# Patient Record
Sex: Female | Born: 1985 | Race: Black or African American | Hispanic: No | Marital: Single | Smoking: Never smoker
Health system: Southern US, Community
[De-identification: ages and names within clinical notes are randomized; demographics above are authoritative.]

## PROBLEM LIST (undated history)

## (undated) ENCOUNTER — Inpatient Hospital Stay (HOSPITAL_COMMUNITY): Payer: Self-pay

## (undated) DIAGNOSIS — O139 Gestational [pregnancy-induced] hypertension without significant proteinuria, unspecified trimester: Secondary | ICD-10-CM

## (undated) DIAGNOSIS — R002 Palpitations: Secondary | ICD-10-CM

## (undated) DIAGNOSIS — D649 Anemia, unspecified: Secondary | ICD-10-CM

## (undated) DIAGNOSIS — G35D Multiple sclerosis, unspecified: Secondary | ICD-10-CM

## (undated) DIAGNOSIS — I10 Essential (primary) hypertension: Secondary | ICD-10-CM

## (undated) DIAGNOSIS — G35 Multiple sclerosis: Secondary | ICD-10-CM

## (undated) HISTORY — PX: NO PAST SURGERIES: SHX2092

---
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
## 2011-08-21 ENCOUNTER — Encounter (HOSPITAL_COMMUNITY): Payer: Self-pay | Admitting: *Deleted

## 2011-08-21 ENCOUNTER — Emergency Department (HOSPITAL_COMMUNITY)
Admission: EM | Admit: 2011-08-21 | Discharge: 2011-08-21 | Disposition: A | Discharge status: Home / Self Care | Payer: Self-pay | Attending: Emergency Medicine | Admitting: Emergency Medicine

## 2011-08-21 ENCOUNTER — Emergency Department (HOSPITAL_COMMUNITY): Payer: Self-pay

## 2011-08-21 DIAGNOSIS — R0602 Shortness of breath: Secondary | ICD-10-CM | POA: Insufficient documentation

## 2011-08-21 DIAGNOSIS — R079 Chest pain, unspecified: Secondary | ICD-10-CM | POA: Insufficient documentation

## 2011-08-21 DIAGNOSIS — K219 Gastro-esophageal reflux disease without esophagitis: Secondary | ICD-10-CM | POA: Insufficient documentation

## 2011-08-21 DIAGNOSIS — R1013 Epigastric pain: Secondary | ICD-10-CM | POA: Insufficient documentation

## 2011-08-21 HISTORY — DX: Essential (primary) hypertension: I10

## 2011-08-21 LAB — CBC WITH DIFFERENTIAL/PLATELET
Eosinophils Relative: 1 % (ref 0–5)
Hemoglobin: 11 g/dL — ABNORMAL LOW (ref 12.0–15.0)
Lymphocytes Relative: 23 % (ref 12–46)
Lymphs Abs: 1.5 10*3/uL (ref 0.7–4.0)
MCV: 84.3 fL (ref 78.0–100.0)
Monocytes Relative: 13 % — ABNORMAL HIGH (ref 3–12)
Neutrophils Relative %: 63 % (ref 43–77)
Platelets: 309 10*3/uL (ref 150–400)
RBC: 3.88 MIL/uL (ref 3.87–5.11)
WBC: 6.3 10*3/uL (ref 4.0–10.5)

## 2011-08-21 LAB — COMPREHENSIVE METABOLIC PANEL
ALT: 10 U/L (ref 0–35)
Alkaline Phosphatase: 59 U/L (ref 39–117)
CO2: 27 mEq/L (ref 19–32)
GFR calc Af Amer: >90 mL/min (ref >90)
GFR calc non Af Amer: >90 mL/min (ref >90)
Glucose, Bld: 91 mg/dL (ref 70–99)
Potassium: 3.6 mEq/L (ref 3.5–5.1)
Sodium: 138 mEq/L (ref 135–145)

## 2011-08-21 MED ORDER — RANITIDINE HCL 150 MG PO TABS: 150.0000 mg | ORAL_TABLET | Freq: Two times a day (BID) | ORAL | Status: DC

## 2011-08-21 MED ORDER — GI COCKTAIL ~~LOC~~
30.0000 mL | Freq: Once | ORAL | Status: AC
  Administered 2011-08-21: 30 mL via ORAL
  Filled 2011-08-21: qty 30

## 2011-08-21 NOTE — ED Notes (Addendum)
Pt reports shortness of breath, sore throat x couple of days. Worse with exertion. Denies cough. Pt speaking in complete sentences, no distress noted. No hx of asthma.

## 2011-08-21 NOTE — ED Provider Notes (Addendum)
History     CSN: [id number]  Arrival date & time 08/21/11  1520   First MD Initiated Contact with Patient 08/21/11 1620      Chief Complaint  Patient presents with  . Shortness of Breath    (Consider location/radiation/quality/duration/timing/severity/associated sxs/prior treatment) Patient is a 26 y.o. female presenting with shortness of breath. The history is provided by the patient.  Shortness of Breath  The current episode started 2 days ago. The onset was gradual. The problem occurs occasionally. The problem has been unchanged. The problem is moderate. Nothing relieves the symptoms. The symptoms are aggravated by a supine position (worse in the morning when she gets up in the morning). Associated symptoms include shortness of breath. Pertinent negatives include no cough and no wheezing. Associated symptoms comments: midsternal burning. Her past medical history does not include asthma or past wheezing. Urine output has been normal. There were no sick contacts. She has received no recent medical care.    History reviewed. No pertinent past medical history.  History reviewed. No pertinent past surgical history.  No family history on file.  History  Substance Use Topics  . Smoking status: Never Smoker   . Smokeless tobacco: Not on file  . Alcohol Use: Yes    OB History    Grav Para Term Preterm Abortions TAB SAB Ect Mult Living                  Review of Systems  Respiratory: Positive for shortness of breath. Negative for cough and wheezing.   Genitourinary: Positive for vaginal bleeding.       Started menses today.  States she has very heavy bleeding.  All other systems reviewed and are negative.    Allergies  Review of patient's allergies indicates no known allergies.  Home Medications   Current Outpatient Rx  Name Route Sig Dispense Refill  . ASPIRIN-ACETAMINOPHEN-CAFFEINE 250-250-65 MG PO TABS Oral Take 1 tablet by mouth every 6 (six) hours as needed. For  pain      BP 141/90  Pulse 92  Temp 98.5 F (36.9 C) (Oral)  Resp 16  SpO2 98%  Physical Exam  Nursing note and vitals reviewed. Constitutional: She is oriented to person, place, and time. She appears well-developed and well-nourished. No distress.  HENT:  Head: Normocephalic and atraumatic.  Mouth/Throat: No oropharyngeal exudate, posterior oropharyngeal edema or posterior oropharyngeal erythema.  Eyes: EOM are normal. Pupils are equal, round, and reactive to light.  Cardiovascular: Normal rate, regular rhythm, normal heart sounds and intact distal pulses.  Exam reveals no friction rub.   No murmur heard. Pulmonary/Chest: Effort normal and breath sounds normal. She has no wheezes. She has no rales.  Abdominal: Soft. Bowel sounds are normal. She exhibits no distension. There is tenderness in the epigastric area. There is no rebound and no guarding.       Mild epigastric tenderness  Musculoskeletal: Normal range of motion. She exhibits no tenderness.       No edema  Neurological: She is alert and oriented to person, place, and time. No cranial nerve deficit.  Skin: Skin is warm and dry. No rash noted.  Psychiatric: She has a normal mood and affect. Her behavior is normal.    ED Course  Procedures (including critical care time)  Labs Reviewed  CBC WITH DIFFERENTIAL - Abnormal; Notable for the following:    Hemoglobin 11.0 (*)     HCT 32.7 (*)     Monocytes Relative 13 (*)  All other components within normal limits  COMPREHENSIVE METABOLIC PANEL - Abnormal; Notable for the following:    Total Bilirubin 0.2 (*)     All other components within normal limits   Dg Chest 2 View  08/21/2011   *RADIOLOGY REPORT*  Clinical Data: Chest pain  CHEST - 2 VIEW  Comparison:  None.  Findings:  The heart size and mediastinal contours are within normal limits.  Both lungs are clear.  The visualized skeletal structures are unremarkable.  IMPRESSION: No active cardiopulmonary disease.   Original Report Authenticated By: Judie Petit. Ruel Favors, M.D.     Date: 08/21/2011  Rate: 73  Rhythm: normal sinus rhythm  QRS Axis: normal  Intervals: normal  ST/T Wave abnormalities: normal  Conduction Disutrbances: none  Narrative Interpretation: unremarkable      1. GERD (gastroesophageal reflux disease)       MDM   Pt with sx most consistent with acid reflux.  He started 2 days ago and it is worse in the morning she has and a bad taste in her mouth when she wakes up. The pain will intermittently, shortness of breath and she feels that there's a lump in her throat. She does complain of some mild congestion but no other infectious symptoms. No true sore throat and no signs of pharyngitis on exam. She is in no distress and has normal vital signs. Mild epigastric tenderness but no right upper quadrant or left upper quadrant tenderness.  Patient also states that she is very heavy menses which may also be the cause of her shortness of breath. CBC, CMP, EKG, chest x-ray pending. Patient given a GI cocktail.  6:14 PM Labs wnl.  Pt feeling much better.  Pt d/ced home with zantac and GERD percautions.      Gwyneth Sprout, MD 08/21/11 1610  Gwyneth Sprout, MD 08/21/11 [id number]

## 2011-12-26 ENCOUNTER — Encounter (HOSPITAL_COMMUNITY): Payer: Self-pay | Admitting: Emergency Medicine

## 2011-12-26 ENCOUNTER — Emergency Department (HOSPITAL_COMMUNITY): Payer: Self-pay

## 2011-12-26 ENCOUNTER — Emergency Department (HOSPITAL_COMMUNITY)
Admission: EM | Admit: 2011-12-26 | Discharge: 2011-12-26 | Disposition: A | Discharge status: Home / Self Care | Payer: Self-pay | Attending: Emergency Medicine | Admitting: Emergency Medicine

## 2011-12-26 DIAGNOSIS — Z79899 Other long term (current) drug therapy: Secondary | ICD-10-CM | POA: Insufficient documentation

## 2011-12-26 DIAGNOSIS — S62629A Displaced fracture of medial phalanx of unspecified finger, initial encounter for closed fracture: Secondary | ICD-10-CM

## 2011-12-26 DIAGNOSIS — IMO0002 Reserved for concepts with insufficient information to code with codable children: Secondary | ICD-10-CM | POA: Insufficient documentation

## 2011-12-26 DIAGNOSIS — W2209XA Striking against other stationary object, initial encounter: Secondary | ICD-10-CM | POA: Insufficient documentation

## 2011-12-26 DIAGNOSIS — I10 Essential (primary) hypertension: Secondary | ICD-10-CM | POA: Insufficient documentation

## 2011-12-26 DIAGNOSIS — Y939 Activity, unspecified: Secondary | ICD-10-CM | POA: Insufficient documentation

## 2011-12-26 DIAGNOSIS — Y929 Unspecified place or not applicable: Secondary | ICD-10-CM | POA: Insufficient documentation

## 2011-12-26 DIAGNOSIS — Z7982 Long term (current) use of aspirin: Secondary | ICD-10-CM | POA: Insufficient documentation

## 2011-12-26 NOTE — ED Notes (Signed)
Ortho called 

## 2011-12-26 NOTE — ED Notes (Signed)
Pt states 2 days ago she struck her left hand on the edge of the table and it has progressively gotten worse over the past 2 days  Pt is unable to move her middle finger on the left hand

## 2011-12-26 NOTE — ED Provider Notes (Signed)
History     CSN: [id number]  Arrival date & time 12/26/11  8657   First MD Initiated Contact with Patient 12/26/11 [phone]      Chief Complaint  Patient presents with  . Hand Injury    (Consider location/radiation/quality/duration/timing/severity/associated sxs/prior treatment) HPI Comments: Amanda Wise presents for evaluation of pain and swelling of Amanda Wise left middle finger.  She states she was swinging Amanda Wise arm and inadvertently struck the side of the finger against a hard table edge 2 days ago.  She initially thought it was just a superficial bruise however the pain and swelling have persisted.  She denies any other injuries and states the pain is limited to the finger and specifically to the area around the middle joint of the finger.  Patient is a 26 y.o. female presenting with hand injury. The history is provided by the patient. No language interpreter was used.  Hand Injury  The incident occurred 2 days ago. The incident occurred at home. The injury mechanism was a direct blow. The pain is present in the left fingers (middle finger). The quality of the pain is described as throbbing and sharp. The pain is at a severity of 10/10. The pain is severe (localized). Pertinent negatives include no fever and no malaise/fatigue. She reports no foreign bodies present. The symptoms are aggravated by movement, use and palpation. She has tried nothing for the symptoms.    Past Medical History  Diagnosis Date  . Hypertension     History reviewed. No pertinent past surgical history.  Family History  Problem Relation Age of Onset  . Hypertension Other     History  Substance Use Topics  . Smoking status: Never Smoker   . Smokeless tobacco: Not on file  . Alcohol Use: Yes    OB History    Grav Para Term Preterm Abortions TAB SAB Ect Mult Living                  Review of Systems  Constitutional: Negative for fever and malaise/fatigue.  All other systems reviewed and are  negative.    Allergies  Review of patient's allergies indicates no known allergies.  Home Medications   Current Outpatient Rx  Name  Route  Sig  Dispense  Refill  . ACETAMINOPHEN 500 MG PO TABS   Oral   Take 1,000 mg by mouth every 6 (six) hours as needed. Pain         . ASPIRIN-ACETAMINOPHEN-CAFFEINE 250-250-65 MG PO TABS   Oral   Take 1 tablet by mouth every 6 (six) hours as needed. For pain         . RANITIDINE HCL 150 MG PO TABS   Oral   Take 150 mg by mouth 2 (two) times daily.           BP 138/82  Pulse 69  Temp 98.1 F (36.7 C) (Oral)  Resp 16  SpO2 99%  LMP 12/02/2011  Physical Exam  Nursing note and vitals reviewed. Constitutional: She appears well-developed and well-nourished. No distress.  HENT:  Head: Normocephalic and atraumatic.  Right Ear: External ear normal.  Left Ear: External ear normal.  Mouth/Throat: Oropharynx is clear and moist.  Eyes: Conjunctivae normal are normal. Pupils are equal, round, and reactive to light. Right eye exhibits no discharge. Left eye exhibits no discharge. No scleral icterus.  Neck: Neck supple. No JVD present. No tracheal deviation present.  Cardiovascular: Normal rate, regular rhythm, normal heart sounds and intact distal pulses.  Exam reveals no gallop and no friction rub.   No murmur heard. Pulmonary/Chest: Breath sounds normal. No respiratory distress. She has no wheezes. She has no rales.  Abdominal: Soft. Bowel sounds are normal. There is no tenderness.  Musculoskeletal: She exhibits tenderness.       Left hand: She exhibits decreased range of motion, tenderness, bony tenderness and swelling. She exhibits normal capillary refill and no laceration. Decreased sensation is not present in the ulnar distribution, is not present in the medial redistribution and is not present in the radial distribution.       Hands:      Tenderness and swelling at the left proximal interphalangeal joint.  No obvious deformity.   Diminished ROM observed secondary to pain and swelling.  No tenderness appreciated of the distal or proximal finger.  Neurological: She is alert.  Skin: Skin is warm and dry. No rash noted. She is not diaphoretic. No erythema. No pallor.  Psychiatric: She has a normal mood and affect. Amanda Wise behavior is normal.    ED Course  Procedures (including critical care time)  Labs Reviewed - No data to display No results found.   No diagnosis found.    MDM  Pt presents with pain and swelling of Amanda Wise left middle finger after striking it on the edge of a table.  She has the most swelling and pain at the proximal interphalangeal joint.  Will obtain x-rays of the finger to assess for fracture or dislocation.  There are no skin breaks, fluctuant areas, sausage-like digital swelling, pain with palpation over the extensor or flexor compartments of the forearm, or cellulitis-like skin changes.  0930.  Pt stable, NAD.  Note small avulsion fracture of the medial base of the 3rd middle phalanx.  This corresponds to the tenderness encountered on exam.  Will have a splint placed, Plan outpt follow-up with ortho/hand specialist.     Tobin Chad, MD 12/26/11 [phone]

## 2012-02-28 ENCOUNTER — Encounter (INDEPENDENT_AMBULATORY_CARE_PROVIDER_SITE_OTHER): Payer: BC Managed Care – PPO | Admitting: Ophthalmology

## 2012-02-28 DIAGNOSIS — S0510XA Contusion of eyeball and orbital tissues, unspecified eye, initial encounter: Secondary | ICD-10-CM

## 2012-02-28 DIAGNOSIS — H43819 Vitreous degeneration, unspecified eye: Secondary | ICD-10-CM

## 2012-02-28 DIAGNOSIS — H31009 Unspecified chorioretinal scars, unspecified eye: Secondary | ICD-10-CM

## 2012-06-09 ENCOUNTER — Emergency Department (HOSPITAL_COMMUNITY)
Admission: EM | Admit: 2012-06-09 | Discharge: 2012-06-09 | Disposition: A | Discharge status: Home / Self Care | Payer: BC Managed Care – PPO | Attending: Emergency Medicine | Admitting: Emergency Medicine

## 2012-06-09 ENCOUNTER — Encounter (HOSPITAL_COMMUNITY): Payer: Self-pay | Admitting: Emergency Medicine

## 2012-06-09 DIAGNOSIS — R002 Palpitations: Secondary | ICD-10-CM | POA: Insufficient documentation

## 2012-06-09 DIAGNOSIS — I10 Essential (primary) hypertension: Secondary | ICD-10-CM | POA: Insufficient documentation

## 2012-06-09 DIAGNOSIS — Z79899 Other long term (current) drug therapy: Secondary | ICD-10-CM | POA: Insufficient documentation

## 2012-06-09 LAB — POCT I-STAT, CHEM 8
Calcium, Ion: 1.14 mmol/L (ref 1.12–1.23)
HCT: 34 % — ABNORMAL LOW (ref 36.0–46.0)
Hemoglobin: 11.6 g/dL — ABNORMAL LOW (ref 12.0–15.0)
TCO2: 26 mmol/L (ref 0–100)

## 2012-06-09 LAB — POCT I-STAT TROPONIN I: Troponin i, poc: 0 ng/mL (ref 0.00–0.08)

## 2012-06-09 NOTE — ED Provider Notes (Signed)
Medical screening examination/treatment/procedure(s) were performed by non-physician practitioner and as supervising physician I was immediately available for consultation/collaboration.  Doug Sou, MD 06/09/12 [phone]

## 2012-06-09 NOTE — ED Notes (Signed)
Pt c/o chest and shoulder pain that started two day ago. Pt now sts pain is radiating down left arm with palpations.

## 2012-06-09 NOTE — ED Provider Notes (Signed)
History     CSN: [id number]  Arrival date & time 06/09/12  0422   First MD Initiated Contact with Patient 06/09/12 0440      Chief Complaint  Patient presents with  . Chest Pain    (Consider location/radiation/quality/duration/timing/severity/associated sxs/prior treatment) HPI Comments: Amanda Wise is a 27 y.o. obese female with a history of hypertension presents emergency department complaining of palpitations.  Onset of symptoms began today and lasted approximately 5 minutes.  Patient has had a total of 3 or 4 episodes.  She is not currently having any palpitations.  She denies any syncope, lightheadedness, dizziness, chest pain, shortness of breath, leg swelling.  In addition patient reports that she's had a nonproductive cough times one week.  Brother has similar symptoms.  She denies fevers, night sweats, chills.  Lastly patient reports intermittent left arm pain that is described as left shoulder muscular soreness.  Patient denies any trauma, numbness, tingling or weakness of extremity.  She reports that she thinks she slept wrong a while back and it has bothered her since.   Past Medical History  Diagnosis Date  . Hypertension     History reviewed. No pertinent past surgical history.  Family History  Problem Relation Age of Onset  . Hypertension Other     History  Substance Use Topics  . Smoking status: Never Smoker   . Smokeless tobacco: Not on file  . Alcohol Use: No    OB History   Grav Para Term Preterm Abortions TAB SAB Ect Mult Living                  Review of Systems  All other systems reviewed and are negative.    Allergies  Review of patient's allergies indicates no known allergies.  Home Medications   Current Outpatient Rx  Name  Route  Sig  Dispense  Refill  . acetaminophen (TYLENOL) 500 MG tablet   Oral   Take 1,000 mg by mouth every 6 (six) hours as needed. Pain         . aspirin-acetaminophen-caffeine (EXCEDRIN MIGRAINE) 250-250-65  MG per tablet   Oral   Take 1 tablet by mouth every 6 (six) hours as needed. For pain         . ranitidine (ZANTAC) 150 MG tablet   Oral   Take 150 mg by mouth 2 (two) times daily.           BP 143/83  Pulse 80  Temp(Src) 98.6 F (37 C) (Oral)  Resp 18  Ht 5\' 6"  (1.676 m)  Wt 300 lb (136.079 kg)  BMI 48.44 kg/m2  SpO2 97%  LMP 05/19/2012  Physical Exam  Nursing note and vitals reviewed. Constitutional: She appears well-developed and well-nourished. No distress.  HENT:  Head: Normocephalic and atraumatic.  Eyes: Conjunctivae and EOM are normal. Pupils are equal, round, and reactive to light.  Neck: Normal range of motion. Neck supple. Normal carotid pulses and no JVD present. Carotid bruit is not present. No rigidity. Normal range of motion present.  Cardiovascular: Normal rate, regular rhythm, S1 normal, S2 normal, normal heart sounds, intact distal pulses and normal pulses.  Exam reveals no gallop and no friction rub.   No murmur heard. No pitting edema bilaterally, RRR, no aberrant sounds on auscultations, distal pulses intact, no carotid bruit or JVD.   Pulmonary/Chest: Effort normal and breath sounds normal. No accessory muscle usage or stridor. No respiratory distress. She exhibits no tenderness and no bony tenderness.  Abdominal: Bowel sounds are normal.  Morbidly obese. Soft non tender. Non pulsatile aorta.   Skin: Skin is warm, dry and intact. No rash noted. She is not diaphoretic. No cyanosis. Nails show no clubbing.    ED Course  Procedures (including critical care time)  Labs Reviewed  POCT I-STAT, CHEM 8 - Abnormal; Notable for the following:    Hemoglobin 11.6 (*)    HCT 34.0 (*)    All other components within normal limits  POCT I-STAT TROPONIN I   No results found.  Date: 06/09/2012  Rate: 55  Rhythm: normal sinus rhythm  QRS Axis: normal  Intervals: normal  ST/T Wave abnormalities: normal  Conduction Disutrbances: none  Narrative  Interpretation:   Old EKG Reviewed: No old  No diagnosis found.    MDM  Palpitations 27 year old female presents emergency room complaining of intermittent palpitations.  EKG reviewed with no ST elevation, troponin and chem 8 within normal limits.  Chest pain free throughout the emergency department visit.  Patient denies risk factors including early family cardiac, smoking, and cocaine use. Low concern for ACS etiology.  Patient advised to followup with primary care Dr. for today's presentation.  Discuss possible Holter monitor to catch a regular rhythm his symptoms persist.  Strict return precautions discussed.  Patient verbalizes understanding and agrees with plan.       Jaci Carrel, [place] 06/09/12 [phone]

## 2014-02-13 ENCOUNTER — Emergency Department (HOSPITAL_COMMUNITY)
Admission: EM | Admit: 2014-02-13 | Discharge: 2014-02-13 | Disposition: A | Discharge status: Home / Self Care | Payer: Self-pay | Attending: Emergency Medicine | Admitting: Emergency Medicine

## 2014-02-13 ENCOUNTER — Emergency Department (HOSPITAL_COMMUNITY): Payer: Self-pay

## 2014-02-13 ENCOUNTER — Encounter (HOSPITAL_COMMUNITY): Payer: Self-pay | Admitting: Emergency Medicine

## 2014-02-13 DIAGNOSIS — Z3202 Encounter for pregnancy test, result negative: Secondary | ICD-10-CM | POA: Insufficient documentation

## 2014-02-13 DIAGNOSIS — Z79899 Other long term (current) drug therapy: Secondary | ICD-10-CM | POA: Insufficient documentation

## 2014-02-13 DIAGNOSIS — I10 Essential (primary) hypertension: Secondary | ICD-10-CM | POA: Insufficient documentation

## 2014-02-13 DIAGNOSIS — R0602 Shortness of breath: Secondary | ICD-10-CM | POA: Insufficient documentation

## 2014-02-13 DIAGNOSIS — R200 Anesthesia of skin: Secondary | ICD-10-CM | POA: Insufficient documentation

## 2014-02-13 LAB — CBC WITH DIFFERENTIAL/PLATELET
Basophils Absolute: 0 K/uL (ref 0.0–0.1)
Basophils Relative: 1 % (ref 0–1)
Eosinophils Absolute: 0.2 K/uL (ref 0.0–0.7)
Eosinophils Relative: 2 % (ref 0–5)
HCT: 33.7 % — ABNORMAL LOW (ref 36.0–46.0)
Hemoglobin: 10.9 g/dL — ABNORMAL LOW (ref 12.0–15.0)
Lymphocytes Relative: 31 % (ref 12–46)
Lymphs Abs: 2.1 K/uL (ref 0.7–4.0)
MCH: 27.7 pg (ref 26.0–34.0)
MCHC: 32.3 g/dL (ref 30.0–36.0)
MCV: 85.8 fL (ref 78.0–100.0)
Monocytes Absolute: 0.6 K/uL (ref 0.1–1.0)
Monocytes Relative: 9 % (ref 3–12)
Neutro Abs: 3.9 K/uL (ref 1.7–7.7)
Neutrophils Relative %: 57 % (ref 43–77)
Platelets: 292 K/uL (ref 150–400)
RBC: 3.93 MIL/uL (ref 3.87–5.11)
RDW: 14.5 % (ref 11.5–15.5)
WBC: 6.8 K/uL (ref 4.0–10.5)

## 2014-02-13 LAB — URINALYSIS, ROUTINE W REFLEX MICROSCOPIC
Bilirubin Urine: NEGATIVE
Glucose, UA: NEGATIVE mg/dL
Ketones, ur: NEGATIVE mg/dL
LEUKOCYTES UA: NEGATIVE
Nitrite: NEGATIVE
PROTEIN: NEGATIVE mg/dL
SPECIFIC GRAVITY, URINE: 1.034 — AB (ref 1.005–1.030)
UROBILINOGEN UA: 1 mg/dL (ref 0.0–1.0)
pH: 6 (ref 5.0–8.0)

## 2014-02-13 LAB — COMPREHENSIVE METABOLIC PANEL WITH GFR
ALT: 20 U/L (ref 0–35)
AST: 25 U/L (ref 0–37)
Albumin: 4 g/dL (ref 3.5–5.2)
Alkaline Phosphatase: 62 U/L (ref 39–117)
Anion gap: 6 (ref 5–15)
BUN: 13 mg/dL (ref 6–23)
CO2: 24 mmol/L (ref 19–32)
Calcium: 8.9 mg/dL (ref 8.4–10.5)
Chloride: 109 meq/L (ref 96–112)
Creatinine, Ser: 0.86 mg/dL (ref 0.50–1.10)
GFR calc Af Amer: >90 mL/min
GFR calc non Af Amer: 90 mL/min — ABNORMAL LOW
Glucose, Bld: 89 mg/dL (ref 70–99)
Potassium: 4.2 mmol/L (ref 3.5–5.1)
Sodium: 139 mmol/L (ref 135–145)
Total Bilirubin: 0.4 mg/dL (ref 0.3–1.2)
Total Protein: 8.1 g/dL (ref 6.0–8.3)

## 2014-02-13 LAB — URINE MICROSCOPIC-ADD ON

## 2014-02-13 LAB — TSH: TSH: 0.562 u[IU]/mL (ref 0.350–4.500)

## 2014-02-13 LAB — PREGNANCY, URINE: Preg Test, Ur: NEGATIVE

## 2014-02-13 NOTE — ED Provider Notes (Signed)
CSN: [id number]     Arrival date & time 02/13/14  0347 History   First MD Initiated Contact with Patient 02/13/14 0355     Chief Complaint  Patient presents with  . Shortness of Breath  . Numbness    both legs    The history is provided by the patient.   patient presents complaining of shortness of breath and intermittent numbness of her bilateral lower extremities.  She denies swelling of her lower extremities.  She reports that the shortness of breath is intermittent as well.  She denies orthopnea.  No current dyspnea.  Reports no dyspnea on exertion.  Reports her main reason for coming in was when she was at work this evening she felt tingling and numbness in both of her legs and this concerned her.  She does not have a primary care physician.  She denies fevers or chills.  No productive cough.  No history DVT or pulmonary embolism.  Symptoms are mild and intermittent.  Currently she is asymptomatic.  She feels stronger legs and denies weakness.  Past Medical History  Diagnosis Date  . Hypertension    History reviewed. No pertinent past surgical history. Family History  Problem Relation Age of Onset  . Hypertension Other    History  Substance Use Topics  . Smoking status: Never Smoker   . Smokeless tobacco: Not on file  . Alcohol Use: No   OB History    No data available     Review of Systems  All other systems reviewed and are negative.     Allergies  Review of patient's allergies indicates no known allergies.  Home Medications   Prior to Admission medications   Medication Sig Start Date End Date Taking? Authorizing Provider  acetaminophen (TYLENOL) 500 MG tablet Take 1,000 mg by mouth every 6 (six) hours as needed. Pain   Yes Historical Provider, MD  Multiple Vitamin (MULTIVITAMIN WITH MINERALS) TABS tablet Take 1 tablet by mouth daily.   Yes Historical Provider, MD  ferrous sulfate 325 (65 FE) MG tablet Take 325 mg by mouth daily with breakfast.    Historical  Provider, MD   BP 145/91 mmHg  Pulse 80  Temp(Src) 98.3 F (36.8 C) (Oral)  Resp 13  Ht  (1.702 m)  Wt 315 lb (142.883 kg)  BMI 49.32 kg/m2  SpO2 100%  LMP 01/30/2014 Physical Exam  Constitutional: She is oriented to person, place, and time. She appears well-developed and well-nourished. No distress.  HENT:  Head: Normocephalic and atraumatic.  Eyes: EOM are normal.  Neck: Normal range of motion.  Cardiovascular: Normal rate, regular rhythm and normal heart sounds.   Pulmonary/Chest: Effort normal and breath sounds normal.  Abdominal: Soft. She exhibits no distension. There is no tenderness.  Musculoskeletal: Normal range of motion. She exhibits no edema or tenderness.  Normal PT and DP pulses bilaterally.  Full range of motion bilateral ankles knees and hips.  Neurological: She is alert and oriented to person, place, and time.  Skin: Skin is warm and dry.  Psychiatric: She has a normal mood and affect. Judgment normal.  Nursing note and vitals reviewed.   ED Course  Procedures (including critical care time) Labs Review Labs Reviewed  CBC WITH DIFFERENTIAL - Abnormal; Notable for the following:    Hemoglobin 10.9 (*)    HCT 33.7 (*)    All other components within normal limits  COMPREHENSIVE METABOLIC PANEL - Abnormal; Notable for the following:    GFR calc  non Af Amer 90 (*)    All other components within normal limits  URINALYSIS, ROUTINE W REFLEX MICROSCOPIC - Abnormal; Notable for the following:    APPearance CLOUDY (*)    Specific Gravity, Urine 1.034 (*)    Hgb urine dipstick MODERATE (*)    All other components within normal limits  URINE MICROSCOPIC-ADD ON - Abnormal; Notable for the following:    Squamous Epithelial / LPF MANY (*)    Bacteria, UA MANY (*)    All other components within normal limits  PREGNANCY, URINE  TSH    Imaging Review Dg Chest 2 View  02/13/2014   CLINICAL DATA:  Shortness of breath and numbness to both lower leg starting at  0145 hr this morning. Numbness and tingling in both legs. Shortness of breath for 2 weeks. Nonsmoker.  EXAM: CHEST  2 VIEW  COMPARISON:  08/21/2011  FINDINGS: Shallow inspiration with segmental elevation of the left hemidiaphragm. Normal heart size and pulmonary vascularity. No focal airspace disease or consolidation in the lungs. No blunting of costophrenic angles. No pneumothorax. Mediastinal contours appear intact. No change since prior study.  IMPRESSION: No active cardiopulmonary disease.   Electronically Signed   By: Burman Nieves M.D.   On: 02/13/2014 06:53  I personally reviewed the imaging tests through PACS system I reviewed available ER/hospitalization records through the EMR    EKG Interpretation   Date/Time:  Saturday February 13 2014 04:20:58 EST Ventricular Rate:  87 PR Interval:  169 QRS Duration: 91 QT Interval:  349 QTC Calculation: 420 R Axis:   37 Text Interpretation:  Sinus rhythm Borderline T abnormalities, inferior  leads No significant change was found Confirmed by Wm Fruchter  MD, Aylan Bayona  ([id number]) on 02/13/2014 6:58:36 AM      MDM   Final diagnoses:  SOB (shortness of breath)    Overall the patient is well-appearing.  Patient is PERC negative.  Normal strength in her bilateral lower extremities.  Recommended to follow-up with the primary care physician.  Resources given.    Lyanne Co, MD 02/14/14 [facility]ers

## 2014-02-13 NOTE — Discharge Instructions (Signed)

## 2014-02-13 NOTE — ED Notes (Signed)
Patient transported to X-ray 

## 2014-02-13 NOTE — ED Notes (Signed)
Pt. Came in to ED POV with complaint of SOB and numbness of bil. Lower legs which started at 0145 this morning while talking her break from work. Pt. Stated that she was walking in their break room and felt tingling and numbness on both legs, denied pain. Denies N/V.

## 2014-08-26 LAB — OB RESULTS CONSOLE GC/CHLAMYDIA
CHLAMYDIA, DNA PROBE: NEGATIVE
GC PROBE AMP, GENITAL: NEGATIVE

## 2014-08-27 LAB — OB RESULTS CONSOLE RPR: RPR: NONREACTIVE

## 2014-08-27 LAB — OB RESULTS CONSOLE HIV ANTIBODY (ROUTINE TESTING): HIV: NONREACTIVE

## 2014-08-27 LAB — OB RESULTS CONSOLE RUBELLA ANTIBODY, IGM: RUBELLA: IMMUNE

## 2014-08-27 LAB — OB RESULTS CONSOLE HEPATITIS B SURFACE ANTIGEN: HEP B S AG: NEGATIVE

## 2014-11-30 ENCOUNTER — Other Ambulatory Visit (HOSPITAL_COMMUNITY): Payer: Self-pay | Admitting: Obstetrics and Gynecology

## 2014-11-30 DIAGNOSIS — IMO0002 Reserved for concepts with insufficient information to code with codable children: Secondary | ICD-10-CM

## 2014-11-30 DIAGNOSIS — Z0489 Encounter for examination and observation for other specified reasons: Secondary | ICD-10-CM

## 2014-12-07 ENCOUNTER — Ambulatory Visit (HOSPITAL_COMMUNITY)
Admission: RE | Admit: 2014-12-07 | Discharge: 2014-12-07 | Disposition: A | Discharge status: Home / Self Care | Payer: Medicaid Other | Source: Ambulatory Visit | Attending: Obstetrics and Gynecology | Admitting: Obstetrics and Gynecology

## 2014-12-07 DIAGNOSIS — Z0489 Encounter for examination and observation for other specified reasons: Secondary | ICD-10-CM

## 2014-12-07 DIAGNOSIS — IMO0002 Reserved for concepts with insufficient information to code with codable children: Secondary | ICD-10-CM

## 2014-12-07 DIAGNOSIS — Z3A23 23 weeks gestation of pregnancy: Secondary | ICD-10-CM | POA: Diagnosis not present

## 2014-12-07 DIAGNOSIS — Z36 Encounter for antenatal screening of mother: Secondary | ICD-10-CM | POA: Insufficient documentation

## 2014-12-07 DIAGNOSIS — O99210 Obesity complicating pregnancy, unspecified trimester: Secondary | ICD-10-CM | POA: Insufficient documentation

## 2015-01-10 ENCOUNTER — Inpatient Hospital Stay (HOSPITAL_COMMUNITY)
Admission: AD | Admit: 2015-01-10 | Discharge: 2015-01-10 | Disposition: A | Discharge status: Home / Self Care | Payer: Medicaid Other | Source: Ambulatory Visit | Attending: Obstetrics and Gynecology | Admitting: Obstetrics and Gynecology

## 2015-01-10 DIAGNOSIS — O368130 Decreased fetal movements, third trimester, not applicable or unspecified: Secondary | ICD-10-CM | POA: Insufficient documentation

## 2015-01-10 DIAGNOSIS — Z3A29 29 weeks gestation of pregnancy: Secondary | ICD-10-CM | POA: Insufficient documentation

## 2015-01-10 NOTE — Discharge Instructions (Signed)

## 2015-01-10 NOTE — MAU Provider Note (Signed)
History    Amanda Wise is a 28y.o. G1P0 at 29.5wks who presents, unannounced, for no fetal movement.  Patient states she last detected movement around 1900.  Patient reports eating pasta, fruit juice, and zebra cakes with no movement.  Patient further reports resting on left side and drinking ice water with no results.  Patient states she reported to MAU at this time.  Patient denies LOF, VB, and ctx.    There are no active problems to display for this patient.   No chief complaint on file.  HPI  OB History    No data available      Past Medical History  Diagnosis Date  . Hypertension     No past surgical history on file.  Family History  Problem Relation Age of Onset  . Hypertension Other     Social History  Substance Use Topics  . Smoking status: Never Smoker   . Smokeless tobacco: Not on file  . Alcohol Use: No    Allergies: No Known Allergies  Prescriptions prior to admission  Medication Sig Dispense Refill Last Dose  . acetaminophen (TYLENOL) 500 MG tablet Take 1,000 mg by mouth every 6 (six) hours as needed. Pain   Past Month at Unknown time  . ferrous sulfate 325 (65 FE) MG tablet Take 325 mg by mouth daily with breakfast.   unknown at unknown  . Multiple Vitamin (MULTIVITAMIN WITH MINERALS) TABS tablet Take 1 tablet by mouth daily.   02/12/2014 at Unknown time    ROS  See HPI Above Physical Exam   Blood pressure 134/72, pulse 103, temperature 97.9 F (36.6 C), temperature source Oral, resp. rate 16, height 5\' 7"  (1.702 m), weight 151.048 kg (333 lb).  No results found for this or any previous visit (from the past 24 hour(s)).  Physical Exam  Vitals reviewed. Constitutional: She is oriented to person, place, and time. She appears well-developed and well-nourished.  Obese   HENT:  Head: Normocephalic and atraumatic.  Eyes: EOM are normal. Pupils are equal, round, and reactive to light.  Neck: Normal range of motion.  Cardiovascular: Normal rate.    Respiratory: Effort normal.  GI: Soft.  Musculoskeletal: Normal range of motion.  Neurological: She is alert and oriented to person, place, and time.  Skin: Skin is warm and dry.     FHR:145 bpm, Mod Var, -Decels, -Accels UC: Ocassional  ED Course  Assessment: IUP at 29.5wks Cat I FT Decreased FM  Plan: -Reassuring FHT -Difficult to trace to due maternal habitus -Patient informed that steps prior to presenting was appropriate -Keep appt as scheduled: 01/11/2015 -Encouraged to call if any questions or concerns arise prior to next scheduled office visit.  -Discharged to home in stable condition  Cherre Robins CNM, MSN 01/10/2015 1:58 AM

## 2015-01-10 NOTE — MAU Note (Signed)
Pt ate dinner at 1800, had a stressful day at work. Did not feel any fetal movement since that time.  Denies any vag bleeding or discharge.

## 2015-02-06 NOTE — L&D Delivery Note (Addendum)
Delivery Note At 2:47 PM a viable female, "Aviana", was delivered via Vaginal, Spontaneous Delivery (Presentation: Right Occiput Anterior).  APGAR: 9, 9; weight 7 lb 4.2 oz (3294 g).   Placenta status: Intact, Spontaneous.  Cord: 3 vessels with the following complications: None.  Cord pH: NA  Anesthesia: Epidural--placed just prior to delivery, with minimal benefit. Episiotomy: None Lacerations: Right periurethral and 2nd degree perineal Suture Repair: 3.0 vicryl Est. Blood Loss (mL): 150  Mom to postpartum.  Baby to Couplet care / Skin to Skin. Will continue SCDs until ambulating freely.  Nigel Bridgeman 03/06/2015, 4:39 PM

## 2015-02-22 LAB — OB RESULTS CONSOLE GBS: GBS: POSITIVE

## 2015-03-03 ENCOUNTER — Inpatient Hospital Stay (HOSPITAL_COMMUNITY)
Admission: AD | Admit: 2015-03-03 | Discharge: 2015-03-08 | DRG: 775 | Disposition: A | Discharge status: Home / Self Care | Payer: Medicaid Other | Source: Intra-hospital | Attending: Obstetrics & Gynecology | Admitting: Obstetrics & Gynecology

## 2015-03-03 ENCOUNTER — Encounter (HOSPITAL_COMMUNITY): Payer: Self-pay | Admitting: *Deleted

## 2015-03-03 ENCOUNTER — Inpatient Hospital Stay (HOSPITAL_COMMUNITY): Payer: Medicaid Other

## 2015-03-03 DIAGNOSIS — O99214 Obesity complicating childbirth: Secondary | ICD-10-CM | POA: Diagnosis present

## 2015-03-03 DIAGNOSIS — Z3A37 37 weeks gestation of pregnancy: Secondary | ICD-10-CM

## 2015-03-03 DIAGNOSIS — O9A213 Injury, poisoning and certain other consequences of external causes complicating pregnancy, third trimester: Secondary | ICD-10-CM

## 2015-03-03 DIAGNOSIS — B951 Streptococcus, group B, as the cause of diseases classified elsewhere: Secondary | ICD-10-CM | POA: Diagnosis present

## 2015-03-03 DIAGNOSIS — O9902 Anemia complicating childbirth: Secondary | ICD-10-CM | POA: Diagnosis present

## 2015-03-03 DIAGNOSIS — Z6841 Body Mass Index (BMI) 40.0 and over, adult: Secondary | ICD-10-CM

## 2015-03-03 DIAGNOSIS — O289 Unspecified abnormal findings on antenatal screening of mother: Principal | ICD-10-CM | POA: Diagnosis present

## 2015-03-03 DIAGNOSIS — O368390 Maternal care for abnormalities of the fetal heart rate or rhythm, unspecified trimester, not applicable or unspecified: Secondary | ICD-10-CM | POA: Diagnosis present

## 2015-03-03 DIAGNOSIS — O288 Other abnormal findings on antenatal screening of mother: Secondary | ICD-10-CM

## 2015-03-03 DIAGNOSIS — R102 Pelvic and perineal pain: Secondary | ICD-10-CM | POA: Diagnosis present

## 2015-03-03 DIAGNOSIS — Z9181 History of falling: Secondary | ICD-10-CM

## 2015-03-03 DIAGNOSIS — O99213 Obesity complicating pregnancy, third trimester: Secondary | ICD-10-CM

## 2015-03-03 DIAGNOSIS — R600 Localized edema: Secondary | ICD-10-CM | POA: Diagnosis present

## 2015-03-03 DIAGNOSIS — O99824 Streptococcus B carrier state complicating childbirth: Secondary | ICD-10-CM | POA: Diagnosis present

## 2015-03-03 DIAGNOSIS — Z8249 Family history of ischemic heart disease and other diseases of the circulatory system: Secondary | ICD-10-CM

## 2015-03-03 DIAGNOSIS — T1490XA Injury, unspecified, initial encounter: Secondary | ICD-10-CM

## 2015-03-03 HISTORY — DX: Anemia, unspecified: D64.9

## 2015-03-03 LAB — TYPE AND SCREEN
ABO/RH(D): A POS
Antibody Screen: NEGATIVE

## 2015-03-03 LAB — CBC
HCT: 33.7 % — ABNORMAL LOW (ref 36.0–46.0)
HEMOGLOBIN: 11.5 g/dL — AB (ref 12.0–15.0)
MCH: 30.3 pg (ref 26.0–34.0)
MCHC: 34.1 g/dL (ref 30.0–36.0)
MCV: 88.7 fL (ref 78.0–100.0)
Platelets: 249 10*3/uL (ref 150–400)
RBC: 3.8 MIL/uL — ABNORMAL LOW (ref 3.87–5.11)
RDW: 14.8 % (ref 11.5–15.5)
WBC: 11.1 10*3/uL — ABNORMAL HIGH (ref 4.0–10.5)

## 2015-03-03 LAB — URINALYSIS, ROUTINE W REFLEX MICROSCOPIC
BILIRUBIN URINE: NEGATIVE
GLUCOSE, UA: NEGATIVE mg/dL
Hgb urine dipstick: NEGATIVE
KETONES UR: 15 mg/dL — AB
NITRITE: NEGATIVE
PROTEIN: 30 mg/dL — AB
Specific Gravity, Urine: >1.03 — ABNORMAL HIGH (ref 1.005–1.030)
pH: 5.5 (ref 5.0–8.0)

## 2015-03-03 LAB — OB RESULTS CONSOLE ABO/RH: RH TYPE: POSITIVE

## 2015-03-03 LAB — URINE MICROSCOPIC-ADD ON

## 2015-03-03 MED ORDER — ACETAMINOPHEN 325 MG PO TABS: 650.0000 mg | ORAL_TABLET | ORAL | Status: DC | PRN

## 2015-03-03 MED ORDER — PRENATAL MULTIVITAMIN CH
1.0000 | ORAL_TABLET | Freq: Every day | ORAL | Status: DC
  Administered 2015-03-04: 1 via ORAL
  Filled 2015-03-03 (×3): qty 1

## 2015-03-03 MED ORDER — ZOLPIDEM TARTRATE 5 MG PO TABS
5.0000 mg | ORAL_TABLET | Freq: Every evening | ORAL | Status: DC | PRN
  Administered 2015-03-04 – 2015-03-05 (×2): 5 mg via ORAL
  Filled 2015-03-03 (×2): qty 1

## 2015-03-03 MED ORDER — LACTATED RINGERS IV SOLN
INTRAVENOUS | Status: DC
  Administered 2015-03-03: 125 mL/h via INTRAVENOUS
  Administered 2015-03-04 (×3): via INTRAVENOUS

## 2015-03-03 MED ORDER — CALCIUM CARBONATE ANTACID 500 MG PO CHEW
2.0000 | CHEWABLE_TABLET | ORAL | Status: DC | PRN
  Filled 2015-03-03: qty 2

## 2015-03-03 MED ORDER — DOCUSATE SODIUM 100 MG PO CAPS
100.0000 mg | ORAL_CAPSULE | Freq: Every day | ORAL | Status: DC
  Administered 2015-03-04: 100 mg via ORAL
  Filled 2015-03-03 (×4): qty 1

## 2015-03-03 NOTE — H&P (Signed)
Amanda Wise is a 30 y.o. female, G1P0 at 37.1 weeks, presenting for MAU evaluation s/p fall.  Upon assessment patient with NR NST and sent for BPP which returned 6/8 (off for fetal breathing).  After consult with Dr. Alvino Chapel, it was determined that patient would be admitted for prolonged observation and repeat BPP in am.   Patient Active Problem List   Diagnosis Date Noted  . Status post fall 03/03/2015  . Abnormal antenatal test 03/03/2015    History of present pregnancy: Patient entered care at 14.6 weeks.   EDC of 03/23/2015 was established by Definite LMP of 06/16/2014.   Anatomy scan:  20 weeks, with normal findings.   Additional [place] evaluations:   Ultrasound: Single gestation, normal fluid, cervix measures 4.85 cm, growth equals 18th percentile, not all anatomy seen.  23.6wks: U/s today: AA, spine and cord insertion not well seen.  29.6wks: u/s today for s>d EFW 47 % AFI=19.2  31.6wks: BPP 8/8, nl fluid, vtx. 32.6wks: BPP8/8 BABY IS BREECH 33.6wks: BPP 8/8. [place]: SIUP, VERTEX, NORMAL FLUID, CX 3.13 CM, GROWTH 28%. 34.6wks: BPP 8/8, nl fluid, vtx.  35.6wks: BPP 8/8 WITH NORMAL FLUID 36.6wks: Growth U/S reviewed: EFW 2778g (45%), good interval growth, BPP 8.8, AFI 16.11cm Significant prenatal events: 1st Trimester: No concerns 2nd Trimester:No Concerns 3rd Trimester:   Patient c/o pelvic pain, vaginal pressure and pain Last evaluation:  03/01/2015 in office by L. Montez Morita, FNP  FHR 143, BP 110/80, Wt: 349lbs  OB History    Gravida Para Term Preterm AB TAB SAB Ectopic Multiple Living   1              Past Medical History  Diagnosis Date  . Hypertension   . Anemia    Past Surgical History  Procedure Laterality Date  . No past surgeries     Family History: family history includes Hypertension in her maternal grandmother, other, and paternal grandmother. Social History:  reports that she has never smoked. She has never used smokeless tobacco. She reports that she does not drink  alcohol or use illicit drugs.   Prenatal Transfer Tool  Maternal Diabetes: No Genetic Screening: Declined Maternal Ultrasounds/Referrals: Normal Fetal Ultrasounds or other Referrals:  None Maternal Substance Abuse:  No Significant Maternal Medications:  None Significant Maternal Lab Results: Lab values include: Group B Strep positive   ROS:  +FM, -Ctx, -LoF, -Vb  No Known Allergies     Blood pressure 113/80, pulse 72, temperature 98 F (36.7 C), temperature source Oral, resp. rate 18, height 5\' 7"  (1.702 m), weight 157.852 kg (348 lb).  Physical Exam  Vitals reviewed. Constitutional: She is oriented to person, place, and time.  Morbidly Obese  HENT:  Head: Normocephalic and atraumatic.  Eyes: Pupils are equal, round, and reactive to light.  Neck: Normal range of motion.  Cardiovascular: Normal rate.   Respiratory: Effort normal and breath sounds normal.  GI: Soft. Bowel sounds are normal.  Musculoskeletal: Normal range of motion.  Neurological: She is alert and oriented to person, place, and time.  Skin: Skin is warm and dry.    Leopolds: EFW: UTD d/t patient habitus Presentation: Vertex  FHR: 135 bpm, Mod Var, -Decels, -Accels UCs:  None graphed  Prenatal labs: ABO, Rh:  A Positive Antibody: NEG (01/26 2157) Rubella:  Immune RPR:   NR HBsAg:   Negative HIV:   NR GBS:   Positive  Sickle cell/Hgb electrophoresis:  Normal Pap:  Normal 09/2014 GC:  Negative Chlamydia:  Negative Other:  None    Assessment IUP at 37.1wks Cat I FT NR NST BPP 6/8 S/P Fall  Plan: Admit to Antepartum for observation per consult with Dr. Katharine Look Routine Antepartum Orders per [facility] Guideline Continuous monitoring Plan repeat BPP in am-scheduled for 0900   Joellyn Quails, MSN 03/03/2015, 9:33 PM

## 2015-03-03 NOTE — MAU Note (Signed)
Pt slipped on ICe at [employer] ) Stated her legs split and she fell a little bit to her left side. Denies hitting abd. C/o pelvic pain now and some cramping. Denies vaginal bleeding or leaking and reports good fetal movement.

## 2015-03-03 NOTE — MAU Provider Note (Signed)
History    Amanda Wise is a 29y.o. G1P0 at 37.1wks who presents, unannounced, for fall.  Patient states she fell at work around 1500 and hit her left side, but not her abdomen.  Patient states she "fell into the splits."  Patient reports intermittent pelvic pain, but denies LOF, VB, and contractions.  Patient endorses fetal movement.  Patient states that she has not eaten since 1100 and has not been drinking fluid throughout the day.  Patient denies GI concerns, urinary issues, and other pain.    There are no active problems to display for this patient.   Chief Complaint  Patient presents with  . Fall   HPI  OB History    Gravida Para Term Preterm AB TAB SAB Ectopic Multiple Living   1               Past Medical History  Diagnosis Date  . Hypertension   . Anemia     Past Surgical History  Procedure Laterality Date  . No past surgeries      Family History  Problem Relation Age of Onset  . Hypertension Other   . Hypertension Maternal Grandmother   . Hypertension Paternal Grandmother     Social History  Substance Use Topics  . Smoking status: Never Smoker   . Smokeless tobacco: Never Used  . Alcohol Use: No    Allergies: No Known Allergies  Prescriptions prior to admission  Medication Sig Dispense Refill Last Dose  . ferrous sulfate 325 (65 FE) MG tablet Take 325 mg by mouth daily with breakfast.   03/02/2015 at Unknown time  . Prenatal Vit-Fe Fumarate-FA (PRENATAL MULTIVITAMIN) TABS tablet Take 1 tablet by mouth daily at 12 noon.   03/03/2015 at Unknown time    ROS  See HPI Above Physical Exam   Blood pressure 113/80, pulse 72, temperature 98 F (36.7 C), temperature source Oral, resp. rate 18, height  (1.702 m), weight 157.852 kg (348 lb).  Results for orders placed or performed during the hospital encounter of 03/03/15 (from the past 24 hour(s))  CBC     Status: Abnormal   Collection Time: 03/03/15  7:11 PM  Result Value Ref Range   WBC 11.1 (H) 4.0  - 10.5 K/uL   RBC 3.80 (L) 3.87 - 5.11 MIL/uL   Hemoglobin 11.5 (L) 12.0 - 15.0 g/dL   HCT 91.4 (L) 78.2 - 95.6 %   MCV 88.7 78.0 - 100.0 fL   MCH 30.3 26.0 - 34.0 pg   MCHC 34.1 30.0 - 36.0 g/dL   RDW 21.3 08.6 - 57.8 %   Platelets 249 150 - 400 K/uL    Physical Exam  Vitals reviewed. Constitutional: She is oriented to person, place, and time. No distress.  Morbidly Obese  HENT:  Head: Normocephalic and atraumatic.  Eyes: EOM are normal.  Neck: Normal range of motion.  Cardiovascular: Normal rate, regular rhythm and normal heart sounds.   Respiratory: Effort normal and breath sounds normal.  GI: Soft. Bowel sounds are normal. There is no tenderness.  Musculoskeletal: Normal range of motion.  Neurological: She is alert and oriented to person, place, and time.  Skin: Skin is warm and dry.     FHR:145 bpm, Mod Var, -Decels, -Accels UC: Q2-4min, palpates mild  ED Course  Assessment: IUP at 37.1wks Cat I FT S/P Fall NR NST  Plan: -Oral hydration -Give snack for lack of intake -Labs: CBC, UA -BPP ordered -Patient declines pain medication at this  time stating pain is 4/10  Follow Up (2128) -BPP returns at 6/8 -Dr. Alvino Chapel consulted and advised -Admit for observation and repeat BPP in am -In room to discuss POC with patient, no Q/C  Cherre Robins CNM, MSN 03/03/2015 7:46 PM

## 2015-03-04 ENCOUNTER — Observation Stay (HOSPITAL_COMMUNITY): Payer: Medicaid Other

## 2015-03-04 ENCOUNTER — Encounter (HOSPITAL_COMMUNITY): Payer: Self-pay

## 2015-03-04 DIAGNOSIS — O9902 Anemia complicating childbirth: Secondary | ICD-10-CM | POA: Diagnosis present

## 2015-03-04 DIAGNOSIS — O368390 Maternal care for abnormalities of the fetal heart rate or rhythm, unspecified trimester, not applicable or unspecified: Secondary | ICD-10-CM | POA: Diagnosis present

## 2015-03-04 DIAGNOSIS — O289 Unspecified abnormal findings on antenatal screening of mother: Secondary | ICD-10-CM | POA: Diagnosis present

## 2015-03-04 DIAGNOSIS — Z8249 Family history of ischemic heart disease and other diseases of the circulatory system: Secondary | ICD-10-CM | POA: Diagnosis not present

## 2015-03-04 DIAGNOSIS — O99214 Obesity complicating childbirth: Secondary | ICD-10-CM | POA: Diagnosis present

## 2015-03-04 DIAGNOSIS — Z6841 Body Mass Index (BMI) 40.0 and over, adult: Secondary | ICD-10-CM | POA: Diagnosis not present

## 2015-03-04 DIAGNOSIS — R102 Pelvic and perineal pain: Secondary | ICD-10-CM | POA: Diagnosis present

## 2015-03-04 DIAGNOSIS — Z3A37 37 weeks gestation of pregnancy: Secondary | ICD-10-CM | POA: Diagnosis not present

## 2015-03-04 DIAGNOSIS — R600 Localized edema: Secondary | ICD-10-CM | POA: Diagnosis present

## 2015-03-04 DIAGNOSIS — O99824 Streptococcus B carrier state complicating childbirth: Secondary | ICD-10-CM | POA: Diagnosis present

## 2015-03-04 LAB — CBC
HEMATOCRIT: 32.7 % — AB (ref 36.0–46.0)
Hemoglobin: 10.9 g/dL — ABNORMAL LOW (ref 12.0–15.0)
MCH: 30.1 pg (ref 26.0–34.0)
MCHC: 33.3 g/dL (ref 30.0–36.0)
MCV: 90.3 fL (ref 78.0–100.0)
PLATELETS: 228 10*3/uL (ref 150–400)
RBC: 3.62 MIL/uL — ABNORMAL LOW (ref 3.87–5.11)
RDW: 15 % (ref 11.5–15.5)
WBC: 8.3 10*3/uL (ref 4.0–10.5)

## 2015-03-04 LAB — URINALYSIS, ROUTINE W REFLEX MICROSCOPIC
BILIRUBIN URINE: NEGATIVE
Glucose, UA: NEGATIVE mg/dL
Hgb urine dipstick: NEGATIVE
KETONES UR: NEGATIVE mg/dL
NITRITE: NEGATIVE
PH: 5.5 (ref 5.0–8.0)
PROTEIN: NEGATIVE mg/dL
Specific Gravity, Urine: 1.025 (ref 1.005–1.030)

## 2015-03-04 LAB — URINE MICROSCOPIC-ADD ON

## 2015-03-04 LAB — ABO/RH: ABO/RH(D): A POS

## 2015-03-04 MED ORDER — OXYCODONE-ACETAMINOPHEN 5-325 MG PO TABS: 1.0000 | ORAL_TABLET | ORAL | Status: DC | PRN

## 2015-03-04 MED ORDER — MISOPROSTOL 25 MCG QUARTER TABLET
25.0000 ug | ORAL_TABLET | ORAL | Status: DC | PRN
  Administered 2015-03-04 – 2015-03-05 (×4): 25 ug via VAGINAL
  Filled 2015-03-04 (×5): qty 0.25

## 2015-03-04 MED ORDER — LACTATED RINGERS IV BOLUS (SEPSIS)
1000.0000 mL | Freq: Once | INTRAVENOUS | Status: AC
  Administered 2015-03-04: 1000 mL via INTRAVENOUS

## 2015-03-04 MED ORDER — OXYTOCIN 10 UNIT/ML IJ SOLN
2.5000 [IU]/h | INTRAMUSCULAR | Status: DC
  Filled 2015-03-04 (×2): qty 4

## 2015-03-04 MED ORDER — OXYTOCIN BOLUS FROM INFUSION: 500.0000 mL | INTRAVENOUS | Status: DC

## 2015-03-04 MED ORDER — TERBUTALINE SULFATE 1 MG/ML IJ SOLN
0.2500 mg | Freq: Once | INTRAMUSCULAR | Status: DC | PRN
  Filled 2015-03-04: qty 1

## 2015-03-04 MED ORDER — LACTATED RINGERS IV SOLN
INTRAVENOUS | Status: DC
  Administered 2015-03-04 – 2015-03-06 (×6): via INTRAVENOUS

## 2015-03-04 MED ORDER — OXYCODONE-ACETAMINOPHEN 5-325 MG PO TABS: 2.0000 | ORAL_TABLET | ORAL | Status: DC | PRN

## 2015-03-04 MED ORDER — PENICILLIN G POTASSIUM 5000000 UNITS IJ SOLR
2.5000 10*6.[IU] | INTRAVENOUS | Status: DC
  Administered 2015-03-04 – 2015-03-05 (×4): 2.5 10*6.[IU] via INTRAVENOUS
  Filled 2015-03-04 (×8): qty 2.5

## 2015-03-04 MED ORDER — ONDANSETRON HCL 4 MG/2ML IJ SOLN
4.0000 mg | Freq: Four times a day (QID) | INTRAMUSCULAR | Status: DC | PRN
  Administered 2015-03-05 – 2015-03-06 (×2): 4 mg via INTRAVENOUS
  Filled 2015-03-04 (×2): qty 2

## 2015-03-04 MED ORDER — LIDOCAINE HCL (PF) 1 % IJ SOLN
30.0000 mL | INTRAMUSCULAR | Status: AC | PRN
  Administered 2015-03-06: 30 mL via SUBCUTANEOUS
  Filled 2015-03-04: qty 30

## 2015-03-04 MED ORDER — PENICILLIN G POTASSIUM 5000000 UNITS IJ SOLR
5.0000 10*6.[IU] | Freq: Once | INTRAVENOUS | Status: AC
  Administered 2015-03-04: 5 10*6.[IU] via INTRAVENOUS
  Filled 2015-03-04: qty 5

## 2015-03-04 MED ORDER — CITRIC ACID-SODIUM CITRATE 334-500 MG/5ML PO SOLN: 30.0000 mL | ORAL | Status: DC | PRN

## 2015-03-04 MED ORDER — FENTANYL CITRATE (PF) 100 MCG/2ML IJ SOLN
100.0000 ug | INTRAMUSCULAR | Status: DC | PRN
  Administered 2015-03-06: 100 ug via INTRAVENOUS
  Filled 2015-03-04: qty 2

## 2015-03-04 MED ORDER — FLEET ENEMA 7-19 GM/118ML RE ENEM: 1.0000 | ENEMA | RECTAL | Status: DC | PRN

## 2015-03-04 MED ORDER — LACTATED RINGERS IV SOLN
500.0000 mL | INTRAVENOUS | Status: DC | PRN
  Administered 2015-03-05: 1000 mL via INTRAVENOUS

## 2015-03-04 NOTE — Progress Notes (Addendum)
Returned from US--BPP still 4/8, AFI 8, no movement or tone, + FBM and adequate fluid Patient aware of FM.  FHR Category 1--moderate variability, no decels, occasional accels No UCs.  Filed Vitals:   03/04/15 1043 03/04/15 1453 03/04/15 1454 03/04/15 1758  BP: 122/70  113/62 128/68  Pulse: 79  79 84  Temp: 98.5 F (36.9 C) 97.9 F (36.6 C)  98.6 F (37 C)  TempSrc: Oral Oral  Oral  Resp: Height:      Weight:      SpO2:       Reviewed status with Dr. Sallye Ober. Recommendation for induction due BPP 4/8 x 2. Reviewed plan of care with patient, mother, and grandmother, including rationale and processes of induction, including Cytotech, foley bulb, pitocin, AROM. Risks and benefits of induction were reviewed, including failure of method, prolonged labor, need for further intervention, risk of cesarean.  Patient and family seem to understand these risks and wish to proceed.  Cervix still very posterior, closed, thick, vtx, -2/-3. Cytotech 25 mcg placed in posterior fornix.  Will start PCN for GBS prophylaxis as labor progresses, or if FHR changes occur with Cytotech. Repeat Cytotech q 4 hours as appropriate for cervical ripenings.  Nigel Bridgeman, CNM 03/04/15 7:10p  I agree with above findings, assessment and plan.  I discussed case with MFM Dr.Decker, we discussed that given persistent BPP of 6/10 (-2 tone, -2 GFM most recently) will proceed with labor induction.  I note NST has been reactive, with moderate variability, category 1 for most of the day.  Dr. Sallye Ober.  03/04/15 .

## 2015-03-04 NOTE — Progress Notes (Addendum)
Assuming care of Amanda Wise, 30 yo G1P0 @ 37.2 wks admitted last evening for obs due to fall and BPP 6/8. BPP today was 4/10 on two occasions --- MFM recommended IOL.   Subjective: Endorses fetal activity. Denies ctxs, VB or LOF.  Objective: BP 132/63 mmHg  Pulse 81  Temp(Src) 98.6 F (37 C) (Oral)  Resp 18  Ht  (1.702 m)  Wt 157.852 kg (348 lb)  BMI 54.49 kg/m2  SpO2 99% Today's Vitals   03/04/15 1454 03/04/15 1758 03/04/15 1914 03/04/15 1946  BP: 113/62 128/68 132/63   Pulse: 79 84 81   Temp:  98.6 F (37 C)    TempSrc:  Oral    Resp:  20 18   Height:      Weight:      SpO2:      PainSc:  0-No pain 0-No pain 0-No pain   Gen: NAD Lungs: CTAB CV: RRR w/o M/R/G Abdomen: obese, NT Cephalic by today's scan and Leopolds Unable to accurately determine EFW due to maternal habitus Ext: 1+ LE edema FHT: BL 148 w/ moderate variability, 10x10 accels, occ mild variables, no lates UC: Occasional SVE: Deferred    Assessment:  IOL at 37.2 wks due to BPP 4/8 (-2 movement, -2 tone) Morbid obesity (BMI 54) GBS positive Unfavorable cvx Reassuring FHRT  Plan: Continue induction per telephonic conversation with Dr. Estanislado Pandy Monitor closely Clear liquid diet Position changes 1L bolus  Sherre Scarlet CNM 03/04/2015, 8:27 PM

## 2015-03-04 NOTE — Progress Notes (Addendum)
Hospital day #1  pregnancy at [redacted]w[redacted]d--S/P fall yesterday around 3pm.  S:  Feeling fine when at rest, some pelvic soreness when getting up, but declines need for pain med.      Perception of contractions: none      Vaginal bleeding: None       Vaginal discharge:  None  O: BP 119/73 mmHg  Pulse 71  Temp(Src) 98 F (36.7 C) (Oral)  Resp 18  Ht  (1.702 m)  Wt 157.852 kg (348 lb)  BMI 54.49 kg/m2  SpO2 99%      Fetal tracings:  Category 1, with accels noted.  Drop-out of fetal tracing at times during the night due to body habitus.      Contractions:  One this hour, mild      Uterus gravid and non-tender      Extremities: no significant edema and no signs of DVT, no evidence trauma          Labs:   Results for orders placed or performed during the hospital encounter of 03/03/15 (from the past 24 hour(s))  Urinalysis, Routine w reflex microscopic (not at [facility])     Status: Abnormal   Collection Time: 03/03/15  6:30 PM  Result Value Ref Range   Color, Urine YELLOW YELLOW   APPearance CLOUDY (A) CLEAR   Specific Gravity, Urine >1.030 (H) 1.005 - 1.030   pH 5.5 5.0 - 8.0   Glucose, UA NEGATIVE NEGATIVE mg/dL   Hgb urine dipstick NEGATIVE NEGATIVE   Bilirubin Urine NEGATIVE NEGATIVE   Ketones, ur 15 (A) NEGATIVE mg/dL   Protein, ur 30 (A) NEGATIVE mg/dL   Nitrite NEGATIVE NEGATIVE   Leukocytes, UA TRACE (A) NEGATIVE  Urine microscopic-add on     Status: Abnormal   Collection Time: 03/03/15  6:30 PM  Result Value Ref Range   Squamous Epithelial / LPF 6-30 (A) NONE SEEN   WBC, UA 0-5 0 - 5 WBC/hpf   RBC / HPF 0-5 0 - 5 RBC/hpf   Bacteria, UA MANY (A) NONE SEEN   Urine-Other MUCOUS PRESENT   CBC     Status: Abnormal   Collection Time: 03/03/15  7:11 PM  Result Value Ref Range   WBC 11.1 (H) 4.0 - 10.5 K/uL   RBC 3.80 (L) 3.87 - 5.11 MIL/uL   Hemoglobin 11.5 (L) 12.0 - 15.0 g/dL   HCT 29.5 (L) 62.1 - 30.8 %   MCV 88.7 78.0 - 100.0 fL   MCH 30.3 26.0 - 34.0 pg   MCHC 34.1  30.0 - 36.0 g/dL   RDW 65.7 84.6 - 96.2 %   Platelets 249 150 - 400 K/uL  Type and screen [facility]     Status: None   Collection Time: 03/03/15  9:57 PM  Result Value Ref Range   ABO/RH(D) A POS    Antibody Screen NEG    Sample Expiration 03/06/2015   ABO/Rh     Status: None   Collection Time: 03/03/15  9:57 PM  Result Value Ref Range   ABO/RH(D) A POS          Meds:  . docusate sodium  100 mg Oral Daily  . prenatal multivitamin  1 tablet Oral Q1200    A: [redacted]w[redacted]d s/p fall around 3pm yesterday     BPP 6/8 last night.     Stable  P: Continue current plan of care      Upcoming tests/treatments:  Repeat BPP this am.  Repeat  UA today due to to ketones and protein last night.      MDs will follow  Nigel Bridgeman CNM, [place] 03/04/2015 9:01 AM  Addendum: Returned from [place]:  BPP 4/8, -2 for movement and tone.  Tech reported "baby was moving just before we started". EFM reapplied on patient return--Category 1 FHR, no UCs. Cervix very posterior, closed, thick, vtx, -2/-3.  Filed Vitals:   03/04/15 0705 03/04/15 0710 03/04/15 0735 03/04/15 1043  BP:   119/73 122/70  Pulse: 79 75 71 79  Temp:   98 F (36.7 C) 98.5 F (36.9 C)  TempSrc:   Oral Oral  Resp:   18 20  Height:      Weight:      SpO2: 99% 99%     Will consult with Dr. Sallye Ober regarding plan of care.  Nigel Bridgeman, CNM 03/04/15 11a  Addendum: Per consult with Dr. Virgina Norfolk BPP with AFI at 5pm today, then re-evaluate status.  Nigel Bridgeman, CNM 1/127/17 11:45a

## 2015-03-05 LAB — HIV ANTIBODY (ROUTINE TESTING W REFLEX): HIV SCREEN 4TH GENERATION: NONREACTIVE

## 2015-03-05 LAB — RPR: RPR Ser Ql: NONREACTIVE

## 2015-03-05 MED ORDER — OXYTOCIN 10 UNIT/ML IJ SOLN
1.0000 m[IU]/min | INTRAVENOUS | Status: DC
  Administered 2015-03-05: 1 m[IU]/min via INTRAVENOUS

## 2015-03-05 MED ORDER — PENICILLIN G POTASSIUM 5000000 UNITS IJ SOLR
5.0000 10*6.[IU] | Freq: Once | INTRAVENOUS | Status: AC
  Administered 2015-03-05: 5 10*6.[IU] via INTRAVENOUS
  Filled 2015-03-05: qty 5

## 2015-03-05 MED ORDER — PENICILLIN G POTASSIUM 5000000 UNITS IJ SOLR
2.5000 10*6.[IU] | INTRAVENOUS | Status: DC
  Administered 2015-03-06 (×2): 2.5 10*6.[IU] via INTRAVENOUS
  Filled 2015-03-05 (×7): qty 2.5

## 2015-03-05 MED ORDER — TERBUTALINE SULFATE 1 MG/ML IJ SOLN
0.2500 mg | Freq: Once | INTRAMUSCULAR | Status: DC | PRN
  Filled 2015-03-05: qty 1

## 2015-03-05 NOTE — Progress Notes (Addendum)
  Subjective: Has slept some. Feeling contractions in her back. Declines pain medication at this time. Perceives FM. No VB or LOF.  Objective: BP 109/54 mmHg  Pulse 72  Temp(Src) 97.7 F (36.5 C) (Oral)  Resp 18  Ht 5\' 7"  (1.702 m)  Wt 157.852 kg (348 lb)  BMI 54.49 kg/m2  SpO2 99% Today's Vitals   03/05/15 0100 03/05/15 0211 03/05/15 0300 03/05/15 0500  BP:  109/54    Pulse:  72    Temp:  97.7 F (36.5 C)    TempSrc:  Oral    Resp:  18    Height:      Weight:      SpO2:      PainSc: 0-No pain  3  3     FHT: BL 140 w/ min-mod variability, 10x10 accels, occ variable--sharp & quick in nature  UC: Per RN, contracting too much for a 3rd Cytotec (every 2-6 minutes) SVE:   Dilation: Closed Effacement (%): Thick Station: -2, -3 Exam by:: Thornell Mule, CNM External os slightly open, cvx med-soft  Cytotec #1 placed at 1911 Cytotec #2 placed at 2325  Has received 3 doses of PCN for +GBS status  Assessment:  IUP at 37.3 wks -- IOL due to BPP 4/10 GBS positive Reassuring FHRT  Plan: Position change x 30 minutes to observe regularity of ctxs. Will place 3rd Cytotec if contracting infrequently 1 liter LR bolus Consult prn  Sherre Scarlet CNM 03/05/2015, 5:27 AM  ADDENDUM: Contractions noted to be every 1-5 minutes. Will defer Cytotec #3 at this time. LR bolus in progress. Repositioned back to side.  Sherre Scarlet, CNM 03/05/15, 6 AM

## 2015-03-05 NOTE — Progress Notes (Addendum)
  Subjective: Aware of some mild cramping, but not uncomfortable.    Objective: BP 133/77 mmHg  Pulse 74  Temp(Src) 98 F (36.7 C) (Oral)  Resp 18  Ht  (1.702 m)  Wt 157.852 kg (348 lb)  BMI 54.49 kg/m2  SpO2 99%      FHT: Category 1 UC:   irregular, every 5-7 minutes, mild SVE:  2 cm, 50%, vtx, -1, soft, slightly posterior Dr. Sallye Ober in--foley bulb attempted, placed by Nigel Bridgeman, CNM Small amount bloody show noted.   Assessment:  Induction for BPP 4/8--FHR stable. Morbid obesity GBS positive--received several doses of PCN while on Cytotech  Plan: Maintain foley bulb up to 12 hours. Pitocin when foley extruded/removed. Patient may shower May have regular diet tonight. SCDs when in bed. Restart PCN with onset of active labor, initiation of pitocin, or AROM/SROM.  Nigel Bridgeman CNM 03/05/2015, 6:11 PM  I saw and examined patient at bedside and agree with above findings, assessment and plan.  Dr. Sallye Ober.

## 2015-03-05 NOTE — Progress Notes (Signed)
Out of shower, back in bed and on phone. Per RN, on way back from [place], intracervical bulb out at 2302. Family at bedside.  Subjective: No c/o. Changed mind about pain medication. Coping with ctxs. No leaking or bleeding.  Objective: BP 134/75 mmHg  Pulse 75  Temp(Src) 98.3 F (36.8 C) (Oral)  Resp 18  Ht  (1.702 m)  Wt 157.852 kg (348 lb)  BMI 54.49 kg/m2  SpO2 99% Today's Vitals   03/05/15 1752 03/05/15 1808 03/05/15 1940 03/05/15 2258  BP:  133/77 134/75   Pulse:  74 75   Temp:   98.3 F (36.8 C)   TempSrc:   Oral   Resp:  Height:      Weight:      SpO2:      PainSc: 0-No pain      FHT: 150 w/ moderate variability, +accels, no decels UC:   irregular, every 2-6 minutes SVE:   Dilation: 3.5 Effacement (%): 50 Station:  (High) Exam by:: Thornell Mule, CNM Bloody show noted to examining glove - vtx too high for amniotomy  Assessment:  IOL GBS positive Latent phase of labor Cat 1 FHRT  Plan: Begin next step in induction process --- Pitocin. Pt in agreement. AROM when clinically feasible. Clear diet. Restart GBS prophylaxis. Consult prn. Dr. Sallye Ober updated.  Sherre Scarlet CNM 03/05/2015, 11:25 PM

## 2015-03-05 NOTE — Progress Notes (Signed)
Received report and assumed care.  Subjective: Sitting up, on phone. Awaiting dinner tray. Desires to shower after meal. Contractions becoming more intense; desires pain medication. No leaking or bleeding. +FM.  Objective: BP 133/77 mmHg  Pulse 74  Temp(Src) 98 F (36.7 C) (Oral)  Resp 18  Ht 5\' 7"  (1.702 m)  Wt 157.852 kg (348 lb)  BMI 54.49 kg/m2  SpO2 99% Today's Vitals   03/05/15 1515 03/05/15 1700 03/05/15 1752 03/05/15 1808  BP: 117/72   133/77  Pulse: 86   74  Temp:      TempSrc:      Resp: 18   18  Height:      Weight:      SpO2:      PainSc:  0-No pain 0-No pain     FHT: BL 140 w/ mod variability, 10x10 accels, no decels UC:  irregular SVE: 2/50/-1, vtx, soft, slightly posterior per Nigel Bridgeman, CNM at 1800 Intracervical bulb placed at 6 pm by Nigel Bridgeman, CNM - catheter taut Received total of 4 doses of Cytotec prior to bulb placement SCDs in place  Assessment:  IOL due to BPP 4/10 GBS positive Morbid obesity Reassuring FHRT  Plan: Continue induction Pain meds as desired Consult prn  Sherre Scarlet CNM 03/05/2015, 7:17 PM

## 2015-03-05 NOTE — Progress Notes (Signed)
Report recv'd, care assumed 

## 2015-03-05 NOTE — Progress Notes (Signed)
  Subjective: Rested fairly well at intervals during night.  Mother and grandmother at bedside.  Aware of UCs, but not painful.  Objective: BP 123/82 mmHg  Pulse 79  Temp(Src) 98 F (36.7 C) (Oral)  Resp 18  Ht  (1.702 m)  Wt 157.852 kg (348 lb)  BMI 54.49 kg/m2  SpO2 99%    Reviewed patient status with Dr. Estanislado Pandy at 9am.  FHT: Category 1--no decels with UCs. UC:   irregular, every 6-8 minutes, mild SVE:   Dilation: Closed Effacement (%): Thick Station: -2 Exam by:: [employer] #3 placed in posterior fornix.  Assessment:  IUP at 37 3/7 weeks BPP 4/8 Unfavorable cervix Morbid obesity FHR status reassuring  Plan: Reviewed need for further cervical ripening, with plan for continuing Cytotech at present.  Amanda Wise CNM 03/05/2015, 10:10 AM

## 2015-03-05 NOTE — Progress Notes (Addendum)
  Subjective: Comfortable, aware of UCs, but no pain.  Objective: BP 117/58 mmHg  Pulse 78  Temp(Src) 98 F (36.7 C) (Oral)  Resp 18  Ht 5\' 7"  (1.702 m)  Wt 157.852 kg (348 lb)  BMI 54.49 kg/m2  SpO2 99%      FHT: Category 1 UC:   regular, every 8 minutes SVE:   Dilation: Fingertip Effacement (%): 50 Station: -2 Exam by:: Shantana Christon CNM  Cervix softer, still very posterior and to left Cytotech #4 placed in posterior fornix.  Assessment:  Induction for BPP 4/8 x 2 Unfavorable cervix GBS positive  Plan: Continue cervical ripening with Cytotech at present. Re-evaluate at 5:30p  Boden Stucky CNM 03/05/2015, 1:32 PM

## 2015-03-05 NOTE — Progress Notes (Signed)
  Subjective: Sleeping soundly--on right side.  Family at bedside, also asleep  Objective: BP 123/82 mmHg  Pulse 79  Temp(Src) 98 F (36.7 C) (Oral)  Resp 18  Ht  (1.702 m)  Wt 157.852 kg (348 lb)  BMI 54.49 kg/m2  SpO2 99%      Filed Vitals:   03/04/15 1914 03/04/15 2144 03/05/15 0211 03/05/15 0530  BP: 132/63 110/55 109/54 123/82  Pulse: 81 96 72 79  Temp:  97.8 F (36.6 C) 97.7 F (36.5 C) 98 F (36.7 C)  TempSrc:  Oral Oral Oral  Resp: Height:      Weight:      SpO2:        FHT: Category 1 overall--segments of classic reactivity, interspersed with cycles of reassuring, but not reactive, tracing.  Occasional very quick variables. UC:   irregular, every 2-5 minutes SVE:   Dilation: Closed Effacement (%): Thick Station: -2, -3 Exam by:: Thornell Mule, CNM at [phone] Received 2 doses Cytotech during night (1911 and 2325)  Assessment:  IUP at 37 3/7 weeks Persistent findings of BPP 4/8 MFM recommended induction  Plan: Re-evaluate cervix, anticipate repeat Cytotech  Amanda Wise CNM 03/05/2015, 8:20 AM

## 2015-03-05 NOTE — Progress Notes (Signed)
Pt showered, walked and back to bed without difficulty. Monitors replaced.

## 2015-03-06 ENCOUNTER — Encounter (HOSPITAL_COMMUNITY): Payer: Self-pay | Admitting: Anesthesiology

## 2015-03-06 ENCOUNTER — Inpatient Hospital Stay (HOSPITAL_COMMUNITY): Payer: Medicaid Other | Admitting: Anesthesiology

## 2015-03-06 ENCOUNTER — Encounter (HOSPITAL_COMMUNITY): Payer: Self-pay | Admitting: *Deleted

## 2015-03-06 DIAGNOSIS — B951 Streptococcus, group B, as the cause of diseases classified elsewhere: Secondary | ICD-10-CM | POA: Diagnosis present

## 2015-03-06 MED ORDER — SIMETHICONE 80 MG PO CHEW: 80.0000 mg | CHEWABLE_TABLET | ORAL | Status: DC | PRN

## 2015-03-06 MED ORDER — ZOLPIDEM TARTRATE 5 MG PO TABS: 5.0000 mg | ORAL_TABLET | Freq: Every evening | ORAL | Status: DC | PRN

## 2015-03-06 MED ORDER — ONDANSETRON HCL 4 MG PO TABS: 4.0000 mg | ORAL_TABLET | ORAL | Status: DC | PRN

## 2015-03-06 MED ORDER — DIPHENHYDRAMINE HCL 25 MG PO CAPS: 25.0000 mg | ORAL_CAPSULE | Freq: Four times a day (QID) | ORAL | Status: DC | PRN

## 2015-03-06 MED ORDER — SENNOSIDES-DOCUSATE SODIUM 8.6-50 MG PO TABS
2.0000 | ORAL_TABLET | ORAL | Status: DC
  Administered 2015-03-07 (×2): 2 via ORAL
  Filled 2015-03-06 (×2): qty 2

## 2015-03-06 MED ORDER — WITCH HAZEL-GLYCERIN EX PADS: 1.0000 "application " | MEDICATED_PAD | CUTANEOUS | Status: DC | PRN

## 2015-03-06 MED ORDER — LANOLIN HYDROUS EX OINT: TOPICAL_OINTMENT | CUTANEOUS | Status: DC | PRN

## 2015-03-06 MED ORDER — DIPHENHYDRAMINE HCL 50 MG/ML IJ SOLN: 12.5000 mg | INTRAMUSCULAR | Status: DC | PRN

## 2015-03-06 MED ORDER — DIBUCAINE 1 % RE OINT: 1.0000 "application " | TOPICAL_OINTMENT | RECTAL | Status: DC | PRN

## 2015-03-06 MED ORDER — ACETAMINOPHEN 325 MG PO TABS: 650.0000 mg | ORAL_TABLET | ORAL | Status: DC | PRN

## 2015-03-06 MED ORDER — LIDOCAINE HCL (PF) 1 % IJ SOLN
INTRAMUSCULAR | Status: DC | PRN
  Administered 2015-03-06: 4 mL via EPIDURAL
  Administered 2015-03-06: 3 mL via EPIDURAL

## 2015-03-06 MED ORDER — IBUPROFEN 600 MG PO TABS
600.0000 mg | ORAL_TABLET | Freq: Four times a day (QID) | ORAL | Status: DC
  Administered 2015-03-06 – 2015-03-08 (×8): 600 mg via ORAL
  Filled 2015-03-06 (×8): qty 1

## 2015-03-06 MED ORDER — PRENATAL MULTIVITAMIN CH
1.0000 | ORAL_TABLET | Freq: Every day | ORAL | Status: DC
  Administered 2015-03-07 – 2015-03-08 (×2): 1 via ORAL
  Filled 2015-03-06 (×2): qty 1

## 2015-03-06 MED ORDER — TETANUS-DIPHTH-ACELL PERTUSSIS 5-2.5-18.5 LF-MCG/0.5 IM SUSP: 0.5000 mL | Freq: Once | INTRAMUSCULAR | Status: DC

## 2015-03-06 MED ORDER — ONDANSETRON HCL 4 MG/2ML IJ SOLN: 4.0000 mg | INTRAMUSCULAR | Status: DC | PRN

## 2015-03-06 MED ORDER — PHENYLEPHRINE 40 MCG/ML (10ML) SYRINGE FOR IV PUSH (FOR BLOOD PRESSURE SUPPORT)
80.0000 ug | PREFILLED_SYRINGE | INTRAVENOUS | Status: DC | PRN
  Filled 2015-03-06: qty 20
  Filled 2015-03-06: qty 2

## 2015-03-06 MED ORDER — EPHEDRINE 5 MG/ML INJ
10.0000 mg | INTRAVENOUS | Status: DC | PRN
  Filled 2015-03-06: qty 2

## 2015-03-06 MED ORDER — FENTANYL 2.5 MCG/ML BUPIVACAINE 1/10 % EPIDURAL INFUSION (WH - ANES)
14.0000 mL/h | INTRAMUSCULAR | Status: DC | PRN
  Filled 2015-03-06: qty 125

## 2015-03-06 MED ORDER — BENZOCAINE-MENTHOL 20-0.5 % EX AERO
1.0000 "application " | INHALATION_SPRAY | CUTANEOUS | Status: DC | PRN
  Administered 2015-03-06: 1 via TOPICAL
  Filled 2015-03-06: qty 56

## 2015-03-06 NOTE — Progress Notes (Signed)
Sleeping yet easily aroused. Family at bedside and also sleeping.  Subjective: Feeling ctxs but declines pain meds at this time. +FM.  Objective: BP 135/80 mmHg  Pulse 78  Temp(Src) 98.5 F (36.9 C) (Oral)  Resp 18  Ht 5\' 7"  (1.702 m)  Wt 157.852 kg (348 lb)  BMI 54.49 kg/m2  SpO2 99% Today's Vitals   03/06/15 0331 03/06/15 0401 03/06/15 0431 03/06/15 0501  BP: 112/79 126/71 130/72 135/80  Pulse: 75 68 77 78  Temp:   98.5 F (36.9 C)   TempSrc:   Oral   Resp: 18 18 18 18   Height:      Weight:      SpO2:      PainSc:       FHT: BL 130 w/ moderate variability, +accels, no decels UC:   irregular, every 2-4 minutes SVE:   Dilation: 3.5 Effacement (%): 50 Station: -3 Exam by:: Thornell Mule, CNM Pitocin at 13 mU/min  Too high for amniotomy  Assessment:  IOL due to BPP 4/10 GBS positive - adequately treated Cat 1 FHRT  Plan: Continue induction Consult prn  Sherre Scarlet CNM 03/06/2015, 5:17 AM

## 2015-03-06 NOTE — Anesthesia Preprocedure Evaluation (Signed)
Anesthesia Evaluation  Patient identified by MRN, date of birth, ID band Patient awake    Reviewed: Allergy & Precautions, NPO status , Patient's Chart, lab work & pertinent test results  Airway Mallampati: II  TM Distance: >3 FB Neck ROM: Full    Dental  (+) Teeth Intact, Dental Advisory Given   Pulmonary    breath sounds clear to auscultation       Cardiovascular hypertension,  Rhythm:Regular Rate:Normal     Neuro/Psych    GI/Hepatic   Endo/Other    Renal/GU      Musculoskeletal   Abdominal   Peds  Hematology   Anesthesia Other Findings   Reproductive/Obstetrics (+) Pregnancy                             Anesthesia Physical Anesthesia Plan  ASA: III  Anesthesia Plan: Epidural   Post-op Pain Management:    Induction:   Airway Management Planned: Natural Airway  Additional Equipment:   Intra-op Plan:   Post-operative Plan:   Informed Consent: I have reviewed the patients History and Physical, chart, labs and discussed the procedure including the risks, benefits and alternatives for the proposed anesthesia with the patient or authorized representative who has indicated his/her understanding and acceptance.   Dental advisory given  Plan Discussed with: Anesthesiologist  Anesthesia Plan Comments:         Anesthesia Quick Evaluation

## 2015-03-06 NOTE — Anesthesia Procedure Notes (Signed)
Epidural Patient location during procedure: OB Start time: 03/06/2015 1:59 PM  Staffing Anesthesiologist: Sari Cogan  Preanesthetic Checklist Completed: patient identified, site marked, surgical consent, pre-op evaluation, timeout performed, IV checked, risks and benefits discussed and monitors and equipment checked  Epidural Patient position: sitting Prep: DuraPrep Patient monitoring: heart rate, cardiac monitor, continuous pulse ox and blood pressure Approach: midline Location: L3-L4 Injection technique: LOR air  Needle:  Needle type: Tuohy  Needle gauge: 17 G Needle length: 9 cm Needle insertion depth: 8 cm Catheter type: closed end flexible Catheter size: 20 Guage Catheter at skin depth: 9 cm Test dose: negative

## 2015-03-06 NOTE — Progress Notes (Signed)
  Subjective: Patient sleeping soundly.  FOB at bedside.  Objective: BP 136/80 mmHg  Pulse 73  Temp(Src) 97.6 F (36.4 C) (Oral)  Resp 20  Ht 5\' 7"  (1.702 m)  Wt 157.852 kg (348 lb)  BMI 54.49 kg/m2  SpO2 99%      FHT: Category 1 UC:   regular, every 3-4 minutes--able to trace when patient on back, but difficult to trace when on side (where she is more comfortable). SVE:   Dilation: 3 Effacement (%): 50 Station: -3 Exam by:: Nigel Bridgeman, CNM  PP OOP--vtx presentation verified by BS [place].  Too high for AROM attempt. Pitocin on 15 mu/min.  Assessment:  Induction for BPP 4/8 S/p Cytotech x 4 doses, extrusion of foley bulb at 2302 GBS positive Morbid obesity  Plan: Continue current plan of care. Dr. Estanislado Pandy updated.  Nigel Bridgeman CNM 03/06/2015, 9:50a

## 2015-03-07 LAB — CBC
HCT: 26.9 % — ABNORMAL LOW (ref 36.0–46.0)
Hemoglobin: 8.9 g/dL — ABNORMAL LOW (ref 12.0–15.0)
MCH: 29.9 pg (ref 26.0–34.0)
MCHC: 33.1 g/dL (ref 30.0–36.0)
MCV: 90.3 fL (ref 78.0–100.0)
PLATELETS: 187 10*3/uL (ref 150–400)
RBC: 2.98 MIL/uL — ABNORMAL LOW (ref 3.87–5.11)
RDW: 15 % (ref 11.5–15.5)
WBC: 12.3 10*3/uL — AB (ref 4.0–10.5)

## 2015-03-07 MED ORDER — FERROUS SULFATE 325 (65 FE) MG PO TABS
325.0000 mg | ORAL_TABLET | Freq: Two times a day (BID) | ORAL | Status: DC
  Administered 2015-03-07 – 2015-03-08 (×2): 325 mg via ORAL
  Filled 2015-03-07 (×2): qty 1

## 2015-03-07 NOTE — Lactation Note (Addendum)
This note was copied from the chart of Amanda Lounell Schumacher. Lactation Consultation Note Pendulum breast w/nipples at the end of breast pointing towards moms body. Has to lift up breast to see nipples, unable to apply NS per self d/t at the bottom of nipple, and NS will not stay on well. On my assessment baby had hiccups and wasn't interested in BF. W/gloves finger, attempted to stimulate to suck. Only bit my finger. Has high palate. Hand expression taught w/no colostrum noted. Mom has everted nipples w/stimulation that looks like a donut d/t inverted center. Spoke w/mom if she was interested in pumping and bottle feeding. Mom stated yes. Mom was fitted w/#24 and #20 NS. #20 fit better for suction. When wrapping baby up to put in bassinet mom fell a sleep. Will teach set up of DEBP when awakens. Patient Name: Amanda Wise RUEAV'W Date: 03/07/2015 Reason for consult: Follow-up assessment;Difficult latch   Maternal Data Has patient been taught Hand Expression?: Yes  Feeding Feeding Type: Breast Fed Length of feed: 0 min  LATCH Score/Interventions Latch: Too sleepy or reluctant, no latch achieved, no sucking elicited. Intervention(s): Adjust position;Assist with latch;Breast massage;Breast compression  Audible Swallowing: None Intervention(s): Hand expression  Type of Nipple: Everted at rest and after stimulation  Comfort (Breast/Nipple): Soft / non-tender     Hold (Positioning): Full assist, staff holds infant at breast Intervention(s): Breastfeeding basics reviewed;Support Pillows;Position options;Skin to skin  LATCH Score: 4  Lactation Tools Discussed/Used Tools: Nipple Dorris Carnes;Pump Nipple shield size: 20;24   Consult Status Consult Status: Follow-up Date: 03/07/15 Follow-up type: In-patient    [name], Diamond Nickel 03/07/2015, 3:32 AM

## 2015-03-07 NOTE — Progress Notes (Addendum)
Subjective: Postpartum Day 1: Vaginal delivery, right periurethral and 2nd degree perineal laceration Patient up ad lib, reports no syncope or dizziness. Feeding:  Breast Contraceptive plan:  Undecided  Objective: Vital signs in last 24 hours: Temp:  [97.6 F (36.4 C)-98.9 F (37.2 C)] 98.4 F (36.9 C) (01/30 0910) Pulse Rate:  [66-121] 83 (01/30 0910) Resp:  [18-20] 18 (01/30 0910) BP: (97-156)/(47-122) 140/85 mmHg (01/30 0910) SpO2:  [99 %] 99 % (01/30 0910)   BP range since delivery--97-144/68-122. Single BP 144/122 at 2200, then 126/70 5 min later.  BPs this am--136-140/85-86   Physical Exam:  General: alert Lochia: appropriate Uterine Fundus: firm Perineum: healing well DVT Evaluation: No evidence of DVT seen on physical exam. Negative Homan's sign.   CBC Latest Ref Rng 03/07/2015 03/04/2015 03/03/2015  WBC 4.0 - 10.5 K/uL 12.3(H) 8.3 11.1(H)  Hemoglobin 12.0 - 15.0 g/dL 1.6(X) 10.9(L) 11.5(L)  Hematocrit 36.0 - 46.0 % 26.9(L) 32.7(L) 33.7(L)  Platelets 150 - 400 K/uL 187 228 249     Assessment/Plan: Status post vaginal delivery day 1. Anemia--chronic, exacerbated by blood loss Stable Continue current care. Orthostatic vs Fe BID Recheck CBC tomorrow. Reviewed options for birth control--patient will consider. Plan for discharge tomorrow.    Rayonna Heldman, VICKICNM 03/07/2015, 9:45 AM

## 2015-03-07 NOTE — Anesthesia Postprocedure Evaluation (Signed)
Anesthesia Post Note  Patient: Amanda Wise  Procedure(s) Performed: * No procedures listed *  Patient location during evaluation: Mother Baby Anesthesia Type: Epidural Level of consciousness: awake and alert, oriented and patient cooperative Pain management: pain level controlled Vital Signs Assessment: post-procedure vital signs reviewed and stable Respiratory status: spontaneous breathing and nonlabored ventilation Cardiovascular status: stable Postop Assessment: no headache, no backache, no signs of nausea or vomiting, adequate PO intake and patient able to bend at knees Comments: Pain level 2, stitches pain per patient    Last Vitals:  Filed Vitals:   03/07/15 0540 03/07/15 0910  BP: 136/86 140/85  Pulse: 72 83  Temp: 36.4 C 36.9 C  Resp: 18 18    Last Pain:  Filed Vitals:   03/07/15 0934  PainSc: 1                  Porchia Sinkler

## 2015-03-07 NOTE — Progress Notes (Signed)
UR chart review completed.  

## 2015-03-07 NOTE — Lactation Note (Signed)
This note was copied from the chart of Amanda Cicely Nute. Lactation Consultation Note Difficulty latching baby d/t location of flat nipple and NS will not stay on. Agreed to pump and bottle feed. Mom shown how to use DEBP & how to disassemble, clean, & reassemble parts.Mom knows to pump q3h for 15-20 min.Mom encouraged to feed baby 8-12 times/24 hours and with feeding cues.  Patient Name: Amanda Wise [id number] Date: 03/07/2015 Reason for consult: Follow-up assessment;Difficult latch   Maternal Data Has patient been taught Hand Expression?: Yes  Feeding Feeding Type: Breast Fed Length of feed: 0 min  LATCH Score/Interventions Latch: Too sleepy or reluctant, no latch achieved, no sucking elicited. Intervention(s): Adjust position;Assist with latch;Breast massage;Breast compression  Audible Swallowing: None Intervention(s): Hand expression  Type of Nipple: Everted at rest and after stimulation  Comfort (Breast/Nipple): Soft / non-tender     Hold (Positioning): Full assist, staff holds infant at breast Intervention(s): Breastfeeding basics reviewed;Support Pillows;Position options;Skin to skin  LATCH Score: 4  Lactation Tools Discussed/Used Tools: Pump Nipple shield size: 20;24 Breast pump type: Double-Electric Breast Pump Pump Review: Setup, frequency, and cleaning;Milk Storage Initiated by:: Peri Jefferson RN Date initiated:: 03/07/15   Consult Status Consult Status: Follow-up Date: 03/08/15 Follow-up type: In-patient    Maansi Wike, Diamond Nickel 03/07/2015, 6:20 AM

## 2015-03-08 ENCOUNTER — Ambulatory Visit: Payer: Self-pay

## 2015-03-08 LAB — CBC
HEMATOCRIT: 28 % — AB (ref 36.0–46.0)
HEMOGLOBIN: 9.5 g/dL — AB (ref 12.0–15.0)
MCH: 30.8 pg (ref 26.0–34.0)
MCHC: 33.9 g/dL (ref 30.0–36.0)
MCV: 90.9 fL (ref 78.0–100.0)
Platelets: 198 10*3/uL (ref 150–400)
RBC: 3.08 MIL/uL — ABNORMAL LOW (ref 3.87–5.11)
RDW: 15.6 % — ABNORMAL HIGH (ref 11.5–15.5)
WBC: 12.4 10*3/uL — AB (ref 4.0–10.5)

## 2015-03-08 MED ORDER — FERROUS SULFATE 325 (65 FE) MG PO TBEC: 325.0000 mg | DELAYED_RELEASE_TABLET | Freq: Two times a day (BID) | ORAL | Status: DC

## 2015-03-08 MED ORDER — IBUPROFEN 600 MG PO TABS: 600.0000 mg | ORAL_TABLET | Freq: Four times a day (QID) | ORAL | Status: DC

## 2015-03-08 NOTE — Discharge Summary (Signed)
OB Discharge Summary  Patient Name: Amanda Wise DOB: [birth year] MRN: [id number]  Date of admission: 03/03/2015 Delivering MD: Nigel Bridgeman   Date of discharge: 03/08/2015  Admitting diagnosis: 37w fell, baby movement Intrauterine pregnancy: [redacted]w[redacted]d     Secondary diagnosis:Principal Problem:   Vaginal delivery Active Problems:   Status post fall   Abnormal antenatal test   Antepartum non-reassuring fetal heart rate or rhythm affecting care of mother   Positive GBS test  Additional problems:none     Discharge diagnosis: Term Pregnancy Delivered and anemia                                                                     Post partum procedures:n/a  Augmentation: AROM, Pitocin, Cytotec and Foley Balloon  Complications: None  Hospital course:  Induction of Labor With Vaginal Delivery   30 y.o. yo G1P1001 at [redacted]w[redacted]d was admitted to the hospital 03/03/2015 for induction of labor.  Indication for induction: NR NST and BPP 4/8.  Patient had an uncomplicated labor course as follows: Membrane Rupture Time/Date: 11:15 AM ,03/06/2015   Intrapartum Procedures: Episiotomy: None [1]                                         Lacerations:  1st degree [2]  Patient had delivery of a Viable infant.  Information for the patient's newborn:  Khalani, Novoa [[id number]]  Delivery Method: Vaginal, Spontaneous Delivery (Filed from Delivery Summary)   03/06/2015  Details of delivery can be found in separate delivery note.  Patient had a routine postpartum course. Patient is discharged home 03/08/2015.   Physical exam  Filed Vitals:   03/07/15 0910 03/07/15 1300 03/07/15 1819 03/08/15 0608  BP: 140/85  140/73 125/78  Pulse: 83  76 73  Temp: 98.4 F (36.9 C) 98.3 F (36.8 C) 98.2 F (36.8 C) 98.3 F (36.8 C)  TempSrc: Oral Oral Oral Oral  Resp: 18 16 18 18   Height:      Weight:      SpO2: 99% 99%     General: alert, cooperative and no distress Lochia: appropriate Uterine  Fundus: firm Incision: Healing well with no significant drainage DVT Evaluation: No evidence of DVT seen on physical exam. Labs: Lab Results  Component Value Date   WBC 12.4* 03/08/2015   HGB 9.5* 03/08/2015   HCT 28.0* 03/08/2015   MCV 90.9 03/08/2015   PLT 198 03/08/2015   CMP Latest Ref Rng 02/13/2014  Glucose 70 - 99 mg/dL 89  BUN 6 - 23 mg/dL 13  Creatinine 4.25 - 9.56 mg/dL 3.87  Sodium 564 - 332 mmol/L 139  Potassium 3.5 - 5.1 mmol/L 4.2  Chloride 96 - 112 mEq/L 109  CO2 19 - 32 mmol/L 24  Calcium 8.4 - 10.5 mg/dL 8.9  Total Protein 6.0 - 8.3 g/dL 8.1  Total Bilirubin 0.3 - 1.2 mg/dL 0.4  Alkaline Phos 39 - 117 U/L 62  AST 0 - 37 U/L 25  ALT 0 - 35 U/L 20    Discharge instruction: per After Visit Summary and "Baby and Me Booklet".  Medications:  Current facility-administered medications:  .  acetaminophen (TYLENOL) tablet 650 mg, 650 mg, Oral, Q4H PRN, Nigel Bridgeman, CNM .  benzocaine-Menthol (DERMOPLAST) 20-0.5 % topical spray 1 application, 1 application, Topical, PRN, Nigel Bridgeman, CNM, 1 application at 03/06/15 1800 .  witch hazel-glycerin (TUCKS) pad 1 application, 1 application, Topical, PRN **AND** dibucaine (NUPERCAINAL) 1 % rectal ointment 1 application, 1 application, Rectal, PRN, Nigel Bridgeman, CNM .  diphenhydrAMINE (BENADRYL) capsule 25 mg, 25 mg, Oral, Q6H PRN, Nigel Bridgeman, CNM .  ferrous sulfate tablet 325 mg, 325 mg, Oral, BID WC, Nigel Bridgeman, CNM, 325 mg at 03/07/15 1708 .  ibuprofen (ADVIL,MOTRIN) tablet 600 mg, 600 mg, Oral, 4 times per day, Nigel Bridgeman, CNM, 600 mg at 03/08/15 0535 .  lanolin ointment, , Topical, PRN, Nigel Bridgeman, CNM .  lidocaine (PF) (XYLOCAINE) 1 % injection 30 mL, 30 mL, Subcutaneous, PRN, Nigel Bridgeman, CNM, 30 mL at 03/06/15 1510 .  ondansetron (ZOFRAN) tablet 4 mg, 4 mg, Oral, Q4H PRN **OR** ondansetron (ZOFRAN) injection 4 mg, 4 mg, Intravenous, Q4H PRN, Nigel Bridgeman, CNM .  prenatal multivitamin tablet 1 tablet, 1 tablet,  Oral, Q1200, Nigel Bridgeman, CNM, 1 tablet at 03/07/15 1210 .  senna-docusate (Senokot-S) tablet 2 tablet, 2 tablet, Oral, Q24H, Nigel Bridgeman, CNM, 2 tablet at 03/07/15 2301 .  simethicone (MYLICON) chewable tablet 80 mg, 80 mg, Oral, PRN, Nigel Bridgeman, CNM .  Tdap (BOOSTRIX) injection 0.5 mL, 0.5 mL, Intramuscular, Once, Nigel Bridgeman, CNM, 0.5 mL at 03/07/15 1602 .  zolpidem (AMBIEN) tablet 5 mg, 5 mg, Oral, QHS PRN, Nigel Bridgeman, CNM  Facility-Administered Medications Ordered in Other Encounters:  .  lidocaine (PF) (XYLOCAINE) 1 % injection, , , Anesthesia Intra-op, Sheldon Silvan, MD, 4 mL at 03/06/15 1406 After Visit Meds:    Medication List    ASK your doctor about these medications        ferrous sulfate 325 (65 FE) MG tablet  Take 325 mg by mouth daily with breakfast.     prenatal multivitamin Tabs tablet  Take 1 tablet by mouth daily at 12 noon.        Diet: routine diet  Activity: Advance as tolerated. Pelvic rest for 6 weeks.   Outpatient follow up:6 weeks Follow up Appt:No future appointments. Follow up visit: No Follow-up on file.  Postpartum contraception: Undecided  Newborn Data: Live born female  Birth Weight: 7 lb 4.2 oz (3294 g) APGAR: 9, 9  Baby Feeding: Breast/bottle Disposition:home with mother   03/08/2015 Jaion Lagrange, CNM      Postpartum Care After Vaginal Delivery  After you deliver your newborn (postpartum period), the usual stay in the hospital is 24 72 hours. If there were problems with your labor or delivery, or if you have other medical problems, you might be in the hospital longer.  While you are in the hospital, you will receive help and instructions on how to care for yourself and your newborn during the postpartum period.  While you are in the hospital:  Be sure to tell your nurses if you have pain or discomfort, as well as where you feel the pain and what makes the pain worse.  If you had an incision made near your vagina  (episiotomy) or if you had some tearing during delivery, the nurses may put ice packs on your episiotomy or tear. The ice packs may help to reduce the pain and swelling.  If you are breastfeeding, you may feel uncomfortable contractions of your uterus for a couple of weeks. This is normal. The  contractions help your uterus get back to normal size.  It is normal to have some bleeding after delivery.  For the first 1 3 days after delivery, the flow is red and the amount may be similar to a period.  It is common for the flow to start and stop.  In the first few days, you may pass some small clots. Let your nurses know if you begin to pass large clots or your flow increases.  Do not  flush blood clots down the toilet before having the nurse look at them.  During the next 3 10 days after delivery, your flow should become more watery and pink or brown-tinged in color.  Ten to fourteen days after delivery, your flow should be a small amount of yellowish-white discharge.  The amount of your flow will decrease over the first few weeks after delivery. Your flow may stop in 6 8 weeks. Most women have had their flow stop by 12 weeks after delivery.  You should change your sanitary pads frequently.  Wash your hands thoroughly with soap and water for at least 20 seconds after changing pads, using the toilet, or before holding or feeding your newborn.  You should feel like you need to empty your bladder within the first 6 8 hours after delivery.  In case you become weak, lightheaded, or faint, call your nurse before you get out of bed for the first time and before you take a shower for the first time.  Within the first few days after delivery, your breasts may begin to feel tender and full. This is called engorgement. Breast tenderness usually goes away within 48 72 hours after engorgement occurs. You may also notice milk leaking from your breasts. If you are not breastfeeding, do not stimulate your  breasts. Breast stimulation can make your breasts produce more milk.  Spending as much time as possible with your newborn is very important. During this time, you and your newborn can feel close and get to know each other. Having your newborn stay in your room (rooming in) will help to strengthen the bond with your newborn. It will give you time to get to know your newborn and become comfortable caring for your newborn.  Your hormones change after delivery. Sometimes the hormone changes can temporarily cause you to feel sad or tearful. These feelings should not last more than a few days. If these feelings last longer than that, you should talk to your caregiver.  If desired, talk to your caregiver about methods of family planning or contraception.  Talk to your caregiver about immunizations. Your caregiver may want you to have the following immunizations before leaving the hospital:  Tetanus, diphtheria, and pertussis (Tdap) or tetanus and diphtheria (Td) immunization. It is very important that you and your family (including grandparents) or others caring for your newborn are up-to-date with the Tdap or Td immunizations. The Tdap or Td immunization can help protect your newborn from getting ill.  Rubella immunization.  Varicella (chickenpox) immunization.  Influenza immunization. You should receive this annual immunization if you did not receive the immunization during your pregnancy. Document Released: 11/19/2006 Document Revised: 10/17/2011 Document Reviewed: 09/19/2011 [facility] Patient Information 2014 [place], [place].   Postpartum Depression and Baby Blues  The postpartum period begins right after the birth of a baby. During this time, there is often a great amount of joy and excitement. It is also a time of considerable changes in the life of the parent(s). Regardless of how many  times a mother gives birth, each child brings new challenges and dynamics to the family. It is not unusual to  have feelings of excitement accompanied by confusing shifts in moods, emotions, and thoughts. All mothers are at risk of developing postpartum depression or the "baby blues." These mood changes can occur right after giving birth, or they may occur many months after giving birth. The baby blues or postpartum depression can be mild or severe. Additionally, postpartum depression can resolve rather quickly, or it can be a long-term condition. CAUSES Elevated hormones and their rapid decline are thought to be a main cause of postpartum depression and the baby blues. There are a number of hormones that radically change during and after pregnancy. Estrogen and progesterone usually decrease immediately after delivering your baby. The level of thyroid hormone and various cortisol steroids also rapidly drop. Other factors that play a major role in these changes include major life events and genetics.  RISK FACTORS If you have any of the following risks for the baby blues or postpartum depression, know what symptoms to watch out for during the postpartum period. Risk factors that may increase the likelihood of getting the baby blues or postpartum depression include: 1. Havinga personal or family history of depression. 2. Having depression while being pregnant. 3. Having premenstrual or oral contraceptive-associated mood issues. 4. Having exceptional life stress. 5. Having marital conflict. 6. Lacking a social support network. 7. Having a baby with special needs. 8. Having health problems such as diabetes. SYMPTOMS Baby blues symptoms include:  Brief fluctuations in mood, such as going from extreme happiness to sadness.  Decreased concentration.  Difficulty sleeping.  Crying spells, tearfulness.  Irritability.  Anxiety. Postpartum depression symptoms typically begin within the first month after giving birth. These symptoms include:  Difficulty sleeping or excessive sleepiness.  Marked weight  loss.  Agitation.  Feelings of worthlessness.  Lack of interest in activity or food. Postpartum psychosis is a very concerning condition and can be dangerous. Fortunately, it is rare. Displaying any of the following symptoms is cause for immediate medical attention. Postpartum psychosis symptoms include:  Hallucinations and delusions.  Bizarre or disorganized behavior.  Confusion or disorientation. DIAGNOSIS  A diagnosis is made by an evaluation of your symptoms. There are no medical or lab tests that lead to a diagnosis, but there are various questionnaires that a caregiver may use to identify those with the baby blues, postpartum depression, or psychosis. Often times, a screening tool called the [place] Postnatal Depression Scale is used to diagnose depression in the postpartum period.  TREATMENT The baby blues usually goes away on its own in 1 to 2 weeks. Social support is often all that is needed. You should be encouraged to get adequate sleep and rest. Occasionally, you may be given medicines to help you sleep.  Postpartum depression requires treatment as it can last several months or longer if it is not treated. Treatment may include individual or group therapy, medicine, or both to address any social, physiological, and psychological factors that may play a role in the depression. Regular exercise, a healthy diet, rest, and social support may also be strongly recommended.  Postpartum psychosis is more serious and needs treatment right away. Hospitalization is often needed. HOME CARE INSTRUCTIONS  Get as much rest as you can. Nap when the baby sleeps.  Exercise regularly. Some women find yoga and walking to be beneficial.  Eat a balanced and nourishing diet.  Do little things that you enjoy. Have a  cup of tea, take a bubble bath, read your favorite magazine, or listen to your favorite music.  Avoid alcohol.  Ask for help with household chores, cooking, grocery shopping, or  running errands as needed. Do not try to do everything.  Talk to people close to you about how you are feeling. Get support from your partner, family members, friends, or other new moms.  Try to stay positive in how you think. Think about the things you are grateful for.  Do not spend a lot of time alone.  Only take medicine as directed by your caregiver.  Keep all your postpartum appointments.  Let your caregiver know if you have any concerns. SEEK MEDICAL CARE IF: You are having a reaction or problems with your medicine. SEEK IMMEDIATE MEDICAL CARE IF:  You have suicidal feelings.  You feel you may harm the baby or someone else. Document Released: 10/27/2003 Document Revised: 04/16/2011 Document Reviewed: 11/28/2010 [facility] Patient Information 2014 [place], [place].     Breastfeeding Deciding to breastfeed is one of the best choices you can make for you and your baby. A change in hormones during pregnancy causes your breast tissue to grow and increases the number and size of your milk ducts. These hormones also allow proteins, sugars, and fats from your blood supply to make breast milk in your milk-producing glands. Hormones prevent breast milk from being released before your baby is born as well as prompt milk flow after birth. Once breastfeeding has begun, thoughts of your baby, as well as his or her sucking or crying, can stimulate the release of milk from your milk-producing glands.  BENEFITS OF BREASTFEEDING For Your Baby  Your first milk (colostrum) helps your baby's digestive system function better.   There are antibodies in your milk that help your baby fight off infections.   Your baby has a lower incidence of asthma, allergies, and sudden infant death syndrome.   The nutrients in breast milk are better for your baby than infant formulas and are designed uniquely for your baby's needs.   Breast milk improves your baby's brain development.   Your baby is less  likely to develop other conditions, such as childhood obesity, asthma, or type 2 diabetes mellitus.  For You   Breastfeeding helps to create a very special bond between you and your baby.   Breastfeeding is convenient. Breast milk is always available at the correct temperature and costs nothing.   Breastfeeding helps to burn calories and helps you lose the weight gained during pregnancy.   Breastfeeding makes your uterus contract to its prepregnancy size faster and slows bleeding (lochia) after you give birth.   Breastfeeding helps to lower your risk of developing type 2 diabetes mellitus, osteoporosis, and breast or ovarian cancer later in life. SIGNS THAT YOUR BABY IS HUNGRY Early Signs of Hunger  Increased alertness or activity.  Stretching.  Movement of the head from side to side.  Movement of the head and opening of the mouth when the corner of the mouth or cheek is stroked (rooting).  Increased sucking sounds, smacking lips, cooing, sighing, or squeaking.  Hand-to-mouth movements.  Increased sucking of fingers or hands. Late Signs of Hunger  Fussing.  Intermittent crying. Extreme Signs of Hunger Signs of extreme hunger will require calming and consoling before your baby will be able to breastfeed successfully. Do not wait for the following signs of extreme hunger to occur before you initiate breastfeeding:   Restlessness.  A loud, strong cry.  Screaming.   BREASTFEEDING BASICS Breastfeeding Initiation  Find a comfortable place to sit or lie down, with your neck and back well supported.  Place a pillow or rolled up blanket under your baby to bring him or her to the level of your breast (if you are seated). Nursing pillows are specially designed to help support your arms and your baby while you breastfeed.  Make sure that your baby's abdomen is facing your abdomen.   Gently massage your breast. With your fingertips, massage from your chest wall toward  your nipple in a circular motion. This encourages milk flow. You may need to continue this action during the feeding if your milk flows slowly.  Support your breast with 4 fingers underneath and your thumb above your nipple. Make sure your fingers are well away from your nipple and your baby's mouth.   Stroke your baby's lips gently with your finger or nipple.   When your baby's mouth is open wide enough, quickly bring your baby to your breast, placing your entire nipple and as much of the colored area around your nipple (areola) as possible into your baby's mouth.   More areola should be visible above your baby's upper lip than below the lower lip.   Your baby's tongue should be between his or her lower gum and your breast.   Ensure that your baby's mouth is correctly positioned around your nipple (latched). Your baby's lips should create a seal on your breast and be turned out (everted).  It is common for your baby to suck about 2-3 minutes in order to start the flow of breast milk. Latching Teaching your baby how to latch on to your breast properly is very important. An improper latch can cause nipple pain and decreased milk supply for you and poor weight gain in your baby. Also, if your baby is not latched onto your nipple properly, he or she may swallow some air during feeding. This can make your baby fussy. Burping your baby when you switch breasts during the feeding can help to get rid of the air. However, teaching your baby to latch on properly is still the best way to prevent fussiness from swallowing air while breastfeeding. Signs that your baby has successfully latched on to your nipple:    Silent tugging or silent sucking, without causing you pain.   Swallowing heard between every 3-4 sucks.    Muscle movement above and in front of his or her ears while sucking.  Signs that your baby has not successfully latched on to nipple:   Sucking sounds or smacking sounds from  your baby while breastfeeding.  Nipple pain. If you think your baby has not latched on correctly, slip your finger into the corner of your baby's mouth to break the suction and place it between your baby's gums. Attempt breastfeeding initiation again. Signs of Successful Breastfeeding Signs from your baby:   A gradual decrease in the number of sucks or complete cessation of sucking.   Falling asleep.   Relaxation of his or her body.   Retention of a small amount of milk in his or her mouth.   Letting go of your breast by himself or herself. Signs from you:  Breasts that have increased in firmness, weight, and size 1-3 hours after feeding.   Breasts that are softer immediately after breastfeeding.  Increased milk volume, as well as a change in milk consistency and color by the fifth day of breastfeeding.   Nipples that are  not sore, cracked, or bleeding. Signs That Your Pecola Leisure is Getting Enough Milk  Wetting at least 3 diapers in a 24-hour period. The urine should be clear and pale yellow by age 15 days.  At least 3 stools in a 24-hour period by age 15 days. The stool should be soft and yellow.  At least 3 stools in a 24-hour period by age 38 days. The stool should be seedy and yellow.  No loss of weight greater than 10% of birth weight during the first 19 days of age.  Average weight gain of 4-7 ounces (113-198 g) per week after age 82 days.  Consistent daily weight gain by age 15 days, without weight loss after the age of 2 weeks. After a feeding, your baby may spit up a small amount. This is common. BREASTFEEDING FREQUENCY AND DURATION Frequent feeding will help you make more milk and can prevent sore nipples and breast engorgement. Breastfeed when you feel the need to reduce the fullness of your breasts or when your baby shows signs of hunger. This is called "breastfeeding on demand." Avoid introducing a pacifier to your baby while you are working to establish breastfeeding  (the first 4-6 weeks after your baby is born). After this time you may choose to use a pacifier. Research has shown that pacifier use during the first year of a baby's life decreases the risk of sudden infant death syndrome (SIDS). Allow your baby to feed on each breast as long as he or she wants. Breastfeed until your baby is finished feeding. When your baby unlatches or falls asleep while feeding from the first breast, offer the second breast. Because newborns are often sleepy in the first few weeks of life, you may need to awaken your baby to get him or her to feed. Breastfeeding times will vary from baby to baby. However, the following rules can serve as a guide to help you ensure that your baby is properly fed:  Newborns (babies 43 weeks of age or younger) may breastfeed every 1-3 hours.  Newborns should not go longer than 3 hours during the day or 5 hours during the night without breastfeeding.  You should breastfeed your baby a minimum of 8 times in a 24-hour period until you begin to introduce solid foods to your baby at around 12 months of age. BREAST MILK PUMPING Pumping and storing breast milk allows you to ensure that your baby is exclusively fed your breast milk, even at times when you are unable to breastfeed. This is especially important if you are going back to work while you are still breastfeeding or when you are not able to be present during feedings. Your lactation consultant can give you guidelines on how long it is safe to store breast milk.  A breast pump is a machine that allows you to pump milk from your breast into a sterile bottle. The pumped breast milk can then be stored in a refrigerator or freezer. Some breast pumps are operated by hand, while others use electricity. Ask your lactation consultant which type will work best for you. Breast pumps can be purchased, but some hospitals and breastfeeding support groups lease breast pumps on a monthly basis. A lactation consultant can  teach you how to hand express breast milk, if you prefer not to use a pump.  CARING FOR YOUR BREASTS WHILE YOU BREASTFEED Nipples can become dry, cracked, and sore while breastfeeding. The following recommendations can help keep your breasts moisturized and healthy:  Avoid  using soap on your nipples.   Wear a supportive bra. Although not required, special nursing bras and tank tops are designed to allow access to your breasts for breastfeeding without taking off your entire bra or top. Avoid wearing underwire-style bras or extremely tight bras.  Air dry your nipples for 3-32minutes after each feeding.   Use only cotton bra pads to absorb leaked breast milk. Leaking of breast milk between feedings is normal.   Use lanolin on your nipples after breastfeeding. Lanolin helps to maintain your skin's normal moisture barrier. If you use pure lanolin, you do not need to wash it off before feeding your baby again. Pure lanolin is not toxic to your baby. You may also hand express a few drops of breast milk and gently massage that milk into your nipples and allow the milk to air dry. In the first few weeks after giving birth, some women experience extremely full breasts (engorgement). Engorgement can make your breasts feel heavy, warm, and tender to the touch. Engorgement peaks within 3-5 days after you give birth. The following recommendations can help ease engorgement:  Completely empty your breasts while breastfeeding or pumping. You may want to start by applying warm, moist heat (in the shower or with warm water-soaked hand towels) just before feeding or pumping. This increases circulation and helps the milk flow. If your baby does not completely empty your breasts while breastfeeding, pump any extra milk after he or she is finished.  Wear a snug bra (nursing or regular) or tank top for 1-2 days to signal your body to slightly decrease milk production.  Apply ice packs to your breasts, unless this is  too uncomfortable for you.  Make sure that your baby is latched on and positioned properly while breastfeeding. If engorgement persists after 48 hours of following these recommendations, contact your health care provider or a Advertising copywriter. OVERALL HEALTH CARE RECOMMENDATIONS WHILE BREASTFEEDING  Eat healthy foods. Alternate between meals and snacks, eating 3 of each per day. Because what you eat affects your breast milk, some of the foods may make your baby more irritable than usual. Avoid eating these foods if you are sure that they are negatively affecting your baby.  Drink milk, fruit juice, and water to satisfy your thirst (about 10 glasses a day).   Rest often, relax, and continue to take your prenatal vitamins to prevent fatigue, stress, and anemia.  Continue breast self-awareness checks.  Avoid chewing and smoking tobacco.  Avoid alcohol and drug use. Some medicines that may be harmful to your baby can pass through breast milk. It is important to ask your health care provider before taking any medicine, including all over-the-counter and prescription medicine as well as vitamin and herbal supplements. It is possible to become pregnant while breastfeeding. If birth control is desired, ask your health care provider about options that will be safe for your baby. SEEK MEDICAL CARE IF:   You feel like you want to stop breastfeeding or have become frustrated with breastfeeding.  You have painful breasts or nipples.  Your nipples are cracked or bleeding.  Your breasts are red, tender, or warm.  You have a swollen area on either breast.  You have a fever or chills.  You have nausea or vomiting.  You have drainage other than breast milk from your nipples.  Your breasts do not become full before feedings by the fifth day after you give birth.  You feel sad and depressed.  Your baby is too  sleepy to eat well.  Your baby is having trouble sleeping.   Your baby is  wetting less than 3 diapers in a 24-hour period.  Your baby has less than 3 stools in a 24-hour period.  Your baby's skin or the white part of his or her eyes becomes yellow.   Your baby is not gaining weight by 44 days of age. SEEK IMMEDIATE MEDICAL CARE IF:   Your baby is overly tired (lethargic) and does not want to wake up and feed.  Your baby develops an unexplained fever. Document Released: 01/22/2005 Document Revised: 01/27/2013 Document Reviewed: 07/16/2012 [facility] Patient Information 2015 [place], [place]. This information is not intended to replace advice given to you by your health care provider. Make sure you discuss any questions you have with your health care provider.

## 2015-03-08 NOTE — Lactation Note (Signed)
This note was copied from the chart of [name]. Lactation Consultation Note: Mother assist with latching infant . Infant tongue thrust and was on and off the breast for 10-15 mins. Mother has not been pumping with electric pump. She has been mostly bottle feeding. She has a hand pump with instructions to post pump every 2-3 hours for 15 mins on each breast. Mother was fit with a #20 nipple shield. Infant latched with better depth with shield. Mother was instructed to follow up with lactation services or community support. She is unsure is she will continue to breastfeed. Mother advised in treatment to prevent engorgement.   Patient Name: [name] Date: 03/08/2015     Maternal Data    Feeding    LATCH Score/Interventions                      Lactation Tools Discussed/Used     Consult Status      Michel Bickers 03/08/2015, 3:41 PM

## 2015-03-08 NOTE — Anesthesia Postprocedure Evaluation (Signed)
Anesthesia Post Note  Patient: Amanda Wise  Procedure(s) Performed: * No procedures listed *  Anesthesia Type: Epidural    Last Vitals: There were no vitals filed for this visit.  Last Pain: There were no vitals filed for this visit.               Macon Lesesne A

## 2016-01-26 LAB — OB RESULTS CONSOLE GC/CHLAMYDIA: Chlamydia: NEGATIVE

## 2016-01-26 LAB — OB RESULTS CONSOLE RUBELLA ANTIBODY, IGM: RUBELLA: IMMUNE

## 2016-01-26 LAB — OB RESULTS CONSOLE HEPATITIS B SURFACE ANTIGEN: HEP B S AG: NEGATIVE

## 2016-01-26 LAB — OB RESULTS CONSOLE ABO/RH: RH TYPE: POSITIVE

## 2016-01-26 LAB — OB RESULTS CONSOLE RPR: RPR: NONREACTIVE

## 2016-01-26 LAB — OB RESULTS CONSOLE ANTIBODY SCREEN: Antibody Screen: NEGATIVE

## 2016-01-26 LAB — OB RESULTS CONSOLE HIV ANTIBODY (ROUTINE TESTING): HIV: NONREACTIVE

## 2016-01-27 ENCOUNTER — Other Ambulatory Visit (HOSPITAL_COMMUNITY): Payer: Self-pay | Admitting: Nurse Practitioner

## 2016-01-27 DIAGNOSIS — Z3A13 13 weeks gestation of pregnancy: Secondary | ICD-10-CM

## 2016-01-27 DIAGNOSIS — Z3682 Encounter for antenatal screening for nuchal translucency: Secondary | ICD-10-CM

## 2016-02-06 NOTE — L&D Delivery Note (Addendum)
31 y.o. G2P1001 at [redacted]w[redacted]d delivered a viable female infant in cephalic, ROP position. No nuchal cord. Left anterior shoulder delivered with ease. 60 sec delayed cord clamping. Cord clamped x2 and cut. Placenta delivered spontaneously, with 3VC. However, membranes detached and were retained in the uterus. Manual extraction was performed with adequate removal from uterus. Fundus firm on exam with massage and pitocin. Added methergine series to prophylactically prevent further bleeding.Good hemostasis noted.  Anesthesia: Epidural Laceration: second degree Suture: 3.0 Vicryl Good hemostasis noted. EBL: 500cc  Mom and baby recovering in LDR.    Apgars: APGAR (1 MIN): 8   APGAR (5 MINS): 9   Weight: 9lb 9.8oz; 4360g  Sponge and instrument count were correct x2. Placenta sent to L&D.  [place], DO FM Resident PGY-1 08/07/2016 7:50 PM    OB FELLOW DELIVERY ATTESTATION  I was gloved and present for the delivery in its entirety, and I agree with the above resident's note.    Jen Mow, DO OB Fellow 9:26 PM

## 2016-02-08 ENCOUNTER — Encounter (HOSPITAL_COMMUNITY): Payer: Self-pay | Admitting: *Deleted

## 2016-02-09 ENCOUNTER — Encounter (HOSPITAL_COMMUNITY): Payer: Self-pay

## 2016-02-09 ENCOUNTER — Ambulatory Visit (HOSPITAL_COMMUNITY)
Admission: RE | Admit: 2016-02-09 | Discharge: 2016-02-09 | Disposition: A | Discharge status: Home / Self Care | Payer: Medicaid Other | Source: Ambulatory Visit | Attending: Nurse Practitioner | Admitting: Nurse Practitioner

## 2016-02-09 DIAGNOSIS — Z3A13 13 weeks gestation of pregnancy: Secondary | ICD-10-CM | POA: Diagnosis not present

## 2016-02-09 DIAGNOSIS — O99211 Obesity complicating pregnancy, first trimester: Secondary | ICD-10-CM | POA: Diagnosis not present

## 2016-02-09 DIAGNOSIS — Z3682 Encounter for antenatal screening for nuchal translucency: Secondary | ICD-10-CM | POA: Diagnosis present

## 2016-02-09 DIAGNOSIS — O09291 Supervision of pregnancy with other poor reproductive or obstetric history, first trimester: Secondary | ICD-10-CM | POA: Insufficient documentation

## 2016-02-14 ENCOUNTER — Other Ambulatory Visit: Payer: Self-pay | Admitting: Nurse Practitioner

## 2016-04-01 ENCOUNTER — Inpatient Hospital Stay (HOSPITAL_COMMUNITY)
Admission: AD | Admit: 2016-04-01 | Discharge: 2016-04-01 | Disposition: A | Discharge status: Home / Self Care | Payer: Medicaid Other | Source: Ambulatory Visit | Attending: Obstetrics and Gynecology | Admitting: Obstetrics and Gynecology

## 2016-04-01 DIAGNOSIS — Z3A21 21 weeks gestation of pregnancy: Secondary | ICD-10-CM | POA: Insufficient documentation

## 2016-04-01 DIAGNOSIS — O26892 Other specified pregnancy related conditions, second trimester: Secondary | ICD-10-CM

## 2016-04-01 DIAGNOSIS — R51 Headache: Secondary | ICD-10-CM | POA: Diagnosis not present

## 2016-04-01 DIAGNOSIS — O99352 Diseases of the nervous system complicating pregnancy, second trimester: Secondary | ICD-10-CM | POA: Insufficient documentation

## 2016-04-01 DIAGNOSIS — H538 Other visual disturbances: Secondary | ICD-10-CM | POA: Diagnosis present

## 2016-04-01 DIAGNOSIS — O162 Unspecified maternal hypertension, second trimester: Secondary | ICD-10-CM | POA: Insufficient documentation

## 2016-04-01 LAB — URINALYSIS, ROUTINE W REFLEX MICROSCOPIC
Bilirubin Urine: NEGATIVE
Glucose, UA: NEGATIVE mg/dL
Hgb urine dipstick: NEGATIVE
Ketones, ur: NEGATIVE mg/dL
Leukocytes, UA: NEGATIVE
Nitrite: NEGATIVE
Protein, ur: NEGATIVE mg/dL
Specific Gravity, Urine: 1.014 (ref 1.005–1.030)
pH: 6 (ref 5.0–8.0)

## 2016-04-01 MED ORDER — IBUPROFEN 600 MG PO TABS
600.0000 mg | ORAL_TABLET | Freq: Once | ORAL | Status: AC
  Administered 2016-04-01: 600 mg via ORAL
  Filled 2016-04-01: qty 1

## 2016-04-01 NOTE — MAU Provider Note (Signed)
History   G2P1001 @ 21.1 wks in with headache and blurred vision. Pain is a 3 on scale of 1-10. Has not taken anything for her headache. Also has hx of migraine headaches. Also pt has not eaten today.  CSN: [id number]  Arrival date & time 04/01/16  1144   None     Chief Complaint  Patient presents with  . Blurred Vision    HPI  Past Medical History:  Diagnosis Date  . Anemia   . Hypertension     Past Surgical History:  Procedure Laterality Date  . NO PAST SURGERIES      Family History  Problem Relation Age of Onset  . Hypertension Other   . Hypertension Maternal Grandmother   . Hypertension Paternal Grandmother     Social History  Substance Use Topics  . Smoking status: Never Smoker  . Smokeless tobacco: Never Used  . Alcohol use No    OB History    Gravida Para Term Preterm AB Living   2 1 1     1    SAB TAB Ectopic Multiple Live Births         0 1      Review of Systems  Allergies  Patient has no known allergies.  Home Medications    BP 137/79 (BP Location: Right Arm)   Pulse 96   Temp 98.3 F (36.8 C) (Oral)   Resp 18   Ht 5\' 7"  (1.702 m)   Wt (!) 342 lb 1.3 oz (155.2 kg)   LMP 11/05/2015   BMI 53.58 kg/m   Physical Exam  Constitutional: She is oriented to person, place, and time. She appears well-developed and well-nourished.  HENT:  Head: Normocephalic.  Eyes: Conjunctivae are normal. Pupils are equal, round, and reactive to light.  Neck: Normal range of motion. Neck supple.  Cardiovascular: Normal rate, regular rhythm, normal heart sounds and intact distal pulses.   Pulmonary/Chest: Effort normal and breath sounds normal.  Abdominal: Soft. Bowel sounds are normal.  Genitourinary: Vagina normal and uterus normal.  Musculoskeletal: Normal range of motion.  Neurological: She is alert and oriented to person, place, and time. She has normal reflexes.  Skin: Skin is warm and dry.  Psychiatric: She has a normal mood and affect. Her  behavior is normal. Judgment and thought content normal.    MAU Course  Procedures (including critical care time)  Labs Reviewed  URINALYSIS, ROUTINE W REFLEX MICROSCOPIC   No results found.   No diagnosis found.    MDM  PERRLA,  VSS, FHT st and reg. Will treat with motrin and d/c home if headache improves.

## 2016-04-01 NOTE — MAU Note (Signed)
Patient presents with vision problems and a headache.

## 2016-04-01 NOTE — Discharge Instructions (Signed)
General Headache Without Cause A headache is pain or discomfort felt around the head or neck area. The specific cause of a headache may not be found. There are many causes and types of headaches. A few common ones are:  Tension headaches.  Migraine headaches.  Cluster headaches.  Chronic daily headaches.  Follow these instructions at home: Watch your condition for any changes. Take these steps to help with your condition: Managing pain  Take over-the-counter and prescription medicines only as told by your health care provider.  Lie down in a dark, quiet room when you have a headache.  If directed, apply ice to the head and neck area: ? Put ice in a plastic bag. ? Place a towel between your skin and the bag. ? Leave the ice on for 20 minutes, 2-3 times per day.  Use a heating pad or hot shower to apply heat to the head and neck area as told by your health care provider.  Keep lights dim if bright lights bother you or make your headaches worse. Eating and drinking  Eat meals on a regular schedule.  Limit alcohol use.  Decrease the amount of caffeine you drink, or stop drinking caffeine. General instructions  Keep all follow-up visits as told by your health care provider. This is important.  Keep a headache journal to help find out what may trigger your headaches. For example, write down: ? What you eat and drink. ? How much sleep you get. ? Any change to your diet or medicines.  Try massage or other relaxation techniques.  Limit stress.  Sit up straight, and do not tense your muscles.  Do not use tobacco products, including cigarettes, chewing tobacco, or e-cigarettes. If you need help quitting, ask your health care provider.  Exercise regularly as told by your health care provider.  Sleep on a regular schedule. Get 7-9 hours of sleep, or the amount recommended by your health care provider. Contact a health care provider if:  Your symptoms are not helped by  medicine.  You have a headache that is different from the usual headache.  You have nausea or you vomit.  You have a fever. Get help right away if:  Your headache becomes severe.  You have repeated vomiting.  You have a stiff neck.  You have a loss of vision.  You have problems with speech.  You have pain in the eye or ear.  You have muscular weakness or loss of muscle control.  You lose your balance or have trouble walking.  You feel faint or pass out.  You have confusion. This information is not intended to replace advice given to you by your health care provider. Make sure you discuss any questions you have with your health care provider. Document Released: 01/22/2005 Document Revised: 06/30/2015 Document Reviewed: 05/17/2014 Elsevier Interactive Patient Education  2017 [employer]  

## 2016-06-05 ENCOUNTER — Encounter (HOSPITAL_COMMUNITY): Payer: Self-pay | Admitting: *Deleted

## 2016-06-05 ENCOUNTER — Inpatient Hospital Stay (HOSPITAL_COMMUNITY)
Admission: AD | Admit: 2016-06-05 | Discharge: 2016-06-05 | Disposition: A | Discharge status: Home / Self Care | Payer: Medicaid Other | Source: Ambulatory Visit | Attending: Obstetrics and Gynecology | Admitting: Obstetrics and Gynecology

## 2016-06-05 DIAGNOSIS — Z3A30 30 weeks gestation of pregnancy: Secondary | ICD-10-CM | POA: Diagnosis not present

## 2016-06-05 DIAGNOSIS — O4693 Antepartum hemorrhage, unspecified, third trimester: Secondary | ICD-10-CM | POA: Insufficient documentation

## 2016-06-05 DIAGNOSIS — N939 Abnormal uterine and vaginal bleeding, unspecified: Secondary | ICD-10-CM | POA: Diagnosis present

## 2016-06-05 DIAGNOSIS — Z7982 Long term (current) use of aspirin: Secondary | ICD-10-CM | POA: Diagnosis not present

## 2016-06-05 DIAGNOSIS — O09893 Supervision of other high risk pregnancies, third trimester: Secondary | ICD-10-CM

## 2016-06-05 LAB — WET PREP, GENITAL
Clue Cells Wet Prep HPF POC: NONE SEEN
Sperm: NONE SEEN
Trich, Wet Prep: NONE SEEN
Yeast Wet Prep HPF POC: NONE SEEN

## 2016-06-05 LAB — URINALYSIS, ROUTINE W REFLEX MICROSCOPIC
BILIRUBIN URINE: NEGATIVE
GLUCOSE, UA: NEGATIVE mg/dL
Ketones, ur: NEGATIVE mg/dL
LEUKOCYTES UA: NEGATIVE
NITRITE: NEGATIVE
PROTEIN: NEGATIVE mg/dL
Specific Gravity, Urine: 1.004 — ABNORMAL LOW (ref 1.005–1.030)
pH: 7 (ref 5.0–8.0)

## 2016-06-05 NOTE — MAU Provider Note (Signed)
Chief Complaint:  Vaginal Bleeding   First Provider Initiated Contact with Patient 06/05/16 1011      HPI: Amanda Wise is a 31 y.o. G2P1001 at [redacted]w[redacted]d who presents to maternity admissions reporting onset of dark red vaginal bleeding with clots this morning at 5 am.  She reports she went to the bathroom at 5 am to urinate, and saw bright red bleeding on the tissue. She reports seeing the blood when wiping 2 other times, and there was light spotting in her underwear. She has not required a pad for the amount of bleeding.  She denies recent constipation and reports she had a bowel movement today after the bleeding started and did not strain when going.  Last intercourse was 2 weeks ago. She has not tried any treatments for bleeding. There is no pain or other associated symptoms.  Her pregnancy has been uncomplicated but she reports that her BP became elevated at around 30 weeks with her last pregnancy. She reports good fetal movement, denies LOF, vaginal itching/burning, urinary symptoms, h/a, dizziness, n/v, or fever/chills.    HPI  Past Medical History: Past Medical History:  Diagnosis Date  . Anemia   . Hypertension     Past obstetric history: OB History  Gravida Para Term Preterm AB Living  2 1 1     1   SAB TAB Ectopic Multiple Live Births        0 1    # Outcome Date GA Lbr Len/2nd Weight Sex Delivery Anes PTL Lv  2 Current           1 Term 03/06/15 [redacted]w[redacted]d 03:18 / 00:14 7 lb 4.2 oz (3.294 kg) F Vag-Spont EPI  LIV     Birth Comments: wnl      Past Surgical History: Past Surgical History:  Procedure Laterality Date  . NO PAST SURGERIES      Family History: Family History  Problem Relation Age of Onset  . Hypertension Other   . Hypertension Maternal Grandmother   . Hypertension Paternal Grandmother     Social History: Social History  Substance Use Topics  . Smoking status: Never Smoker  . Smokeless tobacco: Never Used  . Alcohol use No    Allergies: No Known  Allergies  Meds:  Prescriptions Prior to Admission  Medication Sig Dispense Refill Last Dose  . aspirin EC 81 MG tablet Take 81 mg by mouth daily.   06/03/2016  . Prenatal Vit-Fe Fumarate-FA (PRENATAL MULTIVITAMIN) TABS tablet Take 1 tablet by mouth daily.    06/03/2016    ROS:  Review of Systems  Constitutional: Negative for chills, fatigue and fever.  Eyes: Negative for visual disturbance.  Respiratory: Negative for shortness of breath.   Cardiovascular: Negative for chest pain.  Gastrointestinal: Negative for abdominal pain, nausea and vomiting.  Genitourinary: Positive for vaginal bleeding. Negative for difficulty urinating, dysuria, flank pain, pelvic pain, vaginal discharge and vaginal pain.  Neurological: Negative for dizziness and headaches.  Psychiatric/Behavioral: Negative.      I have reviewed patient's Past Medical Hx, Surgical Hx, Family Hx, Social Hx, medications and allergies.   Physical Exam   Patient Vitals for the past 24 hrs:  BP Temp Temp src Pulse Resp SpO2 Height Weight  06/05/16 1042 123/74 - - 92 18 99 % - -  06/05/16 0956 137/78 98.3 F (36.8 C) Oral (!) 103 18 99 % 5\' 7"  (1.702 m) (!) 348 lb 0.6 oz (157.9 kg)   Constitutional: Well-developed, well-nourished female in no acute  distress.  Cardiovascular: normal rate Respiratory: normal effort GI: Abd soft, non-tender, gravid appropriate for gestational age.  MS: Extremities nontender, no edema, normal ROM Neurologic: Alert and oriented x 4.  GU: Neg CVAT.  PELVIC EXAM: Cervix pink, visually closed, without lesion, nonfriable, scant white creamy discharge, no bleeding noted,  vaginal walls and external genitalia normal  Cervix 0/long/high, posterior, no blood on glove following exam  Dilation: Closed Cervical Position: Posterior Exam by:: Sharen Counter, CNM  FHT:  Baseline 145, moderate variability, accelerations present, no decelerations Contractions: None on toco or to palpation    Labs: Results for orders placed or performed during the hospital encounter of 06/05/16 (from the past 24 hour(s))  Urinalysis, Routine w reflex microscopic     Status: Abnormal   Collection Time: 06/05/16  9:40 AM  Result Value Ref Range   Color, Urine YELLOW YELLOW   APPearance CLEAR CLEAR   Specific Gravity, Urine 1.004 (L) 1.005 - 1.030   pH 7.0 5.0 - 8.0   Glucose, UA NEGATIVE NEGATIVE mg/dL   Hgb urine dipstick MODERATE (A) NEGATIVE   Bilirubin Urine NEGATIVE NEGATIVE   Ketones, ur NEGATIVE NEGATIVE mg/dL   Protein, ur NEGATIVE NEGATIVE mg/dL   Nitrite NEGATIVE NEGATIVE   Leukocytes, UA NEGATIVE NEGATIVE   RBC / HPF 0-5 0 - 5 RBC/hpf   WBC, UA 0-5 0 - 5 WBC/hpf   Bacteria, UA RARE (A) NONE SEEN   Squamous Epithelial / LPF 0-5 (A) NONE SEEN  Wet prep, genital     Status: Abnormal   Collection Time: 06/05/16 10:12 AM  Result Value Ref Range   Yeast Wet Prep HPF POC NONE SEEN NONE SEEN   Trich, Wet Prep NONE SEEN NONE SEEN   Clue Cells Wet Prep HPF POC NONE SEEN NONE SEEN   WBC, Wet Prep HPF POC FEW (A) NONE SEEN   Sperm NONE SEEN       Imaging:  No results found.  MAU Course/MDM: I have ordered labs and reviewed results.  NST reviewed No evidence of vaginal bleeding on exam, no evidence of preterm labor with closed cervix Urine with moderate hgb so sent for culture BP wnl today Bleeding may have been rectal but not prompted by bowel movement, or if vaginal bleeding it resolved spontaneously prior to MAU arrival with no blood in vaginal vault Reassurance provided and bleeding precautions reviewed with pt Pt to f/u at [facility] as scheduled Pt stable at time of discharge.  Today's evaluation included a work-up for preterm labor which can be life-threatening for both mom and baby.  Assessment: 1. Vaginal bleeding in pregnancy, third trimester   2. Previous pregnancy complicated by pregnancy-induced hypertension in third trimester, antepartum     Plan: Discharge home  with bleeding precautions Labor precautions and fetal kick counts  Follow-up Information    [facility] Follow up.   Why:  As scheduled, return to MAU as needed for emergencies Contact information: [address] [place] [address] [phone]          Allergies as of 06/05/2016   No Known Allergies     Medication List    TAKE these medications   aspirin EC 81 MG tablet Take 81 mg by mouth daily.   prenatal multivitamin Tabs tablet Take 1 tablet by mouth daily.       Sharen Counter Certified Nurse-Midwife 06/05/2016 10:45 AM

## 2016-06-05 NOTE — MAU Note (Signed)
Patient reports vaginal bleeding that started at 5am this morning; reports that it is like "a light period"--dark red in color. States the bleeding has been intermittent today. Not currently wearing a pad. Denies vaginal discharge. +FM. Denies LOF or contractions at this time. Does report increase in vaginal pressure.

## 2016-06-06 LAB — GC/CHLAMYDIA PROBE AMP (~~LOC~~) NOT AT ARMC
CHLAMYDIA, DNA PROBE: NEGATIVE
NEISSERIA GONORRHEA: NEGATIVE

## 2016-06-06 LAB — CULTURE, OB URINE: CULTURE: NO GROWTH

## 2016-06-10 ENCOUNTER — Encounter (HOSPITAL_COMMUNITY): Payer: Self-pay

## 2016-06-10 ENCOUNTER — Inpatient Hospital Stay (HOSPITAL_COMMUNITY)
Admission: AD | Admit: 2016-06-10 | Discharge: 2016-06-10 | Disposition: A | Discharge status: Home / Self Care | Payer: Medicaid Other | Source: Ambulatory Visit | Attending: Obstetrics and Gynecology | Admitting: Obstetrics and Gynecology

## 2016-06-10 DIAGNOSIS — O163 Unspecified maternal hypertension, third trimester: Secondary | ICD-10-CM | POA: Diagnosis not present

## 2016-06-10 DIAGNOSIS — B373 Candidiasis of vulva and vagina: Secondary | ICD-10-CM | POA: Insufficient documentation

## 2016-06-10 DIAGNOSIS — O4693 Antepartum hemorrhage, unspecified, third trimester: Secondary | ICD-10-CM | POA: Diagnosis not present

## 2016-06-10 DIAGNOSIS — Z7982 Long term (current) use of aspirin: Secondary | ICD-10-CM | POA: Diagnosis not present

## 2016-06-10 DIAGNOSIS — O98813 Other maternal infectious and parasitic diseases complicating pregnancy, third trimester: Secondary | ICD-10-CM | POA: Insufficient documentation

## 2016-06-10 DIAGNOSIS — O99013 Anemia complicating pregnancy, third trimester: Secondary | ICD-10-CM | POA: Insufficient documentation

## 2016-06-10 DIAGNOSIS — Z3A31 31 weeks gestation of pregnancy: Secondary | ICD-10-CM | POA: Diagnosis present

## 2016-06-10 DIAGNOSIS — B3731 Acute candidiasis of vulva and vagina: Secondary | ICD-10-CM

## 2016-06-10 LAB — URINALYSIS, ROUTINE W REFLEX MICROSCOPIC
BILIRUBIN URINE: NEGATIVE
Glucose, UA: NEGATIVE mg/dL
HGB URINE DIPSTICK: NEGATIVE
KETONES UR: NEGATIVE mg/dL
NITRITE: NEGATIVE
PH: 6 (ref 5.0–8.0)
Protein, ur: NEGATIVE mg/dL
Specific Gravity, Urine: 1.017 (ref 1.005–1.030)

## 2016-06-10 LAB — WET PREP, GENITAL
Clue Cells Wet Prep HPF POC: NONE SEEN
Sperm: NONE SEEN
Trich, Wet Prep: NONE SEEN
Yeast Wet Prep HPF POC: NONE SEEN

## 2016-06-10 MED ORDER — TERCONAZOLE 0.4 % VA CREA: 1.0000 | TOPICAL_CREAM | Freq: Every day | VAGINAL | 0 refills | Status: DC

## 2016-06-10 NOTE — Discharge Instructions (Signed)
Vaginal Bleeding During Pregnancy, Third Trimester A small amount of bleeding (spotting) from the vagina is relatively common in pregnancy. Various things can cause bleeding or spotting in pregnancy. Sometimes the bleeding is normal and is not a problem. However, bleeding during the third trimester can also be a sign of something serious for the mother and the baby. Be sure to tell your health care provider about any vaginal bleeding right away. Some possible causes of vaginal bleeding during the third trimester include:  The placenta may be partially or completely covering the opening to the cervix (placenta previa).  The placenta may have separated from the uterus (abruption of the placenta).  There may be an infection or growth on the cervix.  You may be starting labor, called discharging of the mucus plug.  The placenta may grow into the muscle layer of the uterus (placenta accreta). Follow these instructions at home: Watch your condition for any changes. The following actions may help to lessen any discomfort you are feeling:  Follow your health care provider's instructions for limiting your activity. If your health care provider orders bed rest, you may need to stay in bed and only get up to use the bathroom. However, your health care provider may allow you to continue light activity.  If needed, make plans for someone to help with your regular activities and responsibilities while you are on bed rest.  Keep track of the number of pads you use each day, how often you change pads, and how soaked (saturated) they are. Write this down.  Do not use tampons. Do not douche.  Do not have sexual intercourse or orgasms until approved by your health care provider.  Follow your health care provider's advice about lifting, driving, and physical activities.  If you pass any tissue from your vagina, save the tissue so you can show it to your health care provider.  Only take over-the-counter or  prescription medicines as directed by your health care provider.  Do not take aspirin because it can make you bleed.  Keep all follow-up appointments as directed by your health care provider. Contact a health care provider if:  You have any vaginal bleeding during any part of your pregnancy.  You have cramps or labor pains.  You have a fever, not controlled by medicine. Get help right away if:  You have severe cramps or pain in your back or belly (abdomen).  You have chills.  You have a gush of fluid from the vagina.  You pass large clots or tissue from your vagina.  Your bleeding increases.  You feel light-headed or weak.  You pass out.  You feel less movement or no movement of the baby. This information is not intended to replace advice given to you by your health care provider. Make sure you discuss any questions you have with your health care provider. Document Released: 04/14/2002 Document Revised: 06/30/2015 Document Reviewed: 09/29/2012 Elsevier Interactive Patient Education  2017 Elsevier Inc.   Vaginal Yeast infection, Adult Vaginal yeast infection is a condition that causes soreness, swelling, and redness (inflammation) of the vagina. It also causes vaginal discharge. This is a common condition. Some women get this infection frequently. What are the causes? This condition is caused by a change in the normal balance of the yeast (candida) and bacteria that live in the vagina. This change causes an overgrowth of yeast, which causes the inflammation. What increases the risk? This condition is more likely to develop in:  Women who take  antibiotic medicines.  Women who have diabetes.  Women who take birth control pills.  Women who are pregnant.  Women who douche often.  Women who have a weak defense (immune) system.  Women who have been taking steroid medicines for a long time.  Women who frequently wear tight clothing. What are the signs or  symptoms? Symptoms of this condition include:  White, thick vaginal discharge.  Swelling, itching, redness, and irritation of the vagina. The lips of the vagina (vulva) may be affected as well.  Pain or a burning feeling while urinating.  Pain during sex. How is this diagnosed? This condition is diagnosed with a medical history and physical exam. This will include a pelvic exam. Your health care provider will examine a sample of your vaginal discharge under a microscope. Your health care provider may send this sample for testing to confirm the diagnosis. How is this treated? This condition is treated with medicine. Medicines may be over-the-counter or prescription. You may be told to use one or more of the following:  Medicine that is taken orally.  Medicine that is applied as a cream.  Medicine that is inserted directly into the vagina (suppository). Follow these instructions at home:  Take or apply over-the-counter and prescription medicines only as told by your health care provider.  Do not have sex until your health care provider has approved. Tell your sex partner that you have a yeast infection. That person should go to his or her health care provider if he or she develops symptoms.  Do not wear tight clothes, such as pantyhose or tight pants.  Avoid using tampons until your health care provider approves.  Eat more yogurt. This may help to keep your yeast infection from returning.  Try taking a sitz bath to help with discomfort. This is a warm water bath that is taken while you are sitting down. The water should only come up to your hips and should cover your buttocks. Do this 3-4 times per day or as told by your health care provider.  Do not douche.  Wear breathable, cotton underwear.  If you have diabetes, keep your blood sugar levels under control. Contact a health care provider if:  You have a fever.  Your symptoms go away and then return.  Your symptoms do not  get better with treatment.  Your symptoms get worse.  You have new symptoms.  You develop blisters in or around your vagina.  You have blood coming from your vagina and it is not your menstrual period.  You develop pain in your abdomen. This information is not intended to replace advice given to you by your health care provider. Make sure you discuss any questions you have with your health care provider. Document Released: 11/01/2004 Document Revised: 07/06/2015 Document Reviewed: 07/26/2014 Elsevier Interactive Patient Education  2017 [employer].

## 2016-06-10 NOTE — MAU Provider Note (Signed)
Chief Complaint:  Vaginal Bleeding   First Provider Initiated Contact with Patient 06/10/16 0606     HPI: Amanda Wise is a 31 y.o. G2P1001 at [redacted]w[redacted]d who presents to maternity admissions reporting seeing a small amount of blood when she wipes severeal times since yesterday. The same thing happened 06/05/16. She was evaluated in MAU on no blood was seen on exam. Pt isn't sure where the blood is coming from, but denies straining w/ BM's or urinary complaints. Placenta is anterior, above os per 19 week anatomy scan. A POS.  Also reports decreased fetal mvmt.   Duration: <48 hours Context: pregnancy Associated signs and symptoms: Neg for LOF, abd pain, constipation, urinary complaints.   Last IC 3-4 months ago. Last pelvic exam 06/05/16.   Past Medical History:  Diagnosis Date  . Anemia   . Hypertension    OB History  Gravida Para Term Preterm AB Living  2 1 1     1   SAB TAB Ectopic Multiple Live Births        0 1    # Outcome Date GA Lbr Len/2nd Weight Sex Delivery Anes PTL Lv  2 Current           1 Term 03/06/15 [redacted]w[redacted]d 03:18 / 00:14 7 lb 4.2 oz (3.294 kg) F Vag-Spont EPI  LIV     Birth Comments: wnl     Past Surgical History:  Procedure Laterality Date  . NO PAST SURGERIES     Family History  Problem Relation Age of Onset  . Hypertension Other   . Hypertension Maternal Grandmother   . Hypertension Paternal Grandmother    Social History  Substance Use Topics  . Smoking status: Never Smoker  . Smokeless tobacco: Never Used  . Alcohol use No   No Known Allergies Prescriptions Prior to Admission  Medication Sig Dispense Refill Last Dose  . aspirin EC 81 MG tablet Take 81 mg by mouth daily.   06/09/2016 at Unknown time  . Prenatal Vit-Fe Fumarate-FA (PRENATAL MULTIVITAMIN) TABS tablet Take 1 tablet by mouth daily.    06/09/2016 at Unknown time    I have reviewed patient's Past Medical Hx, Surgical Hx, Family Hx, Social Hx, medications and allergies.   ROS:  Review of Systems   Constitutional: Negative for chills and fever.  Eyes: Negative for visual disturbance.  Gastrointestinal: Negative for abdominal pain, anal bleeding, blood in stool, constipation and rectal pain.  Genitourinary: Positive for vaginal bleeding (unsure). Negative for difficulty urinating, dysuria, flank pain, hematuria (unsure), pelvic pain, vaginal discharge and vaginal pain.  Neurological: Negative for headaches.    Physical Exam   Patient Vitals for the past 24 hrs:  BP Temp Temp src Pulse Resp SpO2 Height Weight  06/10/16 0551 - - - 90 - 98 % - -  06/10/16 0546 - - - 87 - 97 % - -  06/10/16 0542 (!) 115/58 - - 88 - - - -  06/10/16 0541 - - - 94 - 99 % - -  06/10/16 0536 - - - 90 - 98 % - -  06/10/16 0531 - - - 93 - 98 % - -  06/10/16 0526 - - - 85 - 98 % - -  06/10/16 0521 - - - 86 - 98 % - -  06/10/16 0519 124/72 - - 93 - - - -  06/10/16 0443 135/90 97.8 F (36.6 C) Oral 96 20 100 % 5\' 7"  (1.702 m) (!) 349 lb 12 oz (158.6 kg)  Constitutional: Well-developed, well-nourished, morbidly obese female in no acute distress.  Cardiovascular: normal rate Respiratory: normal effort GI: Abd soft, non-tender, gravid S>D MS: Extremities nontender, 1+ edema, normal ROM Neurologic: Alert and oriented x 4.  GU: Pelvic: NEFG, moderate amount of thick, white, odorless discharge, no blood, cervix clean, visually closed, non friable.   Dilation: Closed Effacement (%): Thick Cervical Position: Posterior Exam by:: Dorathy Kinsman CNM  FHT:  Baseline 140, moderate variability, 10x10 accelerations present, no decelerations Contractions: rare, painless   Labs: Results for orders placed or performed during the hospital encounter of 06/10/16 (from the past 24 hour(s))  Wet prep, genital     Status: Abnormal   Collection Time: 06/10/16  6:15 AM  Result Value Ref Range   Yeast Wet Prep HPF POC NONE SEEN NONE SEEN   Trich, Wet Prep NONE SEEN NONE SEEN   Clue Cells Wet Prep HPF POC NONE SEEN NONE  SEEN   WBC, Wet Prep HPF POC MODERATE (A) NONE SEEN   Sperm NONE SEEN   Urinalysis, Routine w reflex microscopic     Status: Abnormal   Collection Time: 06/10/16  6:19 AM  Result Value Ref Range   Color, Urine YELLOW YELLOW   APPearance HAZY (A) CLEAR   Specific Gravity, Urine 1.017 1.005 - 1.030   pH 6.0 5.0 - 8.0   Glucose, UA NEGATIVE NEGATIVE mg/dL   Hgb urine dipstick NEGATIVE NEGATIVE   Bilirubin Urine NEGATIVE NEGATIVE   Ketones, ur NEGATIVE NEGATIVE mg/dL   Protein, ur NEGATIVE NEGATIVE mg/dL   Nitrite NEGATIVE NEGATIVE   Leukocytes, UA TRACE (A) NEGATIVE   RBC / HPF 0-5 0 - 5 RBC/hpf   WBC, UA 0-5 0 - 5 WBC/hpf   Bacteria, UA RARE (A) NONE SEEN   Squamous Epithelial / LPF 6-30 (A) NONE SEEN   Mucous PRESENT    Hyaline Casts, UA PRESENT     Imaging:  No results found.  MAU Course: Orders Placed This Encounter  Procedures  . Wet prep, genital  . Urinalysis, Routine w reflex microscopic   Discussed Hx, labs, exam w/ Dr. Emelda Fear. Agrees w/ POC. New orders: None. With no blood on spec exam [place] not indicated.   MDM: - Bleeding on unknown etiology. No evidence of vaginal bleeding, PTL, hematuria, rectal bleeding. - Pt does have what looks like VVC. Question if is she may have scratched vulva and seen blood from that.   Assessment: 1. Vaginal bleeding in pregnancy, third trimester   2. Vaginal yeast infection    Plan: Discharge home in stable condition.  Preterm Labor precautions and fetal kick counts Abruption precautions.   Rx Terazol.  Follow-up Information    Department, [facility] Follow up.   Why:  as scheduled or sooner as needed if symptoms worsen.  Contact information: [address] [place] [address] [phone]        [facility] MATERNITY ADMISSIONS Follow up.   Why:  in pregnancy emergencies Contact information: [address] [id number] mc [place] [address] [phone]           Allergies as of 06/10/2016   No Known Allergies     Medication List    TAKE these medications   aspirin EC 81 MG tablet Take 81 mg by mouth daily.   prenatal multivitamin Tabs tablet Take 1 tablet by mouth daily.   terconazole 0.4 % vaginal cream Commonly known as:  TERAZOL 7 Place 1 applicator vaginally  at bedtime.       Katrinka Blazing, [place], [place] 06/10/2016 6:49 AM

## 2016-06-10 NOTE — MAU Note (Signed)
Vaginal bleeding bright red when wipe- started 30 min ago.  Last sex few months ago.  Felt baby move 3 hours ago but not since.  No leaking.

## 2016-06-11 LAB — GC/CHLAMYDIA PROBE AMP (~~LOC~~) NOT AT ARMC
Chlamydia: NEGATIVE
NEISSERIA GONORRHEA: NEGATIVE

## 2016-07-12 ENCOUNTER — Other Ambulatory Visit (HOSPITAL_COMMUNITY): Payer: Self-pay | Admitting: Nurse Practitioner

## 2016-07-13 ENCOUNTER — Other Ambulatory Visit (HOSPITAL_COMMUNITY): Payer: Self-pay | Admitting: Nurse Practitioner

## 2016-07-13 DIAGNOSIS — Z3689 Encounter for other specified antenatal screening: Secondary | ICD-10-CM

## 2016-07-13 DIAGNOSIS — Z3A37 37 weeks gestation of pregnancy: Secondary | ICD-10-CM

## 2016-07-17 LAB — OB RESULTS CONSOLE GBS: GBS: POSITIVE

## 2016-07-25 ENCOUNTER — Ambulatory Visit (HOSPITAL_COMMUNITY)
Admission: RE | Admit: 2016-07-25 | Discharge: 2016-07-25 | Disposition: A | Discharge status: Home / Self Care | Payer: Medicaid Other | Source: Ambulatory Visit | Attending: Nurse Practitioner | Admitting: Nurse Practitioner

## 2016-07-25 ENCOUNTER — Other Ambulatory Visit (HOSPITAL_COMMUNITY): Payer: Self-pay | Admitting: Nurse Practitioner

## 2016-07-25 DIAGNOSIS — Z3689 Encounter for other specified antenatal screening: Secondary | ICD-10-CM

## 2016-07-25 DIAGNOSIS — Z6841 Body Mass Index (BMI) 40.0 and over, adult: Secondary | ICD-10-CM | POA: Diagnosis not present

## 2016-07-25 DIAGNOSIS — Z3A37 37 weeks gestation of pregnancy: Secondary | ICD-10-CM

## 2016-07-25 DIAGNOSIS — O99213 Obesity complicating pregnancy, third trimester: Secondary | ICD-10-CM | POA: Insufficient documentation

## 2016-07-25 DIAGNOSIS — O26843 Uterine size-date discrepancy, third trimester: Secondary | ICD-10-CM | POA: Insufficient documentation

## 2016-07-25 DIAGNOSIS — O09293 Supervision of pregnancy with other poor reproductive or obstetric history, third trimester: Secondary | ICD-10-CM | POA: Insufficient documentation

## 2016-07-25 DIAGNOSIS — Z369 Encounter for antenatal screening, unspecified: Secondary | ICD-10-CM | POA: Diagnosis present

## 2016-08-02 ENCOUNTER — Encounter (HOSPITAL_COMMUNITY): Payer: Self-pay | Admitting: *Deleted

## 2016-08-02 ENCOUNTER — Inpatient Hospital Stay (HOSPITAL_COMMUNITY)
Admission: AD | Admit: 2016-08-02 | Discharge: 2016-08-02 | Disposition: A | Discharge status: Home / Self Care | Payer: Medicaid Other | Source: Ambulatory Visit | Attending: Obstetrics & Gynecology | Admitting: Obstetrics & Gynecology

## 2016-08-02 DIAGNOSIS — Z3A39 39 weeks gestation of pregnancy: Secondary | ICD-10-CM | POA: Diagnosis not present

## 2016-08-02 DIAGNOSIS — Z7982 Long term (current) use of aspirin: Secondary | ICD-10-CM | POA: Insufficient documentation

## 2016-08-02 DIAGNOSIS — N898 Other specified noninflammatory disorders of vagina: Secondary | ICD-10-CM | POA: Diagnosis present

## 2016-08-02 DIAGNOSIS — O26893 Other specified pregnancy related conditions, third trimester: Secondary | ICD-10-CM

## 2016-08-02 DIAGNOSIS — O163 Unspecified maternal hypertension, third trimester: Secondary | ICD-10-CM | POA: Diagnosis not present

## 2016-08-02 HISTORY — DX: Palpitations: R00.2

## 2016-08-02 HISTORY — DX: Gestational (pregnancy-induced) hypertension without significant proteinuria, unspecified trimester: O13.9

## 2016-08-02 LAB — POCT FERN TEST: POCT FERN TEST: NEGATIVE

## 2016-08-02 NOTE — MAU Provider Note (Signed)
History   [id number]   Chief Complaint  Patient presents with  . Rupture of Membranes    HPI Fontaine Kossman is a 31 y.o. female  G2P1001 here with report of LOF.  Leaking of fluid has continued.  Pt reports contractions none. She denies vaginal bleeding. She reports + fetal movement. All other systems negative.    Patient's last menstrual period was 11/05/2015.  OB History  Gravida Para Term Preterm AB Living  2 1 1     1   SAB TAB Ectopic Multiple Live Births        0 1    # Outcome Date GA Lbr Len/2nd Weight Sex Delivery Anes PTL Lv  2 Current           1 Term 03/06/15 [redacted]w[redacted]d 03:18 / 00:14 3.294 kg (7 lb 4.2 oz) F Vag-Spont EPI  LIV     Birth Comments: wnl      Past Medical History:  Diagnosis Date  . Anemia   . Heart palpitations   . Hypertension   . Pregnancy induced hypertension     Family History  Problem Relation Age of Onset  . Hypertension Other   . Hypertension Maternal Grandmother   . Hypertension Paternal Grandmother     Social History   Social History  . Marital status: Single    Spouse name: N/A  . Number of children: N/A  . Years of education: N/A   Social History Main Topics  . Smoking status: Never Smoker  . Smokeless tobacco: Never Used  . Alcohol use No  . Drug use: No  . Sexual activity: Yes    Birth control/ protection: None   Other Topics Concern  . None   Social History Narrative  . None    No Known Allergies  No current facility-administered medications on file prior to encounter.    Current Outpatient Prescriptions on File Prior to Encounter  Medication Sig Dispense Refill  . aspirin EC 81 MG tablet Take 81 mg by mouth daily.    . Prenatal Vit-Fe Fumarate-FA (PRENATAL MULTIVITAMIN) TABS tablet Take 1 tablet by mouth daily.     Marland Kitchen terconazole (TERAZOL 7) 0.4 % vaginal cream Place 1 applicator vaginally at bedtime. (Patient not taking: Reported on 08/02/2016) 45 g 0     Review of Systems  Constitutional: Negative for  chills and fever.  Gastrointestinal: Negative for abdominal pain.     Physical Exam   Vitals:   08/02/16 1002  BP: 119/74  Pulse: (!) 124  Resp: 18  Temp: 97.7 F (36.5 C)  TempSrc: Oral    Physical Exam  Vitals reviewed. Constitutional: She appears well-developed and well-nourished.  Cardiovascular: Normal rate.   Respiratory: Effort normal. No respiratory distress.  GI: Soft. She exhibits no distension. There is no tenderness. There is no rebound and no guarding.  Genitourinary:  Genitourinary Comments: Small amount of thin white vaginal discharge no pooling, ferning slide negative    MAU Course  Procedures  MDM In MA U patient underwent evaluation for rupture of membranes. The nurse first of the fern slide which was negative. I was called that her room and did a speculum exam which was not consistent with rupture of membranes and a second fern was collected. It also was negative.   Continuous fetal monitoring was performed with a baseline of 150 moderate variability and reactivity. Patient is having some mild contractions. Assessment and Plan  #1: Vaginal discharge likely physiologic no rupture membranes at this  time. Okay to DC home.  Ernestina Penna, MD 11:01 AM 08/02/16

## 2016-08-02 NOTE — Discharge Instructions (Signed)

## 2016-08-02 NOTE — MAU Note (Signed)
Pt had leaking of fluid around 0830, her pants were wet, went to BR & continued to leak.  Clear fluid, blood tinged.  Denies contractions.  No frank bleeding.

## 2016-08-07 ENCOUNTER — Inpatient Hospital Stay (HOSPITAL_COMMUNITY): Payer: Medicaid Other | Admitting: Anesthesiology

## 2016-08-07 ENCOUNTER — Encounter (HOSPITAL_COMMUNITY): Payer: Self-pay

## 2016-08-07 ENCOUNTER — Inpatient Hospital Stay (HOSPITAL_COMMUNITY)
Admission: AD | Admit: 2016-08-07 | Discharge: 2016-08-09 | DRG: 775 | Disposition: A | Discharge status: Home / Self Care | Payer: Medicaid Other | Source: Ambulatory Visit | Attending: Family Medicine | Admitting: Family Medicine

## 2016-08-07 DIAGNOSIS — Z3A39 39 weeks gestation of pregnancy: Secondary | ICD-10-CM

## 2016-08-07 DIAGNOSIS — O3663X0 Maternal care for excessive fetal growth, third trimester, not applicable or unspecified: Principal | ICD-10-CM | POA: Diagnosis present

## 2016-08-07 DIAGNOSIS — O99824 Streptococcus B carrier state complicating childbirth: Secondary | ICD-10-CM | POA: Diagnosis present

## 2016-08-07 DIAGNOSIS — Z6841 Body Mass Index (BMI) 40.0 and over, adult: Secondary | ICD-10-CM

## 2016-08-07 DIAGNOSIS — O99214 Obesity complicating childbirth: Secondary | ICD-10-CM | POA: Diagnosis present

## 2016-08-07 DIAGNOSIS — R200 Anesthesia of skin: Secondary | ICD-10-CM | POA: Diagnosis not present

## 2016-08-07 DIAGNOSIS — Z3493 Encounter for supervision of normal pregnancy, unspecified, third trimester: Secondary | ICD-10-CM | POA: Diagnosis present

## 2016-08-07 LAB — CBC
HCT: 34.7 % — ABNORMAL LOW (ref 36.0–46.0)
Hemoglobin: 11.8 g/dL — ABNORMAL LOW (ref 12.0–15.0)
MCH: 30.6 pg (ref 26.0–34.0)
MCHC: 34 g/dL (ref 30.0–36.0)
MCV: 89.9 fL (ref 78.0–100.0)
PLATELETS: 189 10*3/uL (ref 150–400)
RBC: 3.86 MIL/uL — ABNORMAL LOW (ref 3.87–5.11)
RDW: 14.9 % (ref 11.5–15.5)
WBC: 10.9 10*3/uL — ABNORMAL HIGH (ref 4.0–10.5)

## 2016-08-07 LAB — TYPE AND SCREEN
ABO/RH(D): A POS
Antibody Screen: NEGATIVE

## 2016-08-07 MED ORDER — ACETAMINOPHEN 325 MG PO TABS: 650.0000 mg | ORAL_TABLET | ORAL | Status: DC | PRN

## 2016-08-07 MED ORDER — DIBUCAINE 1 % RE OINT: 1.0000 "application " | TOPICAL_OINTMENT | RECTAL | Status: DC | PRN

## 2016-08-07 MED ORDER — OXYCODONE-ACETAMINOPHEN 5-325 MG PO TABS: 1.0000 | ORAL_TABLET | ORAL | Status: DC | PRN

## 2016-08-07 MED ORDER — SODIUM CHLORIDE 0.9 % IV SOLN: 250.0000 mL | INTRAVENOUS | Status: DC | PRN

## 2016-08-07 MED ORDER — METHYLERGONOVINE MALEATE 0.2 MG PO TABS
0.2000 mg | ORAL_TABLET | ORAL | Status: DC | PRN
  Administered 2016-08-08 (×3): 0.2 mg via ORAL
  Filled 2016-08-07 (×3): qty 1

## 2016-08-07 MED ORDER — CEFOTETAN DISODIUM-DEXTROSE 2-2.08 GM-% IV SOLR
2.0000 g | Freq: Two times a day (BID) | INTRAVENOUS | Status: AC
  Administered 2016-08-07: 2 g via INTRAVENOUS
  Filled 2016-08-07: qty 50

## 2016-08-07 MED ORDER — ONDANSETRON HCL 4 MG/2ML IJ SOLN: 4.0000 mg | Freq: Four times a day (QID) | INTRAMUSCULAR | Status: DC | PRN

## 2016-08-07 MED ORDER — SIMETHICONE 80 MG PO CHEW: 80.0000 mg | CHEWABLE_TABLET | ORAL | Status: DC | PRN

## 2016-08-07 MED ORDER — FENTANYL CITRATE (PF) 100 MCG/2ML IJ SOLN: 100.0000 ug | INTRAMUSCULAR | Status: DC | PRN

## 2016-08-07 MED ORDER — TETANUS-DIPHTH-ACELL PERTUSSIS 5-2.5-18.5 LF-MCG/0.5 IM SUSP: 0.5000 mL | Freq: Once | INTRAMUSCULAR | Status: DC

## 2016-08-07 MED ORDER — PRENATAL MULTIVITAMIN CH: 1.0000 | ORAL_TABLET | Freq: Every day | ORAL | Status: DC

## 2016-08-07 MED ORDER — PHENYLEPHRINE 40 MCG/ML (10ML) SYRINGE FOR IV PUSH (FOR BLOOD PRESSURE SUPPORT): 80.0000 ug | PREFILLED_SYRINGE | INTRAVENOUS | Status: DC | PRN

## 2016-08-07 MED ORDER — OXYTOCIN 40 UNITS IN LACTATED RINGERS INFUSION - SIMPLE MED
2.5000 [IU]/h | INTRAVENOUS | Status: DC
  Administered 2016-08-07: 2.5 [IU]/h via INTRAVENOUS
  Filled 2016-08-07: qty 1000

## 2016-08-07 MED ORDER — ONDANSETRON HCL 4 MG/2ML IJ SOLN: 4.0000 mg | INTRAMUSCULAR | Status: DC | PRN

## 2016-08-07 MED ORDER — OXYTOCIN BOLUS FROM INFUSION: 500.0000 mL | Freq: Once | INTRAVENOUS | Status: DC

## 2016-08-07 MED ORDER — FENTANYL 2.5 MCG/ML BUPIVACAINE 1/10 % EPIDURAL INFUSION (WH - ANES)
14.0000 mL/h | INTRAMUSCULAR | Status: DC | PRN
  Administered 2016-08-07 (×2): 14 mL/h via EPIDURAL
  Filled 2016-08-07: qty 100

## 2016-08-07 MED ORDER — ONDANSETRON HCL 4 MG PO TABS: 4.0000 mg | ORAL_TABLET | ORAL | Status: DC | PRN

## 2016-08-07 MED ORDER — COCONUT OIL OIL: 1.0000 "application " | TOPICAL_OIL | Status: DC | PRN

## 2016-08-07 MED ORDER — PRENATAL MULTIVITAMIN CH
1.0000 | ORAL_TABLET | Freq: Every day | ORAL | Status: DC
  Administered 2016-08-08 – 2016-08-09 (×2): 1 via ORAL
  Filled 2016-08-07 (×2): qty 1

## 2016-08-07 MED ORDER — LIDOCAINE HCL (PF) 1 % IJ SOLN
INTRAMUSCULAR | Status: DC | PRN
  Administered 2016-08-07: 6 mL via EPIDURAL
  Administered 2016-08-07: 4 mL

## 2016-08-07 MED ORDER — SENNOSIDES-DOCUSATE SODIUM 8.6-50 MG PO TABS
2.0000 | ORAL_TABLET | ORAL | Status: DC
  Administered 2016-08-08 (×2): 2 via ORAL
  Filled 2016-08-07 (×2): qty 2

## 2016-08-07 MED ORDER — ACETAMINOPHEN 325 MG PO TABS
650.0000 mg | ORAL_TABLET | ORAL | Status: DC | PRN
  Administered 2016-08-07: 650 mg via ORAL
  Filled 2016-08-07: qty 2

## 2016-08-07 MED ORDER — ZOLPIDEM TARTRATE 5 MG PO TABS: 5.0000 mg | ORAL_TABLET | Freq: Every evening | ORAL | Status: DC | PRN

## 2016-08-07 MED ORDER — LACTATED RINGERS IV SOLN
INTRAVENOUS | Status: DC
  Administered 2016-08-07 (×3): via INTRAVENOUS

## 2016-08-07 MED ORDER — BENZOCAINE-MENTHOL 20-0.5 % EX AERO: 1.0000 "application " | INHALATION_SPRAY | CUTANEOUS | Status: DC | PRN

## 2016-08-07 MED ORDER — OXYCODONE HCL 5 MG PO TABS
5.0000 mg | ORAL_TABLET | ORAL | Status: DC | PRN
Start: 2016-08-07 — End: 2016-08-09

## 2016-08-07 MED ORDER — OXYCODONE-ACETAMINOPHEN 5-325 MG PO TABS: 2.0000 | ORAL_TABLET | ORAL | Status: DC | PRN

## 2016-08-07 MED ORDER — METHYLERGONOVINE MALEATE 0.2 MG/ML IJ SOLN
0.2000 mg | Freq: Once | INTRAMUSCULAR | Status: AC
  Administered 2016-08-07: 0.2 mg via INTRAMUSCULAR

## 2016-08-07 MED ORDER — SENNOSIDES-DOCUSATE SODIUM 8.6-50 MG PO TABS: 2.0000 | ORAL_TABLET | ORAL | Status: DC

## 2016-08-07 MED ORDER — WITCH HAZEL-GLYCERIN EX PADS: 1.0000 "application " | MEDICATED_PAD | CUTANEOUS | Status: DC | PRN

## 2016-08-07 MED ORDER — PENICILLIN G POT IN DEXTROSE 60000 UNIT/ML IV SOLN
3.0000 10*6.[IU] | INTRAVENOUS | Status: DC
  Administered 2016-08-07 (×3): 3 10*6.[IU] via INTRAVENOUS
  Filled 2016-08-07 (×5): qty 50

## 2016-08-07 MED ORDER — LACTATED RINGERS IV SOLN
500.0000 mL | Freq: Once | INTRAVENOUS | Status: AC
  Administered 2016-08-07: 500 mL via INTRAVENOUS

## 2016-08-07 MED ORDER — LIDOCAINE HCL (PF) 1 % IJ SOLN
30.0000 mL | INTRAMUSCULAR | Status: DC | PRN
  Filled 2016-08-07: qty 30

## 2016-08-07 MED ORDER — SODIUM CHLORIDE 0.9% FLUSH
3.0000 mL | Freq: Two times a day (BID) | INTRAVENOUS | Status: DC
  Administered 2016-08-08: 3 mL via INTRAVENOUS

## 2016-08-07 MED ORDER — WITCH HAZEL-GLYCERIN EX PADS
1.0000 "application " | MEDICATED_PAD | CUTANEOUS | Status: DC | PRN
  Administered 2016-08-08: 1 via TOPICAL

## 2016-08-07 MED ORDER — BENZOCAINE-MENTHOL 20-0.5 % EX AERO
1.0000 "application " | INHALATION_SPRAY | CUTANEOUS | Status: DC | PRN
  Filled 2016-08-07: qty 56

## 2016-08-07 MED ORDER — TERBUTALINE SULFATE 1 MG/ML IJ SOLN: 0.2500 mg | Freq: Once | INTRAMUSCULAR | Status: DC | PRN

## 2016-08-07 MED ORDER — OXYTOCIN 40 UNITS IN LACTATED RINGERS INFUSION - SIMPLE MED
1.0000 m[IU]/min | INTRAVENOUS | Status: DC
  Administered 2016-08-07: 2 m[IU]/min via INTRAVENOUS

## 2016-08-07 MED ORDER — LACTATED RINGERS IV SOLN: 500.0000 mL | INTRAVENOUS | Status: DC | PRN

## 2016-08-07 MED ORDER — DIPHENHYDRAMINE HCL 25 MG PO CAPS: 25.0000 mg | ORAL_CAPSULE | Freq: Four times a day (QID) | ORAL | Status: DC | PRN

## 2016-08-07 MED ORDER — OXYTOCIN 40 UNITS IN LACTATED RINGERS INFUSION - SIMPLE MED
2.5000 [IU]/h | INTRAVENOUS | Status: DC | PRN
  Administered 2016-08-07: 2.5 [IU]/h via INTRAVENOUS

## 2016-08-07 MED ORDER — IBUPROFEN 600 MG PO TABS: 600.0000 mg | ORAL_TABLET | Freq: Four times a day (QID) | ORAL | Status: DC

## 2016-08-07 MED ORDER — IBUPROFEN 600 MG PO TABS
600.0000 mg | ORAL_TABLET | Freq: Four times a day (QID) | ORAL | Status: DC
  Administered 2016-08-07 – 2016-08-09 (×7): 600 mg via ORAL
  Filled 2016-08-07 (×7): qty 1

## 2016-08-07 MED ORDER — PENICILLIN G POTASSIUM 5000000 UNITS IJ SOLR
5.0000 10*6.[IU] | Freq: Once | INTRAVENOUS | Status: AC
  Administered 2016-08-07: 5 10*6.[IU] via INTRAVENOUS
  Filled 2016-08-07: qty 5

## 2016-08-07 MED ORDER — EPHEDRINE 5 MG/ML INJ: 10.0000 mg | INTRAVENOUS | Status: DC | PRN

## 2016-08-07 MED ORDER — SODIUM CHLORIDE 0.9% FLUSH
3.0000 mL | INTRAVENOUS | Status: DC | PRN
  Administered 2016-08-08: 3 mL via INTRAVENOUS
  Filled 2016-08-07: qty 3

## 2016-08-07 MED ORDER — SOD CITRATE-CITRIC ACID 500-334 MG/5ML PO SOLN: 30.0000 mL | ORAL | Status: DC | PRN

## 2016-08-07 MED ORDER — DIPHENHYDRAMINE HCL 50 MG/ML IJ SOLN: 12.5000 mg | INTRAMUSCULAR | Status: DC | PRN

## 2016-08-07 MED ORDER — PHENYLEPHRINE 40 MCG/ML (10ML) SYRINGE FOR IV PUSH (FOR BLOOD PRESSURE SUPPORT)
PREFILLED_SYRINGE | INTRAVENOUS | Status: DC
Start: 2016-08-07 — End: 2016-08-07
  Filled 2016-08-07: qty 20

## 2016-08-07 MED ORDER — FENTANYL 2.5 MCG/ML BUPIVACAINE 1/10 % EPIDURAL INFUSION (WH - ANES)
INTRAMUSCULAR | Status: AC
Start: 2016-08-07 — End: 2016-08-07
  Filled 2016-08-07: qty 100

## 2016-08-07 NOTE — Anesthesia Procedure Notes (Signed)
Epidural Patient location during procedure: OB  Staffing Anesthesiologist: Cristela Blue  Preanesthetic Checklist Completed: patient identified, site marked, surgical consent, pre-op evaluation, timeout performed, IV checked, risks and benefits discussed and monitors and equipment checked  Epidural Patient position: sitting Prep: site prepped and draped and DuraPrep Patient monitoring: continuous pulse ox and blood pressure Approach: midline Location: L3-L4 Injection technique: LOR air  Needle:  Needle type: Tuohy  Needle gauge: 17 G Needle length: 9 cm and 9 Needle insertion depth: 7 cm Catheter type: closed end flexible Catheter size: 19 Gauge Catheter at skin depth: 15 cm Test dose: negative  Assessment Events: blood not aspirated, injection not painful, no injection resistance, negative IV test and no paresthesia  Additional Notes Dosing of Epidural:  1st dose, through catheter ............................................Marland Kitchen  Xylocaine 40 mg  2nd dose, through catheter, after waiting 3 minutes........Marland KitchenXylocaine 60 mg    As each dose occurred, patient was free of IV sx; and patient exhibited no evidence of SA injection.  Patient is more comfortable after epidural dosed. Please see RN's note for documentation of vital signs,and FHR which are stable.  Patient reminded not to try to ambulate with numb legs, and that an RN must be present when she attempts to get up.

## 2016-08-07 NOTE — Progress Notes (Signed)
Patient ID: Amanda Wise, female   DOB: 02-22-1985, 31 y.o.   MRN: [id number] [name] is a 31 y.o. G2P1001 at [redacted]w[redacted]d. Labor Progress Note  S: Pt asleep when entering room, no discomfort.  O:  BP (!) 141/78    Pulse 91    Temp 98.4 F (36.9 C) (Oral)    Resp 20    Ht 5\' 7"  (1.702 m)    Wt (!) 156.9 kg (346 lb)    LMP 11/05/2015    SpO2 96%    BMI 54.19 kg/m  Cat 1 Dilation: 6.5 Effacement (%): 90 Cervical Position: Middle Station: -2, -1 Presentation: Vertex Exam by:: Devra Dopp, RN   A&P: 31 y.o. G2P1001 [redacted]w[redacted]d SOL, boy, nexplanon   [place] J Shirley, DO 10:39 AM

## 2016-08-07 NOTE — Anesthesia Preprocedure Evaluation (Signed)
Anesthesia Evaluation  Patient identified by MRN, date of birth, ID band Patient awake    Reviewed: Allergy & Precautions, H&P , Patient's Chart, lab work & pertinent test results  Airway Mallampati: II  TM Distance: >3 FB Neck ROM: full    Dental  (+) Teeth Intact   Pulmonary    breath sounds clear to auscultation       Cardiovascular hypertension,  Rhythm:regular Rate:Normal     Neuro/Psych    GI/Hepatic   Endo/Other  Morbid obesity  Renal/GU      Musculoskeletal   Abdominal   Peds  Hematology   Anesthesia Other Findings       Reproductive/Obstetrics (+) Pregnancy                             Anesthesia Physical Anesthesia Plan  ASA: III  Anesthesia Plan: Epidural   Post-op Pain Management:    Induction:   PONV Risk Score and Plan:   Airway Management Planned:   Additional Equipment:   Intra-op Plan:   Post-operative Plan:   Informed Consent: I have reviewed the patients History and Physical, chart, labs and discussed the procedure including the risks, benefits and alternatives for the proposed anesthesia with the patient or authorized representative who has indicated his/her understanding and acceptance.   Dental Advisory Given  Plan Discussed with:   Anesthesia Plan Comments: (Labs checked- platelets confirmed with RN in room. Fetal heart tracing, per RN, reported to be stable enough for sitting procedure. Discussed epidural, and patient consents to the procedure:  included risk of possible headache,backache, failed block, allergic reaction, and nerve injury. This patient was asked if she had any questions or concerns before the procedure started.)        Anesthesia Quick Evaluation

## 2016-08-07 NOTE — Progress Notes (Signed)
Patient ID: Amanda Wise, female   DOB: 04/22/1985, 31 y.o.   MRN: [id number] Labor Progress Note  S: Pt is complete, and we are going to let her labor down. Replaced the FSC.  O:  BP 122/60    Pulse 98    Temp 98.6 F (37 C) (Oral)    Resp 17    Ht 5\' 7"  (1.702 m)    Wt (!) 156.9 kg (346 lb)    LMP 11/05/2015    SpO2 96%    BMI 54.19 kg/m  FHT: 135 bpm, mod var, +accels, no decels- replaced FSC TOCO:  patient looks comfortable during contractions  Dilation: 10 Effacement (%): 90 Cervical Position: Middle Station: -2, -1 Presentation: Vertex Exam by:: Devra Dopp, RN    A&P: 31 y.o. G2P1001 [redacted]w[redacted]d   Holding pit SROM sometime after arrival around 0230 Continue expectant management Anticipate SVD  [place] J Shirley 12:57 PM

## 2016-08-07 NOTE — Anesthesia Pain Management Evaluation Note (Signed)
  [facility] Pain Management Visit Note  Patient: Amanda Wise, 31 y.o., female  "Hello I am a member of the anesthesia team at [facility]. We have an anesthesia team available at all times to provide care throughout the hospital, including epidural management and anesthesia for C-section. I don't know your plan for the delivery whether it a natural birth, water birth, IV sedation, nitrous supplementation, doula or epidural, but we want to meet your pain goals."   1.Was your pain managed to your expectations on prior hospitalizations?   No, my epidural really didn't work last time.   2.What is your expectation for pain management during this hospitalization?     Epidural  3.How can we help you reach that goal? 4  Record the patient's initial score and the patient's pain goal.   Pain: 9, prior to epidural - now it is 0.  Pain Goal: 4 [facility] wants you to be able to say your pain was always managed very well.  Tamea Bai 08/07/2016

## 2016-08-07 NOTE — H&P (Signed)
Amanda Wise is a 31 y.o. female G2P1001 @ 39.3wks by LMP and confirmed by 12wk scan presenting for reg ctx. Denies leaking or bldg. Denies H/A, RUQ pain or visual disturbances. Her preg has been followed by the [facility] service and has been essentially remarkable other than 1) report of gHTN w/ prev preg 2) GBS pos 3) macrosomic infant w/ EFW 8+14 x 2 wks ago 4) morbid obesity (BMI 55)  OB History    Gravida Para Term Preterm AB Living   2 1 1     1    SAB TAB Ectopic Multiple Live Births         0 1     Past Medical History:  Diagnosis Date  . Anemia   . Heart palpitations   . Hypertension   . Pregnancy induced hypertension    Past Surgical History:  Procedure Laterality Date  . NO PAST SURGERIES     Family History: family history includes Hypertension in her maternal grandmother, other, and paternal grandmother. Social History:  reports that she has never smoked. She has never used smokeless tobacco. She reports that she does not drink alcohol or use drugs.     Maternal Diabetes: No Genetic Screening: Normal Maternal Ultrasounds/Referrals: Normal Fetal Ultrasounds or other Referrals:  None Maternal Substance Abuse:  No Significant Maternal Medications:  None Significant Maternal Lab Results:  Lab values include: Group B Strep positive Other Comments:  EFW 8+14 x 2wks ago  ROS History Dilation: 5 Effacement (%): 90 Station: -2 Exam by:: [employer] RN Blood pressure 134/82, pulse (!) 106, temperature 98.4 F (36.9 C), temperature source Oral, resp. rate 20, last menstrual period 11/05/2015, unknown if currently breastfeeding. Exam Physical Exam  Constitutional: She is oriented to person, place, and time.  Morbid obesity  HENT:  Head: Normocephalic.  Eyes: Pupils are equal, round, and reactive to light.  Cardiovascular:  Sl tachycardic  Respiratory: Effort normal.  GI:  EFM 130s, +accels, no decels Ctx q 2-3 mins, spont  Musculoskeletal: Normal range of motion.   Neurological: She is alert and oriented to person, place, and time.  Skin: Skin is warm and dry.  Psychiatric: She has a normal mood and affect. Her behavior is normal. Thought content normal.    Prenatal labs: ABO, Rh:  A+ Antibody:  neg Rubella:  immune RPR:   NR HBsAg:   neg HIV:   NR GBS:   pos 07/17/16  Assessment/Plan: IUP@39 .3wks Active labor GBS pos LGA  Admit to [facility] Expectant management  PCN for GBS ppx Anticipate SVD, but be prepared for shoulder dystocia   Sindia Kowalczyk CNM 08/07/2016, 4:05 AM

## 2016-08-07 NOTE — MAU Note (Signed)
Contractions since 1 am. No leaking. Blood tinge mucus today. Baby moving well.

## 2016-08-07 NOTE — Progress Notes (Signed)
Patient ID: Amanda Wise, female   DOB: 12-12-1985, 31 y.o.   MRN: [id number]  S: Patient seen & examined for progress of labor. Patient comfortable with epidural.    O:  Vitals:   08/07/16 1101 08/07/16 1135 08/07/16 1201 08/07/16 1216  BP: (!) 145/89 (!) 109/47 (!) 100/58 (!) 109/55  Pulse: 97 88 91 95  Resp: 18 18  18   Temp:      TempSrc:      SpO2:      Weight:      Height:        Dilation: 6.5 Effacement (%): 90 Cervical Position: Middle Station: -2, -1 Presentation: Vertex Exam by:: Devra Dopp, RN    FHT: 135 bpm, mod var, +accels, no decels TOCO: q2-41min   A/P: Starting pitocin SROM sometime shortly after arrival? Continue expectant management Anticipate SVD

## 2016-08-08 LAB — CBC
HCT: 33.7 % — ABNORMAL LOW (ref 36.0–46.0)
HEMOGLOBIN: 11.7 g/dL — AB (ref 12.0–15.0)
MCH: 30.9 pg (ref 26.0–34.0)
MCHC: 34.7 g/dL (ref 30.0–36.0)
MCV: 88.9 fL (ref 78.0–100.0)
Platelets: 187 10*3/uL (ref 150–400)
RBC: 3.79 MIL/uL — ABNORMAL LOW (ref 3.87–5.11)
RDW: 14.9 % (ref 11.5–15.5)
WBC: 12.6 10*3/uL — ABNORMAL HIGH (ref 4.0–10.5)

## 2016-08-08 LAB — RPR: RPR Ser Ql: NONREACTIVE

## 2016-08-08 MED ORDER — IBUPROFEN 600 MG PO TABS: 600.0000 mg | ORAL_TABLET | Freq: Four times a day (QID) | ORAL | 0 refills | Status: DC

## 2016-08-08 NOTE — Progress Notes (Addendum)
Visit from CRNA A. Deretha Ertle around 0800 this morning. Patient complaining of right foot numbness with right knee buckling ocassionally when she tries to stand. I informed the patient that I would visit later on today.  Visit from CRNA A. Casaundra Takacs around 1230 this day. Patient still complaining of right foot numbness, feeling like pins and needles. She says that she is progressively walking better. She says that during labor/delivery, a bar and towel were used during pushing. I informed her that this position during labor/delivery may have caused her right foot decreased sensation. She is having decreased dorsiflexion on right foot, compared to left foot. She did claim that it took a while to get feeling back in her right thigh, but that it is back to normal sensation at 1230 visit. I told patient that I would inform anesthesiologist and OB/GYN. MDA Dr. Hyacinth Meeker informed of this issue and we both agree that this is not related to epidural. OB/GYN with faculty practice called to make aware of issue, and to pass off care to [facility] provider. Mother/baby RN also made aware of this issue.  Iantha Fallen CRNA

## 2016-08-08 NOTE — Lactation Note (Signed)
This note was copied from a baby's chart. Lactation Consultation Note Mom's 2nd baby. Didn't BF her 1st child, would like to try to BF this baby. Baby has NO interest in BF. Will not suck on gloved finger. Mom sleeping w/baby STS. Awaken mom. Offered to put baby to breast. Mom stated yes. Mom concerned baby hasn't BF at 45 hrs old. Educated on newborn feeding habits and behavior. Discussed signs to watch for, jittery, difficulty to awaken, no out put. Discussed baby should start getting more hungry closer to 12-24 hrs old.  Mom has "V" shaped breast w/everted w/concaved center nipples turning inwards towards mom's abd. Breast has to be lifted upwards for latching. Hand expression w/no colostrum noted. Mom stated her breast increased during pregnancy.  Discussed safety not sleeping w/baby. STS good to do. Mom stated she didn't mean to fall asleep, as to put baby in crib after baby wasn't interested in BF. Mom very sleepy.  Mom encouraged to feed baby 8-12 times/24 hours and with feeding cues. Mom encouraged to waken baby for feeds. Educated on STS, I&O, cluster feeding, supply and demand.  [facility] brochure given w/resources, support groups and LC services. Mom has WIC.  Patient Name: Boy Caiya Million NHAFB'X Date: 08/08/2016 Reason for consult: Initial assessment   Maternal Data Has patient been taught Hand Expression?: Yes Does the patient have breastfeeding experience prior to this delivery?: No  Feeding Feeding Type: Breast Fed Length of feed: 0 min  LATCH Score/Interventions Latch: Too sleepy or reluctant, no latch achieved, no sucking elicited. Intervention(s): Skin to skin Intervention(s): Adjust position;Assist with latch;Breast massage;Breast compression  Audible Swallowing: None Intervention(s): Skin to skin;Hand expression  Type of Nipple: Everted at rest and after stimulation  Comfort (Breast/Nipple): Soft / non-tender     Hold (Positioning): Full assist, staff holds infant  at breast Intervention(s): Breastfeeding basics reviewed;Support Pillows;Position options;Skin to skin  LATCH Score: 4  Lactation Tools Discussed/Used WIC Program: Yes   Consult Status Consult Status: Follow-up Date: 08/08/16 Follow-up type: In-patient    Charyl Dancer 08/08/2016, 4:39 AM

## 2016-08-08 NOTE — Progress Notes (Signed)
Post Partum Day #1 Subjective: no complaints, up ad lib, voiding and tolerating PO  Objective: Blood pressure 119/72, pulse 87, temperature 98.6 F (37 C), temperature source Oral, resp. rate 20, height 5\' 7"  (1.702 m), weight (!) 156.9 kg (346 lb), last menstrual period 11/05/2015, SpO2 96 %, unknown if currently breastfeeding.  Physical Exam:  General: alert Lochia: appropriate Uterine Fundus: firm DVT Evaluation: No evidence of DVT seen on physical exam.   Recent Labs  08/07/16 0502 08/08/16 0529  HGB 11.8* 11.7*  HCT 34.7* 33.7*    Assessment/Plan: Plan for discharge tomorrow  Breast and bottle Plans Nexplanon   LOS: 1 day   Tiffany Calmes C Marquail Bradwell 08/08/2016, 7:30 AM

## 2016-08-08 NOTE — Anesthesia Postprocedure Evaluation (Addendum)
Anesthesia Post Note  Patient: Amanda Wise  Procedure(s) Performed: * No procedures listed *     Patient location during evaluation: Mother Baby Anesthesia Type: Epidural Level of consciousness: awake, awake and alert, oriented and patient cooperative Pain management: pain level controlled Vital Signs Assessment: post-procedure vital signs reviewed and stable Respiratory status: spontaneous breathing, nonlabored ventilation and respiratory function stable Cardiovascular status: stable Postop Assessment: no headache, no backache, patient able to bend at knees and no signs of nausea or vomiting Anesthetic complications: no Comments: Visit from CRNA A. Kilyn Maragh around 0800 this morning. Patient complaining of right foot numbness with right knee buckling ocassionally when she tries to stand/walk. I informed the patient that I would visit later on today.  Visit from CRNA A. Lekesha Claw around 1230 this day. Patient still complaining of right foot numbness, feeling like pins and needles. She says that she is progressively walking better. She says that during labor/delivery, a bar and towel were used during pushing. I informed her that this position during labor/delivery may have caused her right foot decreased sensation. She is having decreased dorsiflexion on right foot, compared to left foot. She did claim that it took a while to get feeling back in her right thigh, but that it is back to normal sensation at 1230 visit. I told patient that I would inform anesthesiologist and OB/GYN. MDA Dr. Hyacinth Meeker informed of this issue and we both agree that this is not related to epidural. OB/GYN with faculty practice called to make aware of issue, and to pass off care to [facility] provider. Mother/baby RN also made aware of this issue.  Iantha Fallen CRNA    Last Vitals:  Vitals:   08/07/16 2235 08/08/16 0225  BP: 124/79 119/72  Pulse: 87 87  Resp: 18 20  Temp: 37.3 C 37 C    Last Pain:  Vitals:   08/08/16 1113   TempSrc:   PainSc: 2    Pain Goal: Patients Stated Pain Goal: 1 (08/07/16 2135)               Tamrah Victorino L

## 2016-08-08 NOTE — Discharge Instructions (Signed)

## 2016-08-08 NOTE — Lactation Note (Signed)
This note was copied from a baby's chart. Lactation Consultation Note  Patient Name: Amanda Wise DPOEU'M Date: 08/08/2016 Reason for consult: Follow-up assessment;Difficult latch Second baby born at term, baby 9 lbs 9.8 oz.  Did not breastfeed first baby (48 months old), and only used manual pump, and her milk supply was minimal.  RN requested assistance with helping latch baby.  Baby is 15 hrs old, and had several attempts, but only one 5 minute feeding.   Baby in cross cradle hold, Mom supporting her breasts (heavy, and pendulous) in a C hold.  Baby fussing and trying to suckle on nipple.   Baby noted to have a narrow mouth.  Digital mouth exam revealed a very tight mouth, labial frenulum, and short lingual frenulum.  Baby's tongue remains low in mouth, and when sucking on finger, baby thrusting with tongue lifted in rear of mouth.  Baby unable to open widely at present.  Tried in football hold on right and left side.  Baby fussing and unable to latch deeply with sandwiching of tissue. Discussed with Mom importance of hand expression.  Demonstrated breast massage and hand expression.  Unable to express any colostrum. Recommended formula supplementation as baby was frantic.  Assisted Mom with using the cup to feed baby.  Baby's tongue did not extend very much, but baby sucked from cup 5 ml of Alimentum.  Plan- Encouraged continued STS.  Talked about need for supplementation of EBM+/formula, if baby continues to be unable to open wide enough to latch onto areola. DEBP set up at bedside, and assisted with first pumping.  Placed rolled up cloth diaper under breasts for support.   Mom aware of importance of regular pumping to support her milk supply, and regular supplementation.  If baby remains unable to attain a latch to breast, will recommend bottle feeding using the Pace Method to simulate milk flow from the breast. To call for help as needed.   Lactation to follow up in am, and prn.     Consult Status Consult Status: Follow-up Date: 08/09/16 Follow-up type: In-patient    Amanda Wise 08/08/2016, 1:15 PM

## 2016-08-09 NOTE — Progress Notes (Cosign Needed)
POSTPARTUM PROGRESS NOTE  Post Partum Day 2  Subjective:  Amanda Wise is a 31 y.o. [id number] [redacted]w[redacted]d s/p SVD.  No acute events overnight.  Pt denies problems with ambulating, voiding or po intake. She was complaining of right foot numbness and tingling yesterday but says this is now minimal. She is ambulating well.She denies nausea or vomiting.  Pain is well controlled. She has not had bowel movement. She last took a stool softener yesterday evening.    Objective: Blood pressure 116/61, pulse 68, temperature 97.9 F (36.6 C), temperature source Oral, resp. rate 18, height 5\' 7"  (1.702 m), weight (!) 156.9 kg (346 lb), last menstrual period 11/05/2015, SpO2 100 %, unknown if currently breastfeeding.  Physical Exam:  General: alert, cooperative and no distress Lochia:normal flow Uterine Fundus: firm DVT Evaluation: No calf swelling or tenderness   Recent Labs  08/07/16 0502 08/08/16 0529  HGB 11.8* 11.7*  HCT 34.7* 33.7*    Assessment/Plan:  ASSESSMENT: Amanda Wise is a 31 y.o. [id number] [redacted]w[redacted]d s/p SVD.   Discharge home, Breastfeeding and Contraception Nexplanon   LOS: 2 days   Jeanie Cooks, Medical Student

## 2016-08-09 NOTE — Discharge Summary (Signed)
OB Discharge Summary     Patient Name: Amanda Wise DOB: [birth year] MRN: [id number]  Date of admission: 08/07/2016 Delivering MD: SHIRLEY, [place]   Date of discharge: 08/09/2016  Admitting diagnosis: 39 WEEKS CTX Intrauterine pregnancy: [redacted]w[redacted]d     Secondary diagnosis:  Active Problems:   Labor and delivery indication for care or intervention   Spontaneous vaginal delivery  Additional problems:  Patient Active Problem List   Diagnosis Date Noted  . Labor and delivery indication for care or intervention 08/07/2016  . Spontaneous vaginal delivery 08/07/2016  . Positive GBS test 03/06/2015  . Vaginal delivery 03/06/2015  . Antepartum non-reassuring fetal heart rate or rhythm affecting care of mother 03/04/2015  . Status post fall 03/03/2015  . Abnormal antenatal test 03/03/2015        Discharge diagnosis: Term Pregnancy Delivered                                                                                                Post partum procedures:None  Augmentation: Pitocin  Complications: None  Hospital course:  Onset of Labor With Vaginal Delivery     31 y.o. yo G2P2002 at [redacted]w[redacted]d was admitted in labor on 08/07/2016. Patient had an uncomplicated labor course as follows:  Membrane Rupture Time/Date: 7:31 AM ,08/07/2016   Intrapartum Procedures: Episiotomy: None [1]                                         Lacerations:  2nd degree [3]  Patient had a delivery of a Viable infant. 08/07/2016  Information for the patient's newborn:  Carlea, [place] [[id number]]  Delivery Method: Vaginal, Spontaneous Delivery (Filed from Delivery Summary)    Pateint had an uncomplicated postpartum course.  She is ambulating, tolerating a regular diet, passing flatus, and urinating well. Patient is discharged home in stable condition on 08/09/16.   Physical exam  Vitals:   08/08/16 0225 08/08/16 1519 08/08/16 1722 08/09/16 0607  BP: 119/72 123/68 116/67 116/61  Pulse: 87 88 77 68  Resp: 20 18  20 18   Temp: 98.6 F (37 C)  98 F (36.7 C) 97.9 F (36.6 C)  TempSrc: Oral  Oral Oral  SpO2:  100%    Weight:      Height:       General: alert, cooperative and no distress Lochia: appropriate Uterine Fundus: firm Incision: N/A DVT Evaluation: No evidence of DVT seen on physical exam. Labs: Lab Results  Component Value Date   WBC 12.6 (H) 08/08/2016   HGB 11.7 (L) 08/08/2016   HCT 33.7 (L) 08/08/2016   MCV 88.9 08/08/2016   PLT 187 08/08/2016   CMP Latest Ref Rng & Units 02/13/2014  Glucose 70 - 99 mg/dL 89  BUN 6 - 23 mg/dL 13  Creatinine 1.10 - 3.15 mg/dL 9.45  Sodium 859 - 292 mmol/L 139  Potassium 3.5 - 5.1 mmol/L 4.2  Chloride 96 - 112 mEq/L 109  CO2 19 - 32 mmol/L 24  Calcium 8.4 - 10.5  mg/dL 8.9  Total Protein 6.0 - 8.3 g/dL 8.1  Total Bilirubin 0.3 - 1.2 mg/dL 0.4  Alkaline Phos 39 - 117 U/L 62  AST 0 - 37 U/L 25  ALT 0 - 35 U/L 20    Discharge instruction: per After Visit Summary and "Baby and Me Booklet".  After visit meds:  Allergies as of 08/09/2016   No Known Allergies     Medication List    STOP taking these medications   aspirin EC 81 MG tablet     TAKE these medications   ibuprofen 600 MG tablet Commonly known as:  ADVIL,MOTRIN Take 1 tablet (600 mg total) by mouth every 6 (six) hours.   prenatal multivitamin Tabs tablet Take 1 tablet by mouth every morning.       Diet: routine diet  Activity: Advance as tolerated. Pelvic rest for 6 weeks.   Outpatient follow up:6 weeks Follow up Appt:No future appointments. Follow up Visit:No Follow-up on file.  Postpartum contraception: Nexplanon  Newborn Data: Live born female  Birth Weight: 9 lb 9.8 oz (4360 g) APGAR: 8, 9  Baby Feeding: Breast Disposition:home with mother   08/09/2016 Howard Pouch, MD

## 2016-08-09 NOTE — Lactation Note (Signed)
This note was copied from a baby's chart. Lactation Consultation Note  Patient Name: Boy Devany Paulman ZWCHE'N Date: 08/09/2016 Reason for consult: Follow-up assessment;Difficult latch;Breast/nipple pain  In to visit Mom on day of discharge, baby 35 hrs old.  Baby at 4% weight loss.  Being bottle fed due to difficult time latching and staying latched to the breast.  Baby frantic and rigid at the breast.  Assisted in cross cradle hold and football hold.  Baby opens a little wider, but unable to sustain a latch as she is pushing nipple out of her mouth.  Mom assisted to support her breast to facilitate a deep latch.  Mom states she last pumped 5 hrs ago.   Encourage hand expression and breast massage. Mom encouraged to pump 8-12 times per 24 hrs.  Keep baby STS and supplement baby while baby unable to attain a latch.  Recommended an OP lactation appointment for assistance to assess milk supply, and receive help latching baby. WIC loaner given. To call prn for concerns, or assistance.    Consult Status Consult Status: Follow-up Date: 08/09/16 Follow-up type: Call as needed    [name] 08/09/2016, 9:54 AM

## 2017-08-17 ENCOUNTER — Other Ambulatory Visit: Payer: Self-pay

## 2017-08-17 ENCOUNTER — Encounter (HOSPITAL_COMMUNITY): Payer: Self-pay | Admitting: Emergency Medicine

## 2017-08-17 ENCOUNTER — Emergency Department (HOSPITAL_COMMUNITY)
Admission: EM | Admit: 2017-08-17 | Discharge: 2017-08-17 | Disposition: A | Discharge status: Home / Self Care | Payer: Self-pay | Attending: Emergency Medicine | Admitting: Emergency Medicine

## 2017-08-17 ENCOUNTER — Emergency Department (HOSPITAL_COMMUNITY): Payer: Self-pay

## 2017-08-17 DIAGNOSIS — S39012A Strain of muscle, fascia and tendon of lower back, initial encounter: Secondary | ICD-10-CM | POA: Insufficient documentation

## 2017-08-17 DIAGNOSIS — Y33XXXA Other specified events, undetermined intent, initial encounter: Secondary | ICD-10-CM | POA: Insufficient documentation

## 2017-08-17 DIAGNOSIS — Y998 Other external cause status: Secondary | ICD-10-CM | POA: Insufficient documentation

## 2017-08-17 DIAGNOSIS — Y939 Activity, unspecified: Secondary | ICD-10-CM | POA: Insufficient documentation

## 2017-08-17 DIAGNOSIS — I10 Essential (primary) hypertension: Secondary | ICD-10-CM | POA: Insufficient documentation

## 2017-08-17 DIAGNOSIS — Y929 Unspecified place or not applicable: Secondary | ICD-10-CM | POA: Insufficient documentation

## 2017-08-17 LAB — URINALYSIS, ROUTINE W REFLEX MICROSCOPIC
Bacteria, UA: NONE SEEN
Bilirubin Urine: NEGATIVE
Glucose, UA: NEGATIVE mg/dL
Hgb urine dipstick: NEGATIVE
Ketones, ur: NEGATIVE mg/dL
Nitrite: NEGATIVE
Protein, ur: NEGATIVE mg/dL
Specific Gravity, Urine: 1.006 (ref 1.005–1.030)
pH: 5 (ref 5.0–8.0)

## 2017-08-17 LAB — I-STAT BETA HCG BLOOD, ED (MC, WL, AP ONLY): I-stat hCG, quantitative: <5 m[IU]/mL (ref <5)

## 2017-08-17 MED ORDER — IBUPROFEN 200 MG PO TABS
600.0000 mg | ORAL_TABLET | Freq: Once | ORAL | Status: AC
  Administered 2017-08-17: 600 mg via ORAL
  Filled 2017-08-17: qty 3

## 2017-08-17 MED ORDER — IBUPROFEN 600 MG PO TABS: 600.0000 mg | ORAL_TABLET | Freq: Three times a day (TID) | ORAL | 0 refills | Status: DC | PRN

## 2017-08-17 MED ORDER — METHOCARBAMOL 500 MG PO TABS: 1000.0000 mg | ORAL_TABLET | Freq: Three times a day (TID) | ORAL | 0 refills | Status: DC | PRN

## 2017-08-17 NOTE — ED Notes (Signed)
Patient states her back pain is lower right/left for about a week. Patient also states she has trouble breathing at night when she sleeps.

## 2017-08-17 NOTE — ED Triage Notes (Signed)
Pt arriving with lower back pain present x1 week. Pt also states at times she has difficulty "getting enough air."

## 2017-08-17 NOTE — Discharge Instructions (Addendum)
It was our pleasure to provide your ER care today - we hope that you feel better.  Avoid heavy lifting > 20 lbs, or bending at waist, for the next week.  Try heat to sore area. Take motrin as need for pain. You may try robaxin as need for muscle pain/spasm - no driving when taking.   Follow up with primary care doctor in 1 week if symptoms fail to improve/resolve.  Return to ER if worse, new symptoms, fevers, severe pain, other concern.

## 2017-08-17 NOTE — ED Notes (Signed)
Patient aware we need urine sample.  

## 2017-08-17 NOTE — ED Notes (Signed)
Amanda Wise needs a urine sample before she can go to radiology.

## 2017-08-17 NOTE — ED Provider Notes (Signed)
[facility]-EMERGENCY DEPT Provider Note   CSN: [id number] Arrival date & time: 08/17/17  0535     History   Chief Complaint Chief Complaint  Patient presents with  . Back Pain    HPI Amanda Wise is a 32 y.o. female.  Patient c/o low back pain for past week. Pain constant, dull, moderate, non radiating. Denies specific injury or strain. No hx chronic back pain or ddd. No numbness/weakness. No urinary or bowel symptoms or difficulty. No anterior/abd pain. Denies fever or chills. Has tried acetaminophen without relief of pain.   The history is provided by the patient.  Back Pain   Pertinent negatives include no chest pain, no fever, no numbness, no dysuria and no weakness.    Past Medical History:  Diagnosis Date  . Anemia   . Heart palpitations   . Hypertension   . Pregnancy induced hypertension     Patient Active Problem List   Diagnosis Date Noted  . Labor and delivery indication for care or intervention 08/07/2016  . Spontaneous vaginal delivery 08/07/2016  . Positive GBS test 03/06/2015  . Vaginal delivery 03/06/2015  . Antepartum non-reassuring fetal heart rate or rhythm affecting care of mother 03/04/2015  . Status post fall 03/03/2015  . Abnormal antenatal test 03/03/2015    Past Surgical History:  Procedure Laterality Date  . NO PAST SURGERIES       OB History    Gravida  2   Para  2   Term  2   Preterm      AB      Living  2     SAB      TAB      Ectopic      Multiple  0   Live Births  2            Home Medications    Prior to Admission medications   Medication Sig Start Date End Date Taking? Authorizing Provider  ibuprofen (ADVIL,MOTRIN) 600 MG tablet Take 1 tablet (600 mg total) by mouth every 6 (six) hours. 08/08/16   Allie Bossier, MD  Prenatal Vit-Fe Fumarate-FA (PRENATAL MULTIVITAMIN) TABS tablet Take 1 tablet by mouth every morning.     [provider]    Family History Family History    Problem Relation Age of Onset  . Hypertension Other   . Hypertension Maternal Grandmother   . Hypertension Paternal Grandmother     Social History Social History   Tobacco Use  . Smoking status: Never Smoker  . Smokeless tobacco: Never Used  Substance Use Topics  . Alcohol use: No  . Drug use: No     Allergies   Patient has no known allergies.   Review of Systems Review of Systems  Constitutional: Negative for fever.  Respiratory: Negative for shortness of breath.   Cardiovascular: Negative for chest pain and leg swelling.  Genitourinary: Negative for dysuria and hematuria.  Musculoskeletal: Positive for back pain. Negative for neck pain.  Skin: Negative for rash.  Neurological: Negative for weakness and numbness.     Physical Exam Updated Vital Signs BP (!) 169/97 (BP Location: Left Arm)   Pulse 74   Temp 98.3 F (36.8 C) (Oral)   Resp 16   Ht 1.702 m (5\' 7" )   Wt (!) 145.2 kg (320 lb)   LMP 07/17/2017   SpO2 98%   BMI 50.12 kg/m   Physical Exam  Constitutional: She appears well-developed and well-nourished.  HENT:  Head: Atraumatic.  Eyes: Conjunctivae are normal. No scleral icterus.  Neck: Neck supple. No tracheal deviation present.  Cardiovascular: Normal rate and intact distal pulses.  Pulmonary/Chest: Effort normal and breath sounds normal. No respiratory distress.  Abdominal: Normal appearance. She exhibits no distension. There is no tenderness.  Genitourinary:  Genitourinary Comments: No cva tenderness  Musculoskeletal: She exhibits no edema.  Mid to lower lumbar tenderness, otherwise, CTLS spine, non tender, aligned, no step off.   Neurological: She is alert.  Motor intact bil ext, stre 5/5. sens grossly intact. Steady gait.   Skin: Skin is warm and dry. No rash noted. She is not diaphoretic.  Psychiatric: She has a normal mood and affect.  Nursing note and vitals reviewed.    ED Treatments / Results  Labs (all labs ordered are  listed, but only abnormal results are displayed) Results for orders placed or performed during the hospital encounter of 08/17/17  Urinalysis, Routine w reflex microscopic  Result Value Ref Range   Color, Urine STRAW (A) YELLOW   APPearance CLEAR CLEAR   Specific Gravity, Urine 1.006 1.005 - 1.030   pH 5.0 5.0 - 8.0   Glucose, UA NEGATIVE NEGATIVE mg/dL   Hgb urine dipstick NEGATIVE NEGATIVE   Bilirubin Urine NEGATIVE NEGATIVE   Ketones, ur NEGATIVE NEGATIVE mg/dL   Protein, ur NEGATIVE NEGATIVE mg/dL   Nitrite NEGATIVE NEGATIVE   Leukocytes, UA TRACE (A) NEGATIVE   RBC / HPF 0-5 0 - 5 RBC/hpf   WBC, UA 0-5 0 - 5 WBC/hpf   Bacteria, UA NONE SEEN NONE SEEN   Squamous Epithelial / LPF 0-5 0 - 5  I-Stat Beta hCG blood, ED ([facility], [facility], [facility] only)  Result Value Ref Range   I-stat hCG, quantitative <5.0 <5 mIU/mL   Comment 3           EKG None  Radiology No results found.  Procedures Procedures (including critical care time)  Medications Ordered in ED Medications  ibuprofen (ADVIL,MOTRIN) tablet 600 mg (has no administration in time range)     Initial Impression / Assessment and Plan / ED Course  I have reviewed the triage vital signs and the nursing notes.  Pertinent labs & imaging results that were available during my care of the patient were reviewed by me and considered in my medical decision making (see chart for details).  Pt drove self to ED. Motrin po.  Xrays.   Reviewed nursing notes and prior charts for additional history.   Pt declines xrays. Requests urine be checked.  ua reviewed - neg for infxn.   Patient appears stable for d/c.     Final Clinical Impressions(s) / ED Diagnoses   Final diagnoses:  None    ED Discharge Orders    None       Cathren Laine, MD 08/17/17 1029

## 2017-09-24 ENCOUNTER — Encounter (HOSPITAL_BASED_OUTPATIENT_CLINIC_OR_DEPARTMENT_OTHER): Payer: Self-pay

## 2017-09-24 ENCOUNTER — Other Ambulatory Visit: Payer: Self-pay

## 2017-09-24 ENCOUNTER — Emergency Department (HOSPITAL_BASED_OUTPATIENT_CLINIC_OR_DEPARTMENT_OTHER)
Admission: EM | Admit: 2017-09-24 | Discharge: 2017-09-24 | Disposition: A | Discharge status: Home / Self Care | Payer: Self-pay | Attending: Emergency Medicine | Admitting: Emergency Medicine

## 2017-09-24 DIAGNOSIS — I10 Essential (primary) hypertension: Secondary | ICD-10-CM | POA: Insufficient documentation

## 2017-09-24 DIAGNOSIS — N390 Urinary tract infection, site not specified: Secondary | ICD-10-CM | POA: Insufficient documentation

## 2017-09-24 LAB — URINALYSIS, ROUTINE W REFLEX MICROSCOPIC
Bilirubin Urine: NEGATIVE
GLUCOSE, UA: NEGATIVE mg/dL
HGB URINE DIPSTICK: NEGATIVE
Ketones, ur: NEGATIVE mg/dL
Nitrite: NEGATIVE
PH: 6 (ref 5.0–8.0)
Protein, ur: NEGATIVE mg/dL
Specific Gravity, Urine: <1.005 — ABNORMAL LOW (ref 1.005–1.030)

## 2017-09-24 LAB — URINALYSIS, MICROSCOPIC (REFLEX)

## 2017-09-24 LAB — PREGNANCY, URINE: Preg Test, Ur: NEGATIVE

## 2017-09-24 MED ORDER — CEPHALEXIN 500 MG PO CAPS: 500.0000 mg | ORAL_CAPSULE | Freq: Four times a day (QID) | ORAL | 0 refills | Status: DC

## 2017-09-24 MED ORDER — CEPHALEXIN 250 MG PO CAPS
500.0000 mg | ORAL_CAPSULE | Freq: Once | ORAL | Status: AC
  Administered 2017-09-24: 500 mg via ORAL
  Filled 2017-09-24: qty 2

## 2017-09-24 NOTE — ED Triage Notes (Signed)
Pt reports nausea x3 days w/o emesis and "weird" left ear sensation. Pt denies diarrhea and abd pain. Pt A+OX4, NAD.

## 2017-09-24 NOTE — ED Provider Notes (Signed)
[facility] EMERGENCY DEPARTMENT Provider Note   CSN: [id number] Arrival date & time: 09/24/17  0135     History   Chief Complaint Chief Complaint  Patient presents with  . Nausea    HPI Amanda Wise is a 32 y.o. female.  The history is provided by the patient.  Illness  This is a new problem. The current episode started more than 2 days ago. The problem occurs constantly. The problem has not changed since onset.Pertinent negatives include no chest pain, no abdominal pain, no headaches and no shortness of breath. Nothing aggravates the symptoms. Nothing relieves the symptoms. She has tried nothing for the symptoms. The treatment provided no relief.  Patient reports nausea and left ear fullness x 3 days.  No f/c/r.  No dizziness,  No vertigo. No discharge.  No weakness or numbness, No HA.  No abdominal pain.    Past Medical History:  Diagnosis Date  . Anemia   . Heart palpitations   . Hypertension   . Pregnancy induced hypertension     Patient Active Problem List   Diagnosis Date Noted  . Labor and delivery indication for care or intervention 08/07/2016  . Spontaneous vaginal delivery 08/07/2016  . Positive GBS test 03/06/2015  . Vaginal delivery 03/06/2015  . Antepartum non-reassuring fetal heart rate or rhythm affecting care of mother 03/04/2015  . Status post fall 03/03/2015  . Abnormal antenatal test 03/03/2015    Past Surgical History:  Procedure Laterality Date  . NO PAST SURGERIES       OB History    Gravida  2   Para  2   Term  2   Preterm      AB      Living  2     SAB      TAB      Ectopic      Multiple  0   Live Births  2            Home Medications    Prior to Admission medications   Medication Sig Start Date End Date Taking? Authorizing Provider  ibuprofen (ADVIL,MOTRIN) 600 MG tablet Take 1 tablet (600 mg total) by mouth every 8 (eight) hours as needed. Take with food. 08/17/17  Yes Cathren Laine, MD  Multiple  Vitamins-Minerals (MULTIVITAMIN ADULTS) TABS Take 1 tablet by mouth daily.   Yes [provider]  cephALEXin (KEFLEX) 500 MG capsule Take 1 capsule (500 mg total) by mouth 4 (four) times daily. 09/24/17   Johnetta Sloniker, MD  ibuprofen (ADVIL,MOTRIN) 600 MG tablet Take 1 tablet (600 mg total) by mouth every 6 (six) hours. Patient not taking: Reported on 08/17/2017 08/08/16   Allie Bossier, MD  IRON PO Take 1 tablet by mouth daily.    [provider]  methocarbamol (ROBAXIN) 500 MG tablet Take 2 tablets (1,000 mg total) by mouth 3 (three) times daily as needed for muscle spasms. 08/17/17   Cathren Laine, MD    Family History Family History  Problem Relation Age of Onset  . Hypertension Other   . Hypertension Maternal Grandmother   . Hypertension Paternal Grandmother     Social History Social History   Tobacco Use  . Smoking status: Never Smoker  . Smokeless tobacco: Never Used  Substance Use Topics  . Alcohol use: No  . Drug use: No     Allergies   Patient has no known allergies.   Review of Systems Review of Systems  Constitutional: Negative  for fever.  HENT: Positive for ear pain. Negative for congestion, drooling, ear discharge, facial swelling, tinnitus, trouble swallowing and voice change.   Eyes: Negative for visual disturbance.  Respiratory: Negative for shortness of breath.   Cardiovascular: Negative for chest pain, palpitations and leg swelling.  Gastrointestinal: Negative for abdominal pain.  Genitourinary: Negative for difficulty urinating and flank pain.  Musculoskeletal: Negative for neck pain.  Neurological: Negative for dizziness, tremors, seizures, syncope, facial asymmetry, speech difficulty, weakness, light-headedness, numbness and headaches.  All other systems reviewed and are negative.    Physical Exam Updated Vital Signs BP 106/66 (BP Location: Right Arm)   Pulse 68   Temp 98.6 F (37 C) (Oral)   Resp 20   LMP 07/08/2017   SpO2 99%    Physical Exam  Constitutional: She is oriented to person, place, and time. She appears well-developed and well-nourished. No distress.  HENT:  Head: Normocephalic and atraumatic.  Right Ear: External ear normal. No mastoid tenderness. Tympanic membrane is not injected, not scarred, not perforated, not erythematous, not retracted and not bulging. No hemotympanum.  Left Ear: External ear normal. No mastoid tenderness. Tympanic membrane is not injected, not scarred, not perforated, not erythematous, not retracted and not bulging. No hemotympanum.  Nose: Nose normal.  Mouth/Throat: Oropharynx is clear and moist. No oropharyngeal exudate.  Eyes: Pupils are equal, round, and reactive to light. Conjunctivae and EOM are normal.  Neck: Normal range of motion. Neck supple.  Cardiovascular: Normal rate, regular rhythm, normal heart sounds and intact distal pulses.  Pulmonary/Chest: Effort normal and breath sounds normal. No stridor. No respiratory distress.  Abdominal: Soft. Bowel sounds are normal. She exhibits no mass. There is no tenderness. There is no guarding.  Musculoskeletal: Normal range of motion.  Neurological: She is alert and oriented to person, place, and time. She displays normal reflexes. No cranial nerve deficit or sensory deficit. She exhibits normal muscle tone.  Skin: Skin is warm and dry. Capillary refill takes less than 2 seconds.  Psychiatric: She has a normal mood and affect.  Nursing note and vitals reviewed.    ED Treatments / Results  Labs (all labs ordered are listed, but only abnormal results are displayed) Labs Reviewed  URINALYSIS, ROUTINE W REFLEX MICROSCOPIC - Abnormal; Notable for the following components:      Result Value   Color, Urine STRAW (*)    APPearance CLOUDY (*)    Specific Gravity, Urine <1.005 (*)    Leukocytes, UA LARGE (*)    All other components within normal limits  URINALYSIS, MICROSCOPIC (REFLEX) - Abnormal; Notable for the following  components:   Bacteria, UA MANY (*)    All other components within normal limits  PREGNANCY, URINE    EKG None  Radiology No results found.  Procedures Procedures (including critical care time)  Medications Ordered in ED Medications  cephALEXin (KEFLEX) capsule 500 mg (500 mg Oral Given 09/24/17 0247)     Final Clinical Impressions(s) / ED Diagnoses   Final diagnoses:  Lower urinary tract infectious disease    Ears are normal on exam. Will treat for UTI as this is likely the source of the nausea.    Return for fevers >100.4 unrelieved by medication, shortness of breath, intractable vomiting, or diarrhea, abdominal pain especially that localizes to the right lower quadrant, Inability to tolerate liquids or food, cough, altered mental status or any concerns. No signs of systemic illness or infection. The patient is nontoxic-appearing on exam and vital signs are  within normal limits. Will refer to urology for microscopy hematuria as patient is asymptomatic.  I have reviewed the triage vital signs and the nursing notes. Pertinent labs &imaging results that were available during my care of the patient were reviewed by me and considered in my medical decision making (see chart for details).  After history, exam, and medical workup I feel the patient has been appropriately medically screened and is safe for discharge home. Pertinent diagnoses were discussed with the patient. Patient was given return precautions.   ED Discharge Orders         Ordered    cephALEXin (KEFLEX) 500 MG capsule  4 times daily     09/24/17 0244           Athanasius Kesling, MD 09/24/17 1610

## 2018-01-21 ENCOUNTER — Emergency Department (HOSPITAL_COMMUNITY): Payer: Managed Care, Other (non HMO)

## 2018-01-21 ENCOUNTER — Emergency Department (HOSPITAL_COMMUNITY)
Admission: EM | Admit: 2018-01-21 | Discharge: 2018-01-22 | Disposition: A | Discharge status: Home / Self Care | Payer: Managed Care, Other (non HMO) | Attending: Emergency Medicine | Admitting: Emergency Medicine

## 2018-01-21 DIAGNOSIS — R0602 Shortness of breath: Secondary | ICD-10-CM | POA: Insufficient documentation

## 2018-01-21 DIAGNOSIS — R0789 Other chest pain: Secondary | ICD-10-CM | POA: Insufficient documentation

## 2018-01-21 DIAGNOSIS — I10 Essential (primary) hypertension: Secondary | ICD-10-CM | POA: Insufficient documentation

## 2018-01-21 DIAGNOSIS — Z79899 Other long term (current) drug therapy: Secondary | ICD-10-CM | POA: Diagnosis not present

## 2018-01-21 DIAGNOSIS — R110 Nausea: Secondary | ICD-10-CM | POA: Diagnosis not present

## 2018-01-21 LAB — CBC
HCT: 39.5 % (ref 36.0–46.0)
Hemoglobin: 13 g/dL (ref 12.0–15.0)
MCH: 29.1 pg (ref 26.0–34.0)
MCHC: 32.9 g/dL (ref 30.0–36.0)
MCV: 88.4 fL (ref 80.0–100.0)
NRBC: 0 % (ref 0.0–0.2)
Platelets: 298 10*3/uL (ref 150–400)
RBC: 4.47 MIL/uL (ref 3.87–5.11)
RDW: 12.6 % (ref 11.5–15.5)
WBC: 8 10*3/uL (ref 4.0–10.5)

## 2018-01-21 LAB — I-STAT BETA HCG BLOOD, ED (MC, WL, AP ONLY)

## 2018-01-21 LAB — I-STAT TROPONIN, ED: Troponin i, poc: 0.01 ng/mL (ref 0.00–0.08)

## 2018-01-21 NOTE — ED Triage Notes (Signed)
Pt reports CP onset yesterday. Central with radiation into back. Pt states pain is currently 1/10, yesterday it was 8/10. Pt in NAD.

## 2018-01-22 LAB — BASIC METABOLIC PANEL
ANION GAP: 10 (ref 5–15)
BUN: 7 mg/dL (ref 6–20)
CALCIUM: 9.1 mg/dL (ref 8.9–10.3)
CHLORIDE: 103 mmol/L (ref 98–111)
CO2: 25 mmol/L (ref 22–32)
Creatinine, Ser: 0.97 mg/dL (ref 0.44–1.00)
GFR calc Af Amer: >60 mL/min (ref >60)
GFR calc non Af Amer: >60 mL/min (ref >60)
Glucose, Bld: 103 mg/dL — ABNORMAL HIGH (ref 70–99)
Potassium: 3.8 mmol/L (ref 3.5–5.1)
Sodium: 138 mmol/L (ref 135–145)

## 2018-01-22 LAB — LIPASE, BLOOD: Lipase: 37 U/L (ref 11–51)

## 2018-01-22 LAB — HEPATIC FUNCTION PANEL
ALT: 16 U/L (ref 0–44)
AST: 18 U/L (ref 15–41)
Albumin: 3.7 g/dL (ref 3.5–5.0)
Alkaline Phosphatase: 61 U/L (ref 38–126)
Bilirubin, Direct: <0.1 mg/dL (ref 0.0–0.2)
Total Bilirubin: 0.6 mg/dL (ref 0.3–1.2)
Total Protein: 7.9 g/dL (ref 6.5–8.1)

## 2018-01-22 NOTE — Discharge Instructions (Signed)
The remainder of your lab work was normal.  Your presentation does not seem consistent with a heart attack or a blood clot in the lungs.  You can alternate 600 mg of ibuprofen and [id number] mg of Tylenol every 3 hours as needed for pain. Do not exceed 4000 mg of Tylenol daily.  Take ibuprofen with food to avoid upset stomach issues.  Drink plenty of fluids and get plenty of rest.  I have attached information about dietary modifications that could help with maintaining your blood pressure.  Follow-up with your primary care physician or cardiologist for reevaluation of your symptoms.  Call the number on the back of your insurance card or go online to see who is in your network and call to set up an appointment.  Return to the emergency department immediately for any concerning signs or symptoms develop such as persistent chest pain, lightheadedness, breaking out to a cold sweat, persistent shortness of breath, pain with deep breaths,  high fevers or passing out.

## 2018-01-22 NOTE — ED Provider Notes (Signed)
[facility] EMERGENCY DEPARTMENT Provider Note   CSN: [id number] Arrival date & time: 01/21/18  2317     History   Chief Complaint Chief Complaint  Patient presents with  . Chest Pain    HPI Amanda Wise is a 32 y.o. female with history of anemia, heart palpitations, hypertension presents for evaluation of acute onset, resolved episode of chest pain.  She notes that yesterday while at work she experienced sharp right-sided chest pain that lasted approximately 10 minutes before resolving.  Pain worsened with deep inspiration.  She did note associated nausea that has been ongoing all day but she notes that this is not unusual for her and resolved after taking Pepto-Bismol and Mylanta.  Earlier today at around 9 PM while sitting down at her workstation shortly after eating she began to feel more severe right-sided chest pain radiating up to her right shoulder.  Again pain lasted approximately 10 minutes before resolving entirely.  Worsened with deep inspiration.  No aggravating or alleviating factors otherwise.  She denies any associated lightheadedness, diaphoresis, vomiting, abdominal pain.  She has not tried anything for her symptoms.  No recurrence of chest pains today.  Denies recent travel or surgeries, hemoptysis, prior history of DVT or PE, hormone replacement therapy, or history of cancer.  No significant family history of heart disease/MI at a young age.  She is a non-smoker, denies recreational drug use or alcohol intake.  She notes that she has been told her blood pressure has been high in the past but does not take any medications for it currently.  She is in the process of establishing care with a PCP.  The history is provided by the patient.    Past Medical History:  Diagnosis Date  . Anemia   . Heart palpitations   . Hypertension   . Pregnancy induced hypertension     Patient Active Problem List   Diagnosis Date Noted  . Labor and delivery indication  for care or intervention 08/07/2016  . Spontaneous vaginal delivery 08/07/2016  . Positive GBS test 03/06/2015  . Vaginal delivery 03/06/2015  . Antepartum non-reassuring fetal heart rate or rhythm affecting care of mother 03/04/2015  . Status post fall 03/03/2015  . Abnormal antenatal test 03/03/2015    Past Surgical History:  Procedure Laterality Date  . NO PAST SURGERIES       OB History    Gravida  2   Para  2   Term  2   Preterm      AB      Living  2     SAB      TAB      Ectopic      Multiple  0   Live Births  2            Home Medications    Prior to Admission medications   Medication Sig Start Date End Date Taking? Authorizing Provider  cephALEXin (KEFLEX) 500 MG capsule Take 1 capsule (500 mg total) by mouth 4 (four) times daily. 09/24/17   Palumbo, April, MD  ibuprofen (ADVIL,MOTRIN) 600 MG tablet Take 1 tablet (600 mg total) by mouth every 6 (six) hours. Patient not taking: Reported on 08/17/2017 08/08/16   Allie Bossier, MD  ibuprofen (ADVIL,MOTRIN) 600 MG tablet Take 1 tablet (600 mg total) by mouth every 8 (eight) hours as needed. Take with food. 08/17/17   Cathren Laine, MD  IRON PO Take 1 tablet by mouth daily.  [provider]  methocarbamol (ROBAXIN) 500 MG tablet Take 2 tablets (1,000 mg total) by mouth 3 (three) times daily as needed for muscle spasms. 08/17/17   Cathren Laine, MD  Multiple Vitamins-Minerals (MULTIVITAMIN ADULTS) TABS Take 1 tablet by mouth daily.    [provider]    Family History Family History  Problem Relation Age of Onset  . Hypertension Other   . Hypertension Maternal Grandmother   . Hypertension Paternal Grandmother     Social History Social History   Tobacco Use  . Smoking status: Never Smoker  . Smokeless tobacco: Never Used  Substance Use Topics  . Alcohol use: No  . Drug use: No     Allergies   Patient has no known allergies.   Review of Systems Review of Systems    Constitutional: Negative for chills, diaphoresis and fever.  Respiratory: Positive for shortness of breath. Negative for cough.   Cardiovascular: Positive for chest pain.  Gastrointestinal: Positive for nausea. Negative for abdominal pain and vomiting.  Neurological: Negative for light-headedness.  All other systems reviewed and are negative.    Physical Exam Updated Vital Signs BP 122/82 (BP Location: Right Arm)   Pulse 74   Temp 98.3 F (36.8 C) (Oral)   Resp 16   LMP 12/26/2017   SpO2 100%   Physical Exam Vitals signs and nursing note reviewed.  Constitutional:      General: She is not in acute distress.    Appearance: She is well-developed. She is obese.  HENT:     Head: Normocephalic and atraumatic.  Eyes:     General:        Right eye: No discharge.        Left eye: No discharge.     Conjunctiva/sclera: Conjunctivae normal.  Neck:     Vascular: No JVD.     Trachea: No tracheal deviation.  Cardiovascular:     Rate and Rhythm: Normal rate.     Pulses:          Carotid pulses are 2+ on the right side and 2+ on the left side.      Radial pulses are 2+ on the right side and 2+ on the left side.       Dorsalis pedis pulses are 2+ on the right side and 2+ on the left side.       Posterior tibial pulses are 2+ on the right side and 2+ on the left side.     Heart sounds: Normal heart sounds. No murmur.     Comments:  Homans sign absent bilaterally, no lower extremity edema, no palpable cords, compartments are soft  Pulmonary:     Effort: Pulmonary effort is normal.     Comments: Examination limited due to body habitus.  Distant breath sounds.  No tachypnea, speaking in full sentences without difficulty Chest:     Chest wall: No tenderness.  Abdominal:     General: There is no distension.     Palpations: Abdomen is soft.     Tenderness: There is no abdominal tenderness.  Musculoskeletal:     Right lower leg: She exhibits no tenderness. No edema.     Left lower  leg: She exhibits no tenderness. No edema.  Skin:    General: Skin is warm and dry.     Findings: No erythema.  Neurological:     Mental Status: She is alert.  Psychiatric:        Behavior: Behavior normal.  ED Treatments / Results  Labs (all labs ordered are listed, but only abnormal results are displayed) Labs Reviewed  BASIC METABOLIC PANEL - Abnormal; Notable for the following components:      Result Value   Glucose, Bld 103 (*)    All other components within normal limits  CBC  LIPASE, BLOOD  HEPATIC FUNCTION PANEL  I-STAT TROPONIN, ED  I-STAT BETA HCG BLOOD, ED ([facility], [facility], [facility] ONLY)    EKG EKG Interpretation  Date/Time:  Tuesday January 21 2018 23:28:58 EST Ventricular Rate:  89 PR Interval:  160 QRS Duration: 84 QT Interval:  342 QTC Calculation: 416 R Axis:   31 Text Interpretation:  Normal sinus rhythm Normal ECG Confirmed by Ross Marcus ([id number]) on 01/22/2018 2:14:15 AM   Radiology Dg Chest 2 View  Result Date: 01/22/2018 CLINICAL DATA:  Chest pain, onset yesterday but progressive. Shortness of breath. EXAM: CHEST - 2 VIEW COMPARISON:  Radiograph 02/13/2014 FINDINGS: The cardiomediastinal contours are normal. The lungs are clear. Pulmonary vasculature is normal. No consolidation, pleural effusion, or pneumothorax. No acute osseous abnormalities are seen. IMPRESSION: No acute pulmonary process. Electronically Signed   By: Narda Rutherford M.D.   On: 01/22/2018 00:05    Procedures Procedures (including critical care time)  Medications Ordered in ED Medications - No data to display   Initial Impression / Assessment and Plan / ED Course  I have reviewed the triage vital signs and the nursing notes.  Pertinent labs & imaging results that were available during my care of the patient were reviewed by me and considered in my medical decision making (see chart for details).     Patient presenting for evaluation of 2 episodes of right-sided chest pain  between yesterday and today.  She is currently pain-free.  She is afebrile, vital signs are stable.  She is nontoxic in appearance.  Pain is not exertional or reproducible on palpation.  She is low risk for cardiac disease.  Her EKG shows normal sinus rhythm, no ischemic changes or arrhythmia.  Initial troponin is negative.  We had a shared decision-making discussion regarding the utility of obtaining a second troponin and she states she does not wish to stay any longer for additional lab work.  I think this is reasonable as her symptoms do not sound cardiac in etiology.  Remainder of lab work reviewed by me shows no leukocytosis, no metabolic derangements, no renal insufficiency.  LFTs and lipase within normal limits.  Abdomen is soft and nontender.  No evidence of acute surgical abdominal pathology.  Chest x-ray shows no acute cardiopulmonary abnormalities.  No evidence of pneumonia, pleural effusion, or pericarditis.  Doubt PE, dissection, cardiac tamponade, pneumothorax.  No further emergent work-up required at this time.  Recommend anti-inflammatories.  She has a diagnosis of hypertension on her problem list but has not been hypertensive while in the ED.  Recommend follow-up with PCP or cardiologist for reevaluation.  Discussed ED return precautions. Pt verbalized understanding of and agreement with plan and is safe for discharge home at this time. No complaints prior to discharge.   Final Clinical Impressions(s) / ED Diagnoses   Final diagnoses:  Atypical chest pain    ED Discharge Orders    None       Jeanie Sewer, PA-C 01/22/18 8413    Shon Baton, MD 01/22/18 [phone]

## 2018-01-29 ENCOUNTER — Encounter (HOSPITAL_COMMUNITY): Payer: Self-pay

## 2018-01-29 ENCOUNTER — Inpatient Hospital Stay (HOSPITAL_COMMUNITY)
Admission: EM | Admit: 2018-01-29 | Discharge: 2018-02-03 | DRG: 059 | Disposition: A | Discharge status: Home / Self Care | Payer: Managed Care, Other (non HMO) | Attending: Internal Medicine | Admitting: Internal Medicine

## 2018-01-29 DIAGNOSIS — R202 Paresthesia of skin: Secondary | ICD-10-CM | POA: Diagnosis present

## 2018-01-29 DIAGNOSIS — Z23 Encounter for immunization: Secondary | ICD-10-CM

## 2018-01-29 DIAGNOSIS — Z791 Long term (current) use of non-steroidal anti-inflammatories (NSAID): Secondary | ICD-10-CM

## 2018-01-29 DIAGNOSIS — Z79899 Other long term (current) drug therapy: Secondary | ICD-10-CM

## 2018-01-29 DIAGNOSIS — Z8249 Family history of ischemic heart disease and other diseases of the circulatory system: Secondary | ICD-10-CM

## 2018-01-29 DIAGNOSIS — H5712 Ocular pain, left eye: Secondary | ICD-10-CM | POA: Diagnosis present

## 2018-01-29 DIAGNOSIS — I10 Essential (primary) hypertension: Secondary | ICD-10-CM | POA: Diagnosis present

## 2018-01-29 DIAGNOSIS — G35 Multiple sclerosis: Principal | ICD-10-CM | POA: Diagnosis present

## 2018-01-29 DIAGNOSIS — D72829 Elevated white blood cell count, unspecified: Secondary | ICD-10-CM | POA: Diagnosis present

## 2018-01-29 DIAGNOSIS — G4700 Insomnia, unspecified: Secondary | ICD-10-CM | POA: Diagnosis not present

## 2018-01-29 DIAGNOSIS — G35A Relapsing-remitting multiple sclerosis: Secondary | ICD-10-CM | POA: Diagnosis present

## 2018-01-29 DIAGNOSIS — T380X5A Adverse effect of glucocorticoids and synthetic analogues, initial encounter: Secondary | ICD-10-CM | POA: Diagnosis not present

## 2018-01-29 DIAGNOSIS — R51 Headache: Secondary | ICD-10-CM | POA: Diagnosis not present

## 2018-01-29 DIAGNOSIS — R200 Anesthesia of skin: Secondary | ICD-10-CM

## 2018-01-29 DIAGNOSIS — H547 Unspecified visual loss: Secondary | ICD-10-CM | POA: Diagnosis present

## 2018-01-29 DIAGNOSIS — Z6841 Body Mass Index (BMI) 40.0 and over, adult: Secondary | ICD-10-CM

## 2018-01-29 DIAGNOSIS — M4802 Spinal stenosis, cervical region: Secondary | ICD-10-CM | POA: Diagnosis present

## 2018-01-29 LAB — DIFFERENTIAL
Abs Immature Granulocytes: 0.01 10*3/uL (ref 0.00–0.07)
Basophils Absolute: 0.1 10*3/uL (ref 0.0–0.1)
Basophils Relative: 1 %
Eosinophils Absolute: 0.3 10*3/uL (ref 0.0–0.5)
Eosinophils Relative: 4 %
Immature Granulocytes: 0 %
Lymphocytes Relative: 34 %
Lymphs Abs: 2.1 10*3/uL (ref 0.7–4.0)
Monocytes Absolute: 0.6 10*3/uL (ref 0.1–1.0)
Monocytes Relative: 9 %
NEUTROS ABS: 3.1 10*3/uL (ref 1.7–7.7)
Neutrophils Relative %: 52 %

## 2018-01-29 LAB — URINALYSIS, ROUTINE W REFLEX MICROSCOPIC
Bilirubin Urine: NEGATIVE
Glucose, UA: NEGATIVE mg/dL
Ketones, ur: NEGATIVE mg/dL
Nitrite: NEGATIVE
PROTEIN: NEGATIVE mg/dL
Specific Gravity, Urine: 1.008 (ref 1.005–1.030)
pH: 6 (ref 5.0–8.0)

## 2018-01-29 LAB — COMPREHENSIVE METABOLIC PANEL
ALK PHOS: 59 U/L (ref 38–126)
ALT: 18 U/L (ref 0–44)
AST: 16 U/L (ref 15–41)
Albumin: 3.6 g/dL (ref 3.5–5.0)
Anion gap: 10 (ref 5–15)
BUN: 12 mg/dL (ref 6–20)
CALCIUM: 9 mg/dL (ref 8.9–10.3)
CO2: 24 mmol/L (ref 22–32)
Chloride: 104 mmol/L (ref 98–111)
Creatinine, Ser: 0.99 mg/dL (ref 0.44–1.00)
GFR calc Af Amer: >60 mL/min (ref >60)
GFR calc non Af Amer: >60 mL/min (ref >60)
GLUCOSE: 97 mg/dL (ref 70–99)
Potassium: 4.1 mmol/L (ref 3.5–5.1)
Sodium: 138 mmol/L (ref 135–145)
Total Bilirubin: 0.5 mg/dL (ref 0.3–1.2)
Total Protein: 7.2 g/dL (ref 6.5–8.1)

## 2018-01-29 LAB — CBC
HCT: 37.8 % (ref 36.0–46.0)
Hemoglobin: 12.4 g/dL (ref 12.0–15.0)
MCH: 29.2 pg (ref 26.0–34.0)
MCHC: 32.8 g/dL (ref 30.0–36.0)
MCV: 88.9 fL (ref 80.0–100.0)
Platelets: 288 10*3/uL (ref 150–400)
RBC: 4.25 MIL/uL (ref 3.87–5.11)
RDW: 12.4 % (ref 11.5–15.5)
WBC: 6.1 10*3/uL (ref 4.0–10.5)
nRBC: 0 % (ref 0.0–0.2)

## 2018-01-29 LAB — I-STAT BETA HCG BLOOD, ED (MC, WL, AP ONLY): I-stat hCG, quantitative: <5 m[IU]/mL (ref <5)

## 2018-01-29 NOTE — ED Provider Notes (Signed)
[facility] Department Provider Note MRN:  [id number]  Arrival date & time: 01/29/18     Chief Complaint   Numbness and Headache   History of Present Illness   Amanda Wise is a 32 y.o. year-old female with a history of hypertension presenting to the ED with chief complaint of numbness and headache.  Patient woke up 2 days ago and noticed that she had a numbness or decreased sensation to her right arm.  Today noticing some numbness to her left hand.  Has been having some numbness or paresthesias to her bilateral legs for several weeks.  Has a dull headache today, which is normal for her to have headaches.  Denies fever, mild left-sided neck pain today.  No trauma to the head or neck recently.  No chest pain or shortness of breath, no abdominal pain, no bowel or bladder dysfunction.  Review of Systems  A complete 10 system review of systems was obtained and all systems are negative except as noted in the HPI and PMH.   Patient's Health History    Past Medical History:  Diagnosis Date  . Anemia   . Heart palpitations   . Hypertension   . Pregnancy induced hypertension     Past Surgical History:  Procedure Laterality Date  . NO PAST SURGERIES      Family History  Problem Relation Age of Onset  . Hypertension Other   . Hypertension Maternal Grandmother   . Hypertension Paternal Grandmother     Social History   Socioeconomic History  . Marital status: Single    Spouse name: Not on file  . Number of children: Not on file  . Years of education: Not on file  . Highest education level: Not on file  Occupational History  . Not on file  Social Needs  . Financial resource strain: Not on file  . Food insecurity:    Worry: Not on file    Inability: Not on file  . Transportation needs:    Medical: Not on file    Non-medical: Not on file  Tobacco Use  . Smoking status: Never Smoker  . Smokeless tobacco: Never Used  Substance and Sexual Activity    . Alcohol use: No  . Drug use: No  . Sexual activity: Yes    Birth control/protection: None  Lifestyle  . Physical activity:    Days per week: Not on file    Minutes per session: Not on file  . Stress: Not on file  Relationships  . Social connections:    Talks on phone: Not on file    Gets together: Not on file    Attends religious service: Not on file    Active member of club or organization: Not on file    Attends meetings of clubs or organizations: Not on file    Relationship status: Not on file  . Intimate partner violence:    Fear of current or ex partner: Not on file    Emotionally abused: Not on file    Physically abused: Not on file    Forced sexual activity: Not on file  Other Topics Concern  . Not on file  Social History Narrative  . Not on file     Physical Exam  Vital Signs and Nursing Notes reviewed Vitals:   01/29/18 1937 01/29/18 2115  BP: (!) 141/92 (!) 151/94  Pulse: 87 89  Resp: 16   Temp: 98.9 F (37.2 C)   SpO2: 99% 96%  CONSTITUTIONAL: Well-appearing, NAD NEURO:  Alert and oriented x 3, subjective decreased sensation to the left arm, symmetric strength EYES:  eyes equal and reactive ENT/NECK:  no LAD, no JVD CARDIO: Regular rate, well-perfused, normal S1 and S2 PULM:  CTAB no wheezing or rhonchi GI/GU:  normal bowel sounds, non-distended, non-tender MSK/SPINE:  No gross deformities, no edema SKIN:  no rash, atraumatic PSYCH:  Appropriate speech and behavior  Diagnostic and Interventional Summary    Labs Reviewed  URINALYSIS, ROUTINE W REFLEX MICROSCOPIC - Abnormal; Notable for the following components:      Result Value   Color, Urine STRAW (*)    Hgb urine dipstick LARGE (*)    Leukocytes, UA TRACE (*)    Bacteria, UA RARE (*)    All other components within normal limits  CBC  DIFFERENTIAL  COMPREHENSIVE METABOLIC PANEL  I-STAT BETA HCG BLOOD, ED ([facility], [facility], [facility] ONLY)    MR BRAIN WO CONTRAST    (Results Pending)  MR CERVICAL  SPINE WO CONTRAST    (Results Pending)    Medications - No data to display   Procedures Critical Care  ED Course and Medical Decision Making  I have reviewed the triage vital signs and the nursing notes.  Pertinent labs & imaging results that were available during my care of the patient were reviewed by me and considered in my medical decision making (see below for details).  MRI to exclude cervical stenosis or stroke as the etiology of patient's decreased sensation.  Labs unremarkable, still awaiting MRI.  Anticipating discharge if no acute process.  Signed out to Dr. Manus Gunning at shift change.  Elmer Sow. Pilar Plate, MD [facility] Emergency Medicine [facility] [email or web address]  Final Clinical Impressions(s) / ED Diagnoses     ICD-10-CM   1. Numbness R20.0     ED Discharge Orders    None         Sabas Sous, MD 01/29/18 2324

## 2018-01-29 NOTE — ED Triage Notes (Signed)
Pt reports right hand numbness for 2 days and reports she is unable to hold things like normal or write normal with that hand. Pt also reports headache for the past few days as well. Pt also reports left eye blurriness for months. No weakness or other neuro deficits noted

## 2018-01-30 ENCOUNTER — Emergency Department (HOSPITAL_COMMUNITY): Payer: Managed Care, Other (non HMO)

## 2018-01-30 ENCOUNTER — Other Ambulatory Visit: Payer: Self-pay

## 2018-01-30 ENCOUNTER — Encounter (HOSPITAL_COMMUNITY): Payer: Self-pay | Admitting: Orthopedic Surgery

## 2018-01-30 DIAGNOSIS — Z8249 Family history of ischemic heart disease and other diseases of the circulatory system: Secondary | ICD-10-CM | POA: Diagnosis not present

## 2018-01-30 DIAGNOSIS — G35A Relapsing-remitting multiple sclerosis: Secondary | ICD-10-CM | POA: Diagnosis present

## 2018-01-30 DIAGNOSIS — M4802 Spinal stenosis, cervical region: Secondary | ICD-10-CM | POA: Diagnosis present

## 2018-01-30 DIAGNOSIS — Z6841 Body Mass Index (BMI) 40.0 and over, adult: Secondary | ICD-10-CM | POA: Diagnosis not present

## 2018-01-30 DIAGNOSIS — G35 Multiple sclerosis: Principal | ICD-10-CM

## 2018-01-30 DIAGNOSIS — R200 Anesthesia of skin: Secondary | ICD-10-CM | POA: Diagnosis not present

## 2018-01-30 DIAGNOSIS — H547 Unspecified visual loss: Secondary | ICD-10-CM | POA: Diagnosis present

## 2018-01-30 DIAGNOSIS — H5712 Ocular pain, left eye: Secondary | ICD-10-CM | POA: Diagnosis present

## 2018-01-30 DIAGNOSIS — I10 Essential (primary) hypertension: Secondary | ICD-10-CM | POA: Diagnosis present

## 2018-01-30 DIAGNOSIS — Z23 Encounter for immunization: Secondary | ICD-10-CM | POA: Diagnosis not present

## 2018-01-30 DIAGNOSIS — Z791 Long term (current) use of non-steroidal anti-inflammatories (NSAID): Secondary | ICD-10-CM | POA: Diagnosis not present

## 2018-01-30 DIAGNOSIS — G4700 Insomnia, unspecified: Secondary | ICD-10-CM | POA: Diagnosis not present

## 2018-01-30 DIAGNOSIS — R202 Paresthesia of skin: Secondary | ICD-10-CM | POA: Diagnosis present

## 2018-01-30 DIAGNOSIS — T380X5A Adverse effect of glucocorticoids and synthetic analogues, initial encounter: Secondary | ICD-10-CM | POA: Diagnosis not present

## 2018-01-30 DIAGNOSIS — R51 Headache: Secondary | ICD-10-CM | POA: Diagnosis present

## 2018-01-30 DIAGNOSIS — D72829 Elevated white blood cell count, unspecified: Secondary | ICD-10-CM | POA: Diagnosis present

## 2018-01-30 DIAGNOSIS — Z79899 Other long term (current) drug therapy: Secondary | ICD-10-CM | POA: Diagnosis not present

## 2018-01-30 LAB — GLUCOSE, CAPILLARY
Glucose-Capillary: 120 mg/dL — ABNORMAL HIGH (ref 70–99)
Glucose-Capillary: 174 mg/dL — ABNORMAL HIGH (ref 70–99)
Glucose-Capillary: 154 mg/dL — ABNORMAL HIGH (ref 70–99)
Glucose-Capillary: 122 mg/dL — ABNORMAL HIGH (ref 70–99)

## 2018-01-30 LAB — HIV ANTIBODY (ROUTINE TESTING W REFLEX): HIV Screen 4th Generation wRfx: NONREACTIVE

## 2018-01-30 MED ORDER — PANTOPRAZOLE SODIUM 40 MG PO TBEC
40.0000 mg | DELAYED_RELEASE_TABLET | Freq: Every day | ORAL | Status: DC
  Administered 2018-01-30 – 2018-02-03 (×5): 40 mg via ORAL
  Filled 2018-01-30 (×5): qty 1

## 2018-01-30 MED ORDER — ONDANSETRON HCL 4 MG/2ML IJ SOLN
4.0000 mg | Freq: Four times a day (QID) | INTRAMUSCULAR | Status: DC | PRN
  Administered 2018-01-31 – 2018-02-01 (×2): 4 mg via INTRAVENOUS
  Filled 2018-01-30 (×2): qty 2

## 2018-01-30 MED ORDER — SODIUM CHLORIDE 0.9 % IV SOLN
1000.0000 mg | Freq: Once | INTRAVENOUS | Status: AC
  Administered 2018-01-30: 1000 mg via INTRAVENOUS
  Filled 2018-01-30: qty 8

## 2018-01-30 MED ORDER — GADOBUTROL 1 MMOL/ML IV SOLN
10.0000 mL | Freq: Once | INTRAVENOUS | Status: AC | PRN
  Administered 2018-01-30: 10 mL via INTRAVENOUS

## 2018-01-30 MED ORDER — HYDRALAZINE HCL 20 MG/ML IJ SOLN: 10.0000 mg | Freq: Four times a day (QID) | INTRAMUSCULAR | Status: DC | PRN

## 2018-01-30 MED ORDER — ONDANSETRON HCL 4 MG PO TABS: 4.0000 mg | ORAL_TABLET | Freq: Four times a day (QID) | ORAL | Status: DC | PRN

## 2018-01-30 MED ORDER — IPRATROPIUM-ALBUTEROL 0.5-2.5 (3) MG/3ML IN SOLN
3.0000 mL | Freq: Four times a day (QID) | RESPIRATORY_TRACT | Status: DC
  Administered 2018-01-30: 3 mL via RESPIRATORY_TRACT
  Filled 2018-01-30: qty 3

## 2018-01-30 MED ORDER — ACETAMINOPHEN 325 MG PO TABS
650.0000 mg | ORAL_TABLET | Freq: Four times a day (QID) | ORAL | Status: DC | PRN
  Administered 2018-01-30 – 2018-02-01 (×4): 650 mg via ORAL
  Filled 2018-01-30 (×4): qty 2

## 2018-01-30 MED ORDER — INFLUENZA VAC SPLIT QUAD 0.5 ML IM SUSY
0.5000 mL | PREFILLED_SYRINGE | INTRAMUSCULAR | Status: AC
  Administered 2018-01-31: 0.5 mL via INTRAMUSCULAR
  Filled 2018-01-30: qty 0.5

## 2018-01-30 MED ORDER — IPRATROPIUM-ALBUTEROL 0.5-2.5 (3) MG/3ML IN SOLN: 3.0000 mL | Freq: Four times a day (QID) | RESPIRATORY_TRACT | Status: DC | PRN

## 2018-01-30 MED ORDER — SODIUM CHLORIDE 0.9 % IV SOLN
1000.0000 mg | Freq: Every day | INTRAVENOUS | Status: AC
  Administered 2018-01-31 – 2018-02-02 (×3): 1000 mg via INTRAVENOUS
  Filled 2018-01-30 (×4): qty 8

## 2018-01-30 MED ORDER — ENOXAPARIN SODIUM 40 MG/0.4ML ~~LOC~~ SOLN
40.0000 mg | Freq: Every day | SUBCUTANEOUS | Status: DC
  Administered 2018-02-01 – 2018-02-03 (×3): 40 mg via SUBCUTANEOUS
  Filled 2018-01-30 (×4): qty 0.4

## 2018-01-30 MED ORDER — INSULIN ASPART 100 UNIT/ML ~~LOC~~ SOLN
0.0000 [IU] | Freq: Three times a day (TID) | SUBCUTANEOUS | Status: DC
  Administered 2018-01-30 (×2): 2 [IU] via SUBCUTANEOUS
  Administered 2018-01-31 – 2018-02-03 (×7): 1 [IU] via SUBCUTANEOUS

## 2018-01-30 MED ORDER — ACETAMINOPHEN 650 MG RE SUPP: 650.0000 mg | Freq: Four times a day (QID) | RECTAL | Status: DC | PRN

## 2018-01-30 NOTE — Consult Note (Addendum)
NEURO HOSPITALIST CONSULT NOTE   Requesting physician: Dr. Manus Gunning  Reason for Consult: Probable new diagnosis of MS  History obtained from:   Patient and Chart     HPI:                                                                                                                                          Amanda Wise is an 32 y.o. female presenting with new onset of right hand numbness and weakness for the past several days. The symptoms were noticed abruptly but then continued for several days. She had an episode earlier this year of left foot numbness. MRI in the ED showed findings in the brain and spinal cord that were most consistent with MS. On detailed further interview, the patient describes intermittent bouts of neurological symptoms referable to separate localizations along the neuraxis. Her first symptoms occurred in 2012 with bilateral lower extremity weakness. She also has had symptoms of left eye pain and vision loss in the past that are suggestive of prior bouts of optic neuritis. She has no family history of MS.   MRI HEAD: 1. Severe demyelination with multiple supra-and infratentorial lesions varying from hyperacute to acute and chronic. 2. No parenchymal brain volume loss for age. 3. 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  MRI CERVICAL SPINE: 1. Spinal cord demyelination with dominant 17 mm acute enhancing lesion at C6-7. No myelomalacia. 2. Mild canal stenosis C5-6.  Past Medical History:  Diagnosis Date  . Anemia   . Heart palpitations   . Hypertension   . Pregnancy induced hypertension     Past Surgical History:  Procedure Laterality Date  . NO PAST SURGERIES      Family History  Problem Relation Age of Onset  . Hypertension Other   . Hypertension Maternal Grandmother   . Hypertension Paternal Grandmother               Social History:  reports that she has never smoked. She has never used smokeless  tobacco. She reports that she does not drink alcohol or use drugs.  No Known Allergies  HOME MEDICATIONS:  ROS:                                                                                                                                       As per HPI. Does not endorse additional symptoms.    Blood pressure 104/84, pulse 82, temperature 98.9 F (37.2 C), temperature source Oral, resp. rate 16, SpO2 99 %, unknown if currently breastfeeding.   General Examination:                                                                                                       Physical Exam  General: Morbidly obese HEENT-  /AT  Lungs- Respirations unlabored Extremities- No edema  Neurological Examination Mental Status: Alert, oriented, thought content appropriate.  Speech fluent without evidence of aphasia.  Able to follow all commands without difficulty. Cranial Nerves: II:  Visual fields intact bilaterally. PERRL III,IV, VI: EOMI. No nystagmus. No ptosis.  V,VII: No facial droop. Facial temp sensation equal bilaterally VIII: hearing intact to voice IX,X: No hypophonia XI: Symmetric XII: Midline tongue extension Motor: RUE: 4/5 grip, 4/5 triceps, 4+/5 deltoid, otherwise 5/5 LUE: 5/5 RLE: 5/5 LLE: 5/5 Tone normal. Bulk normal.  Sensory: Temp sensation decreased to RUE and RLE. FT sensation also decreased on the right.  Deep Tendon Reflexes: 2+ bilateral biceps and brachioradialis. 3+ right patella, 2+ left patella. 4+ right achilles, 2+ left achilles.  Cerebellar: No ataxia with FNF bilaterally Gait: Deferred   Lab Results: Basic Metabolic Panel: Recent Labs  Lab 01/29/18 1941  NA 138  K 4.1  CL 104  CO2 24  GLUCOSE 97  BUN 12  CREATININE 0.99  CALCIUM 9.0    CBC: Recent Labs  Lab 01/29/18 1941  WBC 6.1  NEUTROABS 3.1  HGB 12.4  HCT 37.8    MCV 88.9  PLT 288    Cardiac Enzymes: No results for input(s): CKTOTAL, CKMB, CKMBINDEX, TROPONINI in the last 168 hours.  Lipid Panel: No results for input(s): CHOL, TRIG, HDL, CHOLHDL, VLDL, LDLCALC in the last 168 hours.  Imaging: Mr Laqueta Jean And Wo Contrast  Result Date: 01/30/2018 CLINICAL DATA:  Two days of RIGHT arm numbness now in LEFT hand. Lower extremity paresthesias for several weeks. Assess stroke versus demyelination. History of hypertension. EXAM: MRI HEAD WITHOUT AND WITH CONTRAST MRI CERVICAL SPINE WITHOUT AND WITH CONTRAST TECHNIQUE: Multiplanar, multiecho pulse sequences of the brain and surrounding structures, and cervical spine, to include the craniocervical junction and cervicothoracic junction, were  obtained without and with intravenous contrast. CONTRAST:  10 cc Gadavist COMPARISON:  None. FINDINGS: MRI HEAD FINDINGS INTRACRANIAL CONTENTS: Punctate reduced diffusion LEFT periatrial white matter with low ADC values favoring hyperacute demyelination. Greater then 10 supratentorial white matter lesions with low T1 signal and T2 shine through, many of which radiate from the periventricular margin. Dominant lesion RIGHT frontal lobe measuring 11 mm. Juxtacortical and bifrontal cortical lesions. At least 6 infratentorial lesions. Faint enhancement of RIGHT pontine, LEFT periatrial, RIGHT parietal, RIGHT frontal juxta cortical lesion. No susceptibility artifact to suggest hemorrhage. The ventricles and sulci are normal for patient's age. No masses or mass effect. No abnormal intraparenchymal or extra-axial enhancement. No abnormal extra-axial fluid collections. No extra-axial masses. 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 VASCULAR: Normal major intracranial vascular flow voids present at skull base. SKULL AND UPPER CERVICAL SPINE: No abnormal sellar expansion. No suspicious calvarial bone marrow signal. Craniocervical junction maintained.  SINUSES/ORBITS: Mild paranasal sinus mucosal thickening, lobulated maxillary component. Mastoid air cells are well aerated.The included ocular globes and orbital contents are non-suspicious. OTHER: None. MRI CERVICAL SPINE FINDINGS ALIGNMENT: Straightened cervical lordosis.  No malalignment. VERTEBRAE/DISCS: Vertebral bodies are intact. Intervertebral disc morphology's and signal are normal. No acute or abnormal bone marrow signal. No abnormal osseous or disc enhancement. CORD:At least 4 spinal cord lesions identified, dominant expansile lesion is 17 mm in craniocaudad dimension at C6-7 within RIGHT hemicord with enhancement. Subcentimeter lesions at C2-3 with faint enhancement. No myelomalacia or syrinx. POSTERIOR FOSSA, VERTEBRAL ARTERIES, PARASPINAL TISSUES: No MR findings of ligamentous injury. Vertebral artery flow voids present. Included posterior fossa and paraspinal soft tissues are normal. DISC LEVELS: C2-3 through C4-5: No disc bulge, canal stenosis nor neural foraminal narrowing. C5-6: Small central disc protrusion. Mild canal stenosis without neural foraminal narrowing. C6-7 and C7-T1: No disc bulge, canal stenosis nor neural foraminal narrowing. IMPRESSION: MRI HEAD: 1. Severe demyelination with multiple supra-and infratentorial lesions varying from hyperacute to acute and chronic. 2. No parenchymal brain volume loss for age. 3. 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 MRI CERVICAL SPINE: 1. Spinal cord demyelination with dominant 17 mm acute enhancing lesion at C6-7. No myelomalacia. 2. Mild canal stenosis C5-6. Acute findings discussed with and reconfirmed by Dr.STEPHEN RANCOUR on 01/30/2018 at 2:33 am. Electronically Signed   By: Awilda Metroourtnay  Bloomer M.D.   On: 01/30/2018 02:33   Mr Cervical Spine W Wo Contrast  Result Date: 01/30/2018 CLINICAL DATA:  Two days of RIGHT arm numbness now in LEFT hand. Lower extremity paresthesias for several weeks. Assess stroke versus  demyelination. History of hypertension. EXAM: MRI HEAD WITHOUT AND WITH CONTRAST MRI CERVICAL SPINE WITHOUT AND WITH CONTRAST TECHNIQUE: Multiplanar, multiecho pulse sequences of the brain and surrounding structures, and cervical spine, to include the craniocervical junction and cervicothoracic junction, were obtained without and with intravenous contrast. CONTRAST:  10 cc Gadavist COMPARISON:  None. FINDINGS: MRI HEAD FINDINGS INTRACRANIAL CONTENTS: Punctate reduced diffusion LEFT periatrial white matter with low ADC values favoring hyperacute demyelination. Greater then 10 supratentorial white matter lesions with low T1 signal and T2 shine through, many of which radiate from the periventricular margin. Dominant lesion RIGHT frontal lobe measuring 11 mm. Juxtacortical and bifrontal cortical lesions. At least 6 infratentorial lesions. Faint enhancement of RIGHT pontine, LEFT periatrial, RIGHT parietal, RIGHT frontal juxta cortical lesion. No susceptibility artifact to suggest hemorrhage. The ventricles and sulci are normal for patient's age. No masses or mass effect. No abnormal intraparenchymal or extra-axial enhancement. No abnormal extra-axial  fluid collections. No extra-axial masses. 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 VASCULAR: Normal major intracranial vascular flow voids present at skull base. SKULL AND UPPER CERVICAL SPINE: No abnormal sellar expansion. No suspicious calvarial bone marrow signal. Craniocervical junction maintained. SINUSES/ORBITS: Mild paranasal sinus mucosal thickening, lobulated maxillary component. Mastoid air cells are well aerated.The included ocular globes and orbital contents are non-suspicious. OTHER: None. MRI CERVICAL SPINE FINDINGS ALIGNMENT: Straightened cervical lordosis.  No malalignment. VERTEBRAE/DISCS: Vertebral bodies are intact. Intervertebral disc morphology's and signal are normal. No acute or abnormal bone marrow signal. No abnormal  osseous or disc enhancement. CORD:At least 4 spinal cord lesions identified, dominant expansile lesion is 17 mm in craniocaudad dimension at C6-7 within RIGHT hemicord with enhancement. Subcentimeter lesions at C2-3 with faint enhancement. No myelomalacia or syrinx. POSTERIOR FOSSA, VERTEBRAL ARTERIES, PARASPINAL TISSUES: No MR findings of ligamentous injury. Vertebral artery flow voids present. Included posterior fossa and paraspinal soft tissues are normal. DISC LEVELS: C2-3 through C4-5: No disc bulge, canal stenosis nor neural foraminal narrowing. C5-6: Small central disc protrusion. Mild canal stenosis without neural foraminal narrowing. C6-7 and C7-T1: No disc bulge, canal stenosis nor neural foraminal narrowing. IMPRESSION: MRI HEAD: 1. Severe demyelination with multiple supra-and infratentorial lesions varying from hyperacute to acute and chronic. 2. No parenchymal brain volume loss for age. 3. 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 MRI CERVICAL SPINE: 1. Spinal cord demyelination with dominant 17 mm acute enhancing lesion at C6-7. No myelomalacia. 2. Mild canal stenosis C5-6. Acute findings discussed with and reconfirmed by Dr.STEPHEN RANCOUR on 01/30/2018 at 2:33 am. Electronically Signed   By: Awilda Metro M.D.   On: 01/30/2018 02:33    Assessment: 32 year old female with recent onset of multifocal neurological signs and symptoms 1. MRI of brain and cervical spine reveals multiple supratentorial, infratentorial and cervical spinal cord lesions. The distribution, morphologies and signal characteristics are most consistent with multiple sclerosis. Acute enhancing lesions are seen within the cervical spinal cord.  2. Exam reveals RUE weakness and right sided mild sensory deficit 3. History since 2012 of multiple episodes of Neurological deficit that each resolved over time, also consistent with MS.   Recommendations: 1. IV Solumedrol 1000 mg qd x 5 days. 2. Monitor  CBG closely while on pulsed dose steroids 3. Outpatient follow up with Dr. Epimenio Foot in 2-4 weeks' time. 4. LP can be performed as an outpatient.  5. Patient educated regarding MS and disease-modifying treatment, which significantly lowers the rate of progression of MS and number of relapses on average.     Electronically signed: Dr. Caryl Pina 01/30/2018, 3:15 AM

## 2018-01-30 NOTE — Evaluation (Signed)
Physical Therapy Evaluation Patient Details Name: Amanda Wise MRN: [id number] DOB: April 12, 1985 Today's Date: 01/30/2018   History of Present Illness  Amanda Wise is a 32 y.o. female with medical history significant of HTN. Pt has been experiencing decreased sensation to R arm and L hand.  Has been having paresthesias / numbness to B legs for several weeks. 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  Clinical Impression  Patient demonstrated independence with all gait and mobility.  No loss of balance noted during gait.  Patient score 23/24 on Dynamic gait index.  No further PT needs identified at this time.  Patient may need OPPT in the future depending on the progression of her MS.    Follow Up Recommendations No PT follow up    Equipment Recommendations  None recommended by PT    Recommendations for Other Services       Precautions / Restrictions Precautions Precautions: None Restrictions Weight Bearing Restrictions: No      Mobility  Bed Mobility Overal bed mobility: Independent             General bed mobility comments: no assist, bed flat, no use of rails  Transfers Overall transfer level: Independent                  Ambulation/Gait Ambulation/Gait assistance: Independent Gait Distance (Feet): 150 Feet Assistive device: None Gait Pattern/deviations: WFL(Within Functional Limits)        Stairs            Wheelchair Mobility    Modified Rankin (Stroke Patients Only)       Balance                                 Standardized Balance Assessment Standardized Balance Assessment : Dynamic Gait Index   Dynamic Gait Index Level Surface: Normal Change in Gait Speed: Mild Impairment Gait with Horizontal Head Turns: Normal Gait with Vertical Head Turns: Normal Gait and Pivot Turn: Normal Step Over Obstacle: Normal Step Around Obstacles: Normal Steps:  Normal Total Score: 23       Pertinent Vitals/Pain Pain Assessment: No/denies pain    Home Living                        Prior Function                 Hand Dominance        Extremity/Trunk Assessment        Lower Extremity Assessment Lower Extremity Assessment: Overall WFL for tasks assessed       Communication      Cognition Arousal/Alertness: Awake/alert Behavior During Therapy: WFL for tasks assessed/performed Overall Cognitive Status: Within Functional Limits for tasks assessed                                        General Comments      Exercises     Assessment/Plan    PT Assessment Patent does not need any further PT services  PT Problem List         PT Treatment Interventions      PT Goals (Current goals can be found in the Care Plan section)  Acute Rehab PT Goals PT Goal Formulation: All assessment and education  complete, DC therapy    Frequency     Barriers to discharge        Co-evaluation               AM-PAC PT "6 Clicks" Mobility  Outcome Measure Help needed turning from your back to your side while in a flat bed without using bedrails?: None Help needed moving from lying on your back to sitting on the side of a flat bed without using bedrails?: None Help needed moving to and from a bed to a chair (including a wheelchair)?: None Help needed standing up from a chair using your arms (e.g., wheelchair or bedside chair)?: None Help needed to walk in hospital room?: None Help needed climbing 3-5 steps with a railing? : None 6 Click Score: 24    End of Session   Activity Tolerance: Patient tolerated treatment well Patient left: in bed;with call bell/phone within reach   PT Visit Diagnosis: Unsteadiness on feet (R26.81)    Time: [id number] PT Time Calculation (min) (ACUTE ONLY): 8 min   Charges:   PT Evaluation $PT Eval Low Complexity: 1 Low          01/30/2018 Corlis HoveMargie Killian Schwer,  PT Acute Rehabilitation Services Pager:  [phone] Office:  [phone]    Olivia CanterMoton, Arie Gable M 01/30/2018, 3:16 PM

## 2018-01-30 NOTE — Progress Notes (Signed)
Patient was medicated for co having a mild headache.  The patient has tolerated up activity well and reports that numbness has subsided at this time

## 2018-01-30 NOTE — ED Provider Notes (Signed)
Care assumed from Dr. Pilar Plate.  Patient with right arm numbness and tingling and some numbness in her left hand today as well.  Awaiting MRI for further evaluation.  Discussed with Dr. Karie Kirks of radiology.  MRIs concerning for advanced multiple sclerosis with active demyelination.  D/w Dr. Otelia Limes of neurology.  He recommends 1 g of IV Solu-Medrol daily for 5 days.  He will see patient.  Recommends hospitalist admission.  D/w Dr. Julian Reil. Patient updated.    Glynn Octave, MD 01/30/18 [phone]

## 2018-01-30 NOTE — Evaluation (Addendum)
Occupational Therapy Evaluation Patient Details Name: Amanda Wise MRN: [id number] DOB: 07-20-1985 Today's Date: 01/30/2018    History of Present Illness Amanda Wise is a 32 y.o. female with medical history significant of HTN. Pt has been experiencing decreased sensation to R arm and L hand.  Has been having paresthesias / numbness to B legs for several weeks. 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   Clinical Impression   PTA Pt independent in ADL and mobility. Pt has 2 young kids, works at a call center, and was going to the gym (weight loss journey). Pt is currently supervision for ADL in standing and sitting - R hand still experiencing sensory deficits and slight decreased coordination. Strength for grasp was 4/5 and able to complete functional tasks at sink level. Pt does report that she feels "woozy" when walking - no overt LOB throughout session. Next session to plan on bringing in task items like buttons/snaps for focus on fine motor with strength (putty?)- especially with young children.   Also provided education on MS society and support groups (stressed importance of support for mental and physical health) - as well as reinforcing that while this will be a factor in her life - this does not DEFINE her. She has a positive attitude about everything so far.     Follow Up Recommendations  No OT follow up    Equipment Recommendations  None recommended by OT    Recommendations for Other Services Other (comment)(Follow up and get involved in support group)     Precautions / Restrictions Restrictions Weight Bearing Restrictions: No      Mobility Bed Mobility Overal bed mobility: Independent             General bed mobility comments: no assist, bed flat, no use of rails  Transfers Overall transfer level: Modified independent Equipment used: None             General transfer comment: no assist  needed, mod I for slightly increased time    Balance Overall balance assessment: Modified Independent                                         ADL either performed or assessed with clinical judgement   ADL Overall ADL's : Needs assistance/impaired Eating/Feeding: Independent   Grooming: Wash/dry hands;Wash/dry face;Oral care;Supervision/safety;Standing Grooming Details (indicate cue type and reason): sink level Upper Body Bathing: Supervision/ safety;Standing Upper Body Bathing Details (indicate cue type and reason): educated on fatigue management Lower Body Bathing: Supervison/ safety;Sit to/from stand   Upper Body Dressing : Supervision/safety;Standing Upper Body Dressing Details (indicate cue type and reason): to don hospital gown Lower Body Dressing: Supervision/safety;Sit to/from stand Lower Body Dressing Details (indicate cue type and reason): able to access LB for donning socks without assist Toilet Transfer: Supervision/safety;Ambulation Toilet Transfer Details (indicate cue type and reason): Pt reports feeling "more wobbly" than normal Toileting- Clothing Manipulation and Hygiene: Independent;Sit to/from stand   Tub/ Shower Transfer: Supervision/safety;Ambulation   Functional mobility during ADLs: Supervision/safety General ADL Comments: educated on energy conservation (provided handout) for ADL - Pt with no LOB or balance deficits noted throughout session but she self reports feeling "wobbly"     Vision Patient Visual Report: No change from baseline       Perception     Praxis  Pertinent Vitals/Pain Pain Assessment: No/denies pain     Hand Dominance Right   Extremity/Trunk Assessment Upper Extremity Assessment Upper Extremity Assessment: RUE deficits/detail RUE Deficits / Details: decreased sensation (dull - like she's got on a glove) slight motor discoordination, but functional for ADL tasks RUE Sensation: decreased light touch RUE  Coordination: decreased fine motor   Lower Extremity Assessment Lower Extremity Assessment: Defer to PT evaluation       Communication Communication Communication: No difficulties   Cognition Arousal/Alertness: Awake/alert Behavior During Therapy: WFL for tasks assessed/performed Overall Cognitive Status: Within Functional Limits for tasks assessed                                     General Comments  Provided edcuation about importance of support groups, getting good information through reliable sources (MS society as example of good resource).    Exercises     Shoulder Instructions      Home Living Family/patient expects to be discharged to:: Private residence Living Arrangements: Spouse/significant other;Children(2, 3) Available Help at Discharge: Family;Available 24 hours/day Type of Home: Apartment Home Access: Level entry     Home Layout: One level     Bathroom Shower/Tub: Tub/shower unit;Curtain   FirefighterBathroom Toilet: Standard Bathroom Accessibility: Yes How Accessible: Accessible via walker Home Equipment: None          Prior Functioning/Environment Level of Independence: Independent        Comments: been having symptoms for several years involving BUE and BLE, works at [employer] currently, also has weight loss goals, thought about PTA school        OT Problem List: Decreased strength;Decreased activity tolerance;Decreased coordination;Impaired sensation;Obesity;Impaired UE functional use      OT Treatment/Interventions: Therapeutic exercise;Neuromuscular education;Energy conservation;Therapeutic activities;Patient/family education;Balance training    OT Goals(Current goals can be found in the care plan section) Acute Rehab OT Goals Patient Stated Goal: "to be independent and stay independent for my kids" OT Goal Formulation: With patient Time For Goal Achievement: 02/13/18 Potential to Achieve Goals: Good ADL Goals Pt  Will Transfer to Toilet: Independently;ambulating Pt Will Perform Toileting - Clothing Manipulation and hygiene: Independently;sit to/from stand Pt Will Perform Tub/Shower Transfer: with modified independence;ambulating;Tub transfer Additional ADL Goal #1: Pt will recall 3 ways of conserving energy during ADL routine with no cues/prompts  OT Frequency: Min 2X/week   Barriers to D/C:            Co-evaluation              AM-PAC OT "6 Clicks" Daily Activity     Outcome Measure Help from another person eating meals?: None Help from another person taking care of personal grooming?: A Little Help from another person toileting, which includes using toliet, bedpan, or urinal?: None Help from another person bathing (including washing, rinsing, drying)?: None Help from another person to put on and taking off regular upper body clothing?: None Help from another person to put on and taking off regular lower body clothing?: None 6 Click Score: 23   End of Session Equipment Utilized During Treatment: Gait belt Nurse Communication: Mobility status;Other (comment)(both RN and NT aware that Pt is in bathroom washing up)  Activity Tolerance: Patient tolerated treatment well Patient left: Other (comment)(in bathroom washing up at sink - able to pull call bell)  OT Visit Diagnosis: Muscle weakness (generalized) (M62.81);Other symptoms and signs involving the nervous system (R29.898)  Time: 1101-1136 OT Time Calculation (min): 35 min Charges:  OT General Charges $OT Visit: 1 Visit OT Evaluation $OT Eval Low Complexity: 1 Low OT Treatments $Self Care/Home Management : 8-22 mins  Sherryl Manges OTR/L Acute Rehabilitation Services Pager: [phone] Office: [phone]  Evern Bio Dosha Broshears 01/30/2018, 11:54 AM

## 2018-01-30 NOTE — Progress Notes (Signed)
Pt states that her grip in right hand has improved and she only has tingling in her left thumb. States she noticed improvement since receiving solumedrol.

## 2018-01-30 NOTE — ED Notes (Signed)
Patient transported to MRI 

## 2018-01-30 NOTE — H&P (Signed)
History and Physical    Basilia Stuckert ZOX:[id number] DOB: Jan 13, 1986 DOA: 01/29/2018  PCP: Patient, No Pcp Per  Patient coming from: Home  I have personally briefly reviewed patient's old medical records in [facility]  Chief Complaint: Numbness, headache  HPI: Amanda Wise is a 32 y.o. female with medical history significant of HTN.  2 days ago when patient woke up she noticed decreased sensation to R arm.  Today numbness to L hand.  Has been having paresthesias / numbness to B legs for several weeks.  No head or neck trauma recently, no abd pain, no CP, no SOB.   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   Review of Systems: As per HPI otherwise 10 point review of systems negative.   Past Medical History:  Diagnosis Date  . Anemia   . Heart palpitations   . Hypertension   . Pregnancy induced hypertension     Past Surgical History:  Procedure Laterality Date  . NO PAST SURGERIES       reports that she has never smoked. She has never used smokeless tobacco. She reports that she does not drink alcohol or use drugs.  No Known Allergies  Family History  Problem Relation Age of Onset  . Hypertension Other   . Hypertension Maternal Grandmother   . Hypertension Paternal Grandmother      Prior to Admission medications   Medication Sig Start Date End Date Taking? Authorizing Provider  Aspirin-Acetaminophen-Caffeine (GOODY HEADACHE PO) Take 1 packet by mouth 2 (two) times daily as needed (pain/headache).   Yes [provider]  ibuprofen (ADVIL,MOTRIN) 200 MG tablet Take 200 mg by mouth every 6 (six) hours as needed for headache (pain).   Yes [provider]  MAGNESIUM PO Take 3 tablets by mouth daily after breakfast.   Yes [provider]  OVER THE COUNTER MEDICATION Take 3 tablets by mouth daily before breakfast. Fat Burner from [facility] [employer]   Yes  [provider]  ibuprofen (ADVIL,MOTRIN) 600 MG tablet Take 1 tablet (600 mg total) by mouth every 6 (six) hours. Patient not taking: Reported on 08/17/2017 08/08/16   Allie Bossier, MD  ibuprofen (ADVIL,MOTRIN) 600 MG tablet Take 1 tablet (600 mg total) by mouth every 8 (eight) hours as needed. Take with food. Patient not taking: Reported on 01/29/2018 08/17/17   Cathren Laine, MD  methocarbamol (ROBAXIN) 500 MG tablet Take 2 tablets (1,000 mg total) by mouth 3 (three) times daily as needed for muscle spasms. Patient not taking: Reported on 01/29/2018 08/17/17   Cathren Laine, MD    Physical Exam: Vitals:   01/30/18 0254 01/30/18 0256 01/30/18 0300 01/30/18 0345  BP: 104/84  112/63 132/84  Pulse:  82 86   Resp:      Temp:      TempSrc:      SpO2:  99% 95%     Constitutional: NAD, calm, comfortable Eyes: PERRL, lids and conjunctivae normal ENMT: Mucous membranes are moist. Posterior pharynx clear of any exudate or lesions.Normal dentition.  Neck: normal, supple, no masses, no thyromegaly Respiratory: clear to auscultation bilaterally, no wheezing, no crackles. Normal respiratory effort. No accessory muscle use.  Cardiovascular: Regular rate and rhythm, no murmurs / rubs / gallops. No extremity edema. 2+ pedal pulses. No carotid bruits.  Abdomen: no tenderness, no masses palpated. No hepatosplenomegaly. Bowel sounds positive.  Musculoskeletal: no clubbing / cyanosis. No joint deformity  upper and lower extremities. Good ROM, no contractures. Normal muscle tone.  Skin: no rashes, lesions, ulcers. No induration Neurologic: Decreased sensation L arm Psychiatric: Normal judgment and insight. Alert and oriented x 3. Normal mood.    Labs on Admission: I have personally reviewed following labs and imaging studies  CBC: Recent Labs  Lab 01/29/18 1941  WBC 6.1  NEUTROABS 3.1  HGB 12.4  HCT 37.8  MCV 88.9  PLT 288   Basic Metabolic Panel: Recent Labs  Lab 01/29/18 1941  NA  138  K 4.1  CL 104  CO2 24  GLUCOSE 97  BUN 12  CREATININE 0.99  CALCIUM 9.0   GFR: CrCl cannot be calculated (Unknown ideal weight.). Liver Function Tests: Recent Labs  Lab 01/29/18 1941  AST 16  ALT 18  ALKPHOS 59  BILITOT 0.5  PROT 7.2  ALBUMIN 3.6   No results for input(s): LIPASE, AMYLASE in the last 168 hours. No results for input(s): AMMONIA in the last 168 hours. Coagulation Profile: No results for input(s): INR, PROTIME in the last 168 hours. Cardiac Enzymes: No results for input(s): CKTOTAL, CKMB, CKMBINDEX, TROPONINI in the last 168 hours. BNP (last 3 results) No results for input(s): PROBNP in the last 8760 hours. HbA1C: No results for input(s): HGBA1C in the last 72 hours. CBG: No results for input(s): GLUCAP in the last 168 hours. Lipid Profile: No results for input(s): CHOL, HDL, LDLCALC, TRIG, CHOLHDL, LDLDIRECT in the last 72 hours. Thyroid Function Tests: No results for input(s): TSH, T4TOTAL, FREET4, T3FREE, THYROIDAB in the last 72 hours. Anemia Panel: No results for input(s): VITAMINB12, FOLATE, FERRITIN, TIBC, IRON, RETICCTPCT in the last 72 hours. Urine analysis:    Component Value Date/Time   COLORURINE STRAW (A) 01/29/2018 2251   APPEARANCEUR CLEAR 01/29/2018 2251   LABSPEC 1.008 01/29/2018 2251   PHURINE 6.0 01/29/2018 2251   GLUCOSEU NEGATIVE 01/29/2018 2251   HGBUR LARGE (A) 01/29/2018 2251   BILIRUBINUR NEGATIVE 01/29/2018 2251   KETONESUR NEGATIVE 01/29/2018 2251   PROTEINUR NEGATIVE 01/29/2018 2251   UROBILINOGEN 1.0 02/13/2014 0551   NITRITE NEGATIVE 01/29/2018 2251   LEUKOCYTESUR TRACE (A) 01/29/2018 2251    Radiological Exams on Admission: Mr Laqueta JeanBrain W And Wo Contrast  Result Date: 01/30/2018 CLINICAL DATA:  Two days of RIGHT arm numbness now in LEFT hand. Lower extremity paresthesias for several weeks. Assess stroke versus demyelination. History of hypertension. EXAM: MRI HEAD WITHOUT AND WITH CONTRAST MRI CERVICAL SPINE  WITHOUT AND WITH CONTRAST TECHNIQUE: Multiplanar, multiecho pulse sequences of the brain and surrounding structures, and cervical spine, to include the craniocervical junction and cervicothoracic junction, were obtained without and with intravenous contrast. CONTRAST:  10 cc Gadavist COMPARISON:  None. FINDINGS: MRI HEAD FINDINGS INTRACRANIAL CONTENTS: Punctate reduced diffusion LEFT periatrial white matter with low ADC values favoring hyperacute demyelination. Greater then 10 supratentorial white matter lesions with low T1 signal and T2 shine through, many of which radiate from the periventricular margin. Dominant lesion RIGHT frontal lobe measuring 11 mm. Juxtacortical and bifrontal cortical lesions. At least 6 infratentorial lesions. Faint enhancement of RIGHT pontine, LEFT periatrial, RIGHT parietal, RIGHT frontal juxta cortical lesion. No susceptibility artifact to suggest hemorrhage. The ventricles and sulci are normal for patient's age. No masses or mass effect. No abnormal intraparenchymal or extra-axial enhancement. No abnormal extra-axial fluid collections. No extra-axial masses. 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 VASCULAR: Normal major intracranial vascular flow voids present at skull base. SKULL AND UPPER CERVICAL SPINE: No  abnormal sellar expansion. No suspicious calvarial bone marrow signal. Craniocervical junction maintained. SINUSES/ORBITS: Mild paranasal sinus mucosal thickening, lobulated maxillary component. Mastoid air cells are well aerated.The included ocular globes and orbital contents are non-suspicious. OTHER: None. MRI CERVICAL SPINE FINDINGS ALIGNMENT: Straightened cervical lordosis.  No malalignment. VERTEBRAE/DISCS: Vertebral bodies are intact. Intervertebral disc morphology's and signal are normal. No acute or abnormal bone marrow signal. No abnormal osseous or disc enhancement. CORD:At least 4 spinal cord lesions identified, dominant expansile lesion  is 17 mm in craniocaudad dimension at C6-7 within RIGHT hemicord with enhancement. Subcentimeter lesions at C2-3 with faint enhancement. No myelomalacia or syrinx. POSTERIOR FOSSA, VERTEBRAL ARTERIES, PARASPINAL TISSUES: No MR findings of ligamentous injury. Vertebral artery flow voids present. Included posterior fossa and paraspinal soft tissues are normal. DISC LEVELS: C2-3 through C4-5: No disc bulge, canal stenosis nor neural foraminal narrowing. C5-6: Small central disc protrusion. Mild canal stenosis without neural foraminal narrowing. C6-7 and C7-T1: No disc bulge, canal stenosis nor neural foraminal narrowing. IMPRESSION: MRI HEAD: 1. Severe demyelination with multiple supra-and infratentorial lesions varying from hyperacute to acute and chronic. 2. No parenchymal brain volume loss for age. 3. 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 MRI CERVICAL SPINE: 1. Spinal cord demyelination with dominant 17 mm acute enhancing lesion at C6-7. No myelomalacia. 2. Mild canal stenosis C5-6. Acute findings discussed with and reconfirmed by Dr.STEPHEN RANCOUR on 01/30/2018 at 2:33 am. Electronically Signed   By: Awilda Metro M.D.   On: 01/30/2018 02:33   Mr Cervical Spine W Wo Contrast  Result Date: 01/30/2018 CLINICAL DATA:  Two days of RIGHT arm numbness now in LEFT hand. Lower extremity paresthesias for several weeks. Assess stroke versus demyelination. History of hypertension. EXAM: MRI HEAD WITHOUT AND WITH CONTRAST MRI CERVICAL SPINE WITHOUT AND WITH CONTRAST TECHNIQUE: Multiplanar, multiecho pulse sequences of the brain and surrounding structures, and cervical spine, to include the craniocervical junction and cervicothoracic junction, were obtained without and with intravenous contrast. CONTRAST:  10 cc Gadavist COMPARISON:  None. FINDINGS: MRI HEAD FINDINGS INTRACRANIAL CONTENTS: Punctate reduced diffusion LEFT periatrial white matter with low ADC values favoring hyperacute  demyelination. Greater then 10 supratentorial white matter lesions with low T1 signal and T2 shine through, many of which radiate from the periventricular margin. Dominant lesion RIGHT frontal lobe measuring 11 mm. Juxtacortical and bifrontal cortical lesions. At least 6 infratentorial lesions. Faint enhancement of RIGHT pontine, LEFT periatrial, RIGHT parietal, RIGHT frontal juxta cortical lesion. No susceptibility artifact to suggest hemorrhage. The ventricles and sulci are normal for patient's age. No masses or mass effect. No abnormal intraparenchymal or extra-axial enhancement. No abnormal extra-axial fluid collections. No extra-axial masses. 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 VASCULAR: Normal major intracranial vascular flow voids present at skull base. SKULL AND UPPER CERVICAL SPINE: No abnormal sellar expansion. No suspicious calvarial bone marrow signal. Craniocervical junction maintained. SINUSES/ORBITS: Mild paranasal sinus mucosal thickening, lobulated maxillary component. Mastoid air cells are well aerated.The included ocular globes and orbital contents are non-suspicious. OTHER: None. MRI CERVICAL SPINE FINDINGS ALIGNMENT: Straightened cervical lordosis.  No malalignment. VERTEBRAE/DISCS: Vertebral bodies are intact. Intervertebral disc morphology's and signal are normal. No acute or abnormal bone marrow signal. No abnormal osseous or disc enhancement. CORD:At least 4 spinal cord lesions identified, dominant expansile lesion is 17 mm in craniocaudad dimension at C6-7 within RIGHT hemicord with enhancement. Subcentimeter lesions at C2-3 with faint enhancement. No myelomalacia or syrinx. POSTERIOR FOSSA, VERTEBRAL ARTERIES, PARASPINAL TISSUES: No MR findings of ligamentous  injury. Vertebral artery flow voids present. Included posterior fossa and paraspinal soft tissues are normal. DISC LEVELS: C2-3 through C4-5: No disc bulge, canal stenosis nor neural foraminal  narrowing. C5-6: Small central disc protrusion. Mild canal stenosis without neural foraminal narrowing. C6-7 and C7-T1: No disc bulge, canal stenosis nor neural foraminal narrowing. IMPRESSION: MRI HEAD: 1. Severe demyelination with multiple supra-and infratentorial lesions varying from hyperacute to acute and chronic. 2. No parenchymal brain volume loss for age. 3. 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 MRI CERVICAL SPINE: 1. Spinal cord demyelination with dominant 17 mm acute enhancing lesion at C6-7. No myelomalacia. 2. Mild canal stenosis C5-6. Acute findings discussed with and reconfirmed by Dr.STEPHEN RANCOUR on 01/30/2018 at 2:33 am. Electronically Signed   By: Awilda Metro M.D.   On: 01/30/2018 02:33    EKG: Independently reviewed.  Assessment/Plan Active Problems:   Multiple sclerosis (HCC)    1. MS - new diagnosis of demyelinating disease most likely MS 1. Neurology consult 2. 1g solumedrol daily for 5 days per their rec 3. Will check CBGs while on solumedrol 4. BMP tomorrow AM 5. protonix while on solumedrol  DVT prophylaxis: Lovenox Code Status: Full Family Communication: No family inroom Disposition Plan: Home after admit Consults called: Neurology Admission status: Admit to inpatient  Severity of Illness: The appropriate patient status for this patient is INPATIENT. Inpatient status is judged to be reasonable and necessary in order to provide the required intensity of service to ensure the patient's safety. The patient's presenting symptoms, physical exam findings, and initial radiographic and laboratory data in the context of their chronic comorbidities is felt to place them at high risk for further clinical deterioration. Furthermore, it is not anticipated that the patient will be medically stable for discharge from the hospital within 2 midnights of admission. The following factors support the patient status of inpatient.   " The patient's  presenting symptoms include Numbness. " The worrisome physical exam findings include diminished sensation L arm. " The initial radiographic and laboratory data are worrisome because of advanced MS with active demyelination on MRI.    * I certify that at the point of admission it is my clinical judgment that the patient will require inpatient hospital care spanning beyond 2 midnights from the point of admission due to high intensity of service, high risk for further deterioration and high frequency of surveillance required.Hillary Bow DO [facility] Pager [phone] Only works nights!  If 7AM-7PM, please contact the primary day team physician taking care of patient  [email or web address] Password [email or web address]  01/30/2018, 4:04 AM

## 2018-01-30 NOTE — Progress Notes (Signed)
PROGRESS NOTE        PATIENT DETAILS Name: Amanda Wise Age: 32 y.o. Sex: female Date of Birth: Dec 17, 1985 Admit Date: 01/29/2018 Admitting Physician Hillary Bow, DO NWG:[id number], No Pcp Per  Brief Narrative: Patient is a 32 y.o. female with no significant past medical history-presented with decreased sensation to right hand-and paresthesias of all of her extremities.  Upon further evaluation with neuroimaging-found to have multiple sclerosis-started on high-dose steroids and admitted to the hospitalist service.  Subjective: Much better-now able to make a fist with her right hand-she was not able to do this yesterday.  Assessment/Plan: Multiple sclerosis flare (new diagnosis): Clinically improving-continue high-dose steroids-await further recommendations from neurology.  Just admitted earlier this morning-see H&P for further details  DVT Prophylaxis: Prophylactic Lovenox   Code Status: Full code   Family Communication: None at bedside   Disposition Plan: Remain inpatient  Antimicrobial agents: Anti-infectives (From admission, onward)   None      Procedures: None  CONSULTS:  neurology  Time spent: 25- minutes-Greater than 50% of this time was spent in counseling, explanation of diagnosis, planning of further management, and coordination of care.  MEDICATIONS: Scheduled Meds: . enoxaparin (LOVENOX) injection  40 mg Subcutaneous Daily  . [START ON 01/31/2018] Influenza vac split quadrivalent PF  0.5 mL Intramuscular Tomorrow-1000  . pantoprazole  40 mg Oral Daily   Continuous Infusions: PRN Meds:.acetaminophen **OR** acetaminophen, ondansetron **OR** ondansetron (ZOFRAN) IV   PHYSICAL EXAM: Vital signs: Vitals:   01/30/18 0256 01/30/18 0300 01/30/18 0345 01/30/18 0459  BP:  112/63 132/84 123/79  Pulse: 82 86  77  Resp:      Temp:    99.9 F (37.7 C)  TempSrc:    Oral  SpO2: 99% 95%  97%  Weight:    (!) 143.3 kg  Height:     5\' 7"  (1.702 m)   Filed Weights   01/30/18 0459  Weight: (!) 143.3 kg   Body mass index is 49.48 kg/m.   General appearance :Awake, alert, not in any distress. HEENT: Atraumatic and Normocephalic Neck: supple Resp:Good air entry bilaterally, no added sounds  CVS: S1 S2 regular, no murmurs.  GI: Bowel sounds present, Non tender and not distended with no gaurding, rigidity or rebound.No organomegaly Extremities: B/L Lower Ext shows no edema, both legs are warm to touch  I have personally reviewed following labs and imaging studies  LABORATORY DATA: CBC: Recent Labs  Lab 01/29/18 1941  WBC 6.1  NEUTROABS 3.1  HGB 12.4  HCT 37.8  MCV 88.9  PLT 288    Basic Metabolic Panel: Recent Labs  Lab 01/29/18 1941  NA 138  K 4.1  CL 104  CO2 24  GLUCOSE 97  BUN 12  CREATININE 0.99  CALCIUM 9.0    GFR: Estimated Creatinine Clearance: 120.3 mL/min (by C-G formula based on SCr of 0.99 mg/dL).  Liver Function Tests: Recent Labs  Lab 01/29/18 1941  AST 16  ALT 18  ALKPHOS 59  BILITOT 0.5  PROT 7.2  ALBUMIN 3.6   No results for input(s): LIPASE, AMYLASE in the last 168 hours. No results for input(s): AMMONIA in the last 168 hours.  Coagulation Profile: No results for input(s): INR, PROTIME in the last 168 hours.  Cardiac Enzymes: No results for input(s): CKTOTAL, CKMB, CKMBINDEX, TROPONINI in the last 168 hours.  BNP (last  3 results) No results for input(s): PROBNP in the last 8760 hours.  HbA1C: No results for input(s): HGBA1C in the last 72 hours.  CBG: Recent Labs  Lab 01/30/18 0704 01/30/18 1102  GLUCAP 120* 174*    Lipid Profile: No results for input(s): CHOL, HDL, LDLCALC, TRIG, CHOLHDL, LDLDIRECT in the last 72 hours.  Thyroid Function Tests: No results for input(s): TSH, T4TOTAL, FREET4, T3FREE, THYROIDAB in the last 72 hours.  Anemia Panel: No results for input(s): VITAMINB12, FOLATE, FERRITIN, TIBC, IRON, RETICCTPCT in the last 72  hours.  Urine analysis:    Component Value Date/Time   COLORURINE STRAW (A) 01/29/2018 2251   APPEARANCEUR CLEAR 01/29/2018 2251   LABSPEC 1.008 01/29/2018 2251   PHURINE 6.0 01/29/2018 2251   GLUCOSEU NEGATIVE 01/29/2018 2251   HGBUR LARGE (A) 01/29/2018 2251   BILIRUBINUR NEGATIVE 01/29/2018 2251   KETONESUR NEGATIVE 01/29/2018 2251   PROTEINUR NEGATIVE 01/29/2018 2251   UROBILINOGEN 1.0 02/13/2014 0551   NITRITE NEGATIVE 01/29/2018 2251   LEUKOCYTESUR TRACE (A) 01/29/2018 2251    Sepsis Labs: Lactic Acid, Venous No results found for: LATICACIDVEN  MICROBIOLOGY: No results found for this or any previous visit (from the past 240 hour(s)).  RADIOLOGY STUDIES/RESULTS: Dg Chest 2 View  Result Date: 01/22/2018 CLINICAL DATA:  Chest pain, onset yesterday but progressive. Shortness of breath. EXAM: CHEST - 2 VIEW COMPARISON:  Radiograph 02/13/2014 FINDINGS: The cardiomediastinal contours are normal. The lungs are clear. Pulmonary vasculature is normal. No consolidation, pleural effusion, or pneumothorax. No acute osseous abnormalities are seen. IMPRESSION: No acute pulmonary process. Electronically Signed   By: Narda Rutherford M.D.   On: 01/22/2018 00:05   Mr Laqueta Jean And Wo Contrast  Result Date: 01/30/2018 CLINICAL DATA:  Two days of RIGHT arm numbness now in LEFT hand. Lower extremity paresthesias for several weeks. Assess stroke versus demyelination. History of hypertension. EXAM: MRI HEAD WITHOUT AND WITH CONTRAST MRI CERVICAL SPINE WITHOUT AND WITH CONTRAST TECHNIQUE: Multiplanar, multiecho pulse sequences of the brain and surrounding structures, and cervical spine, to include the craniocervical junction and cervicothoracic junction, were obtained without and with intravenous contrast. CONTRAST:  10 cc Gadavist COMPARISON:  None. FINDINGS: MRI HEAD FINDINGS INTRACRANIAL CONTENTS: Punctate reduced diffusion LEFT periatrial white matter with low ADC values favoring hyperacute  demyelination. Greater then 10 supratentorial white matter lesions with low T1 signal and T2 shine through, many of which radiate from the periventricular margin. Dominant lesion RIGHT frontal lobe measuring 11 mm. Juxtacortical and bifrontal cortical lesions. At least 6 infratentorial lesions. Faint enhancement of RIGHT pontine, LEFT periatrial, RIGHT parietal, RIGHT frontal juxta cortical lesion. No susceptibility artifact to suggest hemorrhage. The ventricles and sulci are normal for patient's age. No masses or mass effect. No abnormal intraparenchymal or extra-axial enhancement. No abnormal extra-axial fluid collections. No extra-axial masses. 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 VASCULAR: Normal major intracranial vascular flow voids present at skull base. SKULL AND UPPER CERVICAL SPINE: No abnormal sellar expansion. No suspicious calvarial bone marrow signal. Craniocervical junction maintained. SINUSES/ORBITS: Mild paranasal sinus mucosal thickening, lobulated maxillary component. Mastoid air cells are well aerated.The included ocular globes and orbital contents are non-suspicious. OTHER: None. MRI CERVICAL SPINE FINDINGS ALIGNMENT: Straightened cervical lordosis.  No malalignment. VERTEBRAE/DISCS: Vertebral bodies are intact. Intervertebral disc morphology's and signal are normal. No acute or abnormal bone marrow signal. No abnormal osseous or disc enhancement. CORD:At least 4 spinal cord lesions identified, dominant expansile lesion is 17 mm in craniocaudad dimension at C6-7  within RIGHT hemicord with enhancement. Subcentimeter lesions at C2-3 with faint enhancement. No myelomalacia or syrinx. POSTERIOR FOSSA, VERTEBRAL ARTERIES, PARASPINAL TISSUES: No MR findings of ligamentous injury. Vertebral artery flow voids present. Included posterior fossa and paraspinal soft tissues are normal. DISC LEVELS: C2-3 through C4-5: No disc bulge, canal stenosis nor neural foraminal  narrowing. C5-6: Small central disc protrusion. Mild canal stenosis without neural foraminal narrowing. C6-7 and C7-T1: No disc bulge, canal stenosis nor neural foraminal narrowing. IMPRESSION: MRI HEAD: 1. Severe demyelination with multiple supra-and infratentorial lesions varying from hyperacute to acute and chronic. 2. No parenchymal brain volume loss for age. 3. 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 MRI CERVICAL SPINE: 1. Spinal cord demyelination with dominant 17 mm acute enhancing lesion at C6-7. No myelomalacia. 2. Mild canal stenosis C5-6. Acute findings discussed with and reconfirmed by Dr.STEPHEN RANCOUR on 01/30/2018 at 2:33 am. Electronically Signed   By: Awilda Metro M.D.   On: 01/30/2018 02:33   Mr Cervical Spine W Wo Contrast  Result Date: 01/30/2018 CLINICAL DATA:  Two days of RIGHT arm numbness now in LEFT hand. Lower extremity paresthesias for several weeks. Assess stroke versus demyelination. History of hypertension. EXAM: MRI HEAD WITHOUT AND WITH CONTRAST MRI CERVICAL SPINE WITHOUT AND WITH CONTRAST TECHNIQUE: Multiplanar, multiecho pulse sequences of the brain and surrounding structures, and cervical spine, to include the craniocervical junction and cervicothoracic junction, were obtained without and with intravenous contrast. CONTRAST:  10 cc Gadavist COMPARISON:  None. FINDINGS: MRI HEAD FINDINGS INTRACRANIAL CONTENTS: Punctate reduced diffusion LEFT periatrial white matter with low ADC values favoring hyperacute demyelination. Greater then 10 supratentorial white matter lesions with low T1 signal and T2 shine through, many of which radiate from the periventricular margin. Dominant lesion RIGHT frontal lobe measuring 11 mm. Juxtacortical and bifrontal cortical lesions. At least 6 infratentorial lesions. Faint enhancement of RIGHT pontine, LEFT periatrial, RIGHT parietal, RIGHT frontal juxta cortical lesion. No susceptibility artifact to suggest hemorrhage.  The ventricles and sulci are normal for patient's age. No masses or mass effect. No abnormal intraparenchymal or extra-axial enhancement. No abnormal extra-axial fluid collections. No extra-axial masses. 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 VASCULAR: Normal major intracranial vascular flow voids present at skull base. SKULL AND UPPER CERVICAL SPINE: No abnormal sellar expansion. No suspicious calvarial bone marrow signal. Craniocervical junction maintained. SINUSES/ORBITS: Mild paranasal sinus mucosal thickening, lobulated maxillary component. Mastoid air cells are well aerated.The included ocular globes and orbital contents are non-suspicious. OTHER: None. MRI CERVICAL SPINE FINDINGS ALIGNMENT: Straightened cervical lordosis.  No malalignment. VERTEBRAE/DISCS: Vertebral bodies are intact. Intervertebral disc morphology's and signal are normal. No acute or abnormal bone marrow signal. No abnormal osseous or disc enhancement. CORD:At least 4 spinal cord lesions identified, dominant expansile lesion is 17 mm in craniocaudad dimension at C6-7 within RIGHT hemicord with enhancement. Subcentimeter lesions at C2-3 with faint enhancement. No myelomalacia or syrinx. POSTERIOR FOSSA, VERTEBRAL ARTERIES, PARASPINAL TISSUES: No MR findings of ligamentous injury. Vertebral artery flow voids present. Included posterior fossa and paraspinal soft tissues are normal. DISC LEVELS: C2-3 through C4-5: No disc bulge, canal stenosis nor neural foraminal narrowing. C5-6: Small central disc protrusion. Mild canal stenosis without neural foraminal narrowing. C6-7 and C7-T1: No disc bulge, canal stenosis nor neural foraminal narrowing. IMPRESSION: MRI HEAD: 1. Severe demyelination with multiple supra-and infratentorial lesions varying from hyperacute to acute and chronic. 2. No parenchymal brain volume loss for age. 3. 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 MRI CERVICAL SPINE:  1.  Spinal cord demyelination with dominant 17 mm acute enhancing lesion at C6-7. No myelomalacia. 2. Mild canal stenosis C5-6. Acute findings discussed with and reconfirmed by Dr.STEPHEN RANCOUR on 01/30/2018 at 2:33 am. Electronically Signed   By: Awilda Metroourtnay  Bloomer M.D.   On: 01/30/2018 02:33     LOS: 0 days   Jeoffrey MassedShanker Ghimire, MD  [facility]  If 7PM-7AM, please contact night-coverage  Please page via [email or web address]-Password [email or web address]-click on MD name and type text message  01/30/2018, 11:33 AM

## 2018-01-31 LAB — BASIC METABOLIC PANEL
Anion gap: 11 (ref 5–15)
BUN: 9 mg/dL (ref 6–20)
CO2: 23 mmol/L (ref 22–32)
Calcium: 9.4 mg/dL (ref 8.9–10.3)
Chloride: 104 mmol/L (ref 98–111)
Creatinine, Ser: 0.95 mg/dL (ref 0.44–1.00)
GFR calc Af Amer: >60 mL/min (ref >60)
GFR calc non Af Amer: >60 mL/min (ref >60)
Glucose, Bld: 129 mg/dL — ABNORMAL HIGH (ref 70–99)
Potassium: 3.8 mmol/L (ref 3.5–5.1)
Sodium: 138 mmol/L (ref 135–145)

## 2018-01-31 LAB — GLUCOSE, CAPILLARY
Glucose-Capillary: 127 mg/dL — ABNORMAL HIGH (ref 70–99)
Glucose-Capillary: 127 mg/dL — ABNORMAL HIGH (ref 70–99)
Glucose-Capillary: 144 mg/dL — ABNORMAL HIGH (ref 70–99)

## 2018-01-31 NOTE — Plan of Care (Signed)
  Problem: Clinical Measurements: Goal: Ability to maintain clinical measurements within normal limits will improve Outcome: Progressing   Problem: Activity: Goal: Risk for activity intolerance will decrease Outcome: Progressing   

## 2018-01-31 NOTE — Progress Notes (Signed)
PROGRESS NOTE    Amanda SkeensMarinda Wise  WUJ:[id number] DOB: 30-Oct-1985 DOA: 01/29/2018 PCP: Patient, No Pcp Per   Brief Narrative:  Patient is a 32 y.o. female with no significant past medical history-presented with decreased sensation to right hand-and paresthesias of all of her extremities.  Upon further evaluation with neuroimaging-found to have multiple sclerosis-started on high-dose steroids and admitted to the hospitalist service.  Assessment & Plan:   Active Problems:   Multiple sclerosis (HCC)   1. New diagnosis of multiple sclerosis with flare.  She is clinically improving with high-dose IV steroids per neurological examination this morning.  She is to remain on IV steroids for 3 more days to complete a 5-day course of treatment.   DVT prophylaxis: Lovenox Code Status: Full Family Communication: None Disposition Plan: Continue on IV methylprednisolone for 3 more days to complete 5-day course of treatment.  Appears to be clinically improving per neurology.   Consultants:   Neurology  Procedures:   None  Antimicrobials:   None   Subjective: Patient seen and evaluated today with no new acute complaints or concerns. No acute concerns or events noted overnight.  She is able to have move her right hand better and has more strength.  She has improved sensation in this area as well on physical examination.  Objective: Vitals:   01/30/18 1331 01/30/18 1353 01/30/18 2002 01/31/18 0536  BP:  123/75 131/80 120/77  Pulse:  100 84 85  Resp:  16 17 17   Temp:  98 F (36.7 C) 98 F (36.7 C) 98.3 F (36.8 C)  TempSrc:  Oral Oral Oral  SpO2: 99% 96% 98% 98%  Weight:      Height:        Intake/Output Summary (Last 24 hours) at 01/31/2018 1044 Last data filed at 01/30/2018 1807 Gross per 24 hour  Intake 600 ml  Output 0 ml  Net 600 ml   Filed Weights   01/30/18 0459  Weight: (!) 143.3 kg    Examination:  General exam: Appears calm and comfortable,  obese Respiratory system: Clear to auscultation. Respiratory effort normal. Cardiovascular system: S1 & S2 heard, RRR. No JVD, murmurs, rubs, gallops or clicks. No pedal edema. Gastrointestinal system: Abdomen is nondistended, soft and nontender. No organomegaly or masses felt. Normal bowel sounds heard. Central nervous system: Alert and oriented. No focal neurological deficits.  Improved sensation to light touch in bilateral upper extremities grip strength appears to be approximately equal bilaterally. Extremities: Symmetric 5 x 5 power. Skin: No rashes, lesions or ulcers Psychiatry: Judgement and insight appear normal. Mood & affect appropriate.     Data Reviewed: I have personally reviewed following labs and imaging studies  CBC: Recent Labs  Lab 01/29/18 1941  WBC 6.1  NEUTROABS 3.1  HGB 12.4  HCT 37.8  MCV 88.9  PLT 288   Basic Metabolic Panel: Recent Labs  Lab 01/29/18 1941 01/31/18 0133  NA 138 138  K 4.1 3.8  CL 104 104  CO2 24 23  GLUCOSE 97 129*  BUN 12 9  CREATININE 0.99 0.95  CALCIUM 9.0 9.4   GFR: Estimated Creatinine Clearance: 125.4 mL/min (by C-G formula based on SCr of 0.95 mg/dL). Liver Function Tests: Recent Labs  Lab 01/29/18 1941  AST 16  ALT 18  ALKPHOS 59  BILITOT 0.5  PROT 7.2  ALBUMIN 3.6   No results for input(s): LIPASE, AMYLASE in the last 168 hours. No results for input(s): AMMONIA in the last 168 hours. Coagulation Profile: No  results for input(s): INR, PROTIME in the last 168 hours. Cardiac Enzymes: No results for input(s): CKTOTAL, CKMB, CKMBINDEX, TROPONINI in the last 168 hours. BNP (last 3 results) No results for input(s): PROBNP in the last 8760 hours. HbA1C: No results for input(s): HGBA1C in the last 72 hours. CBG: Recent Labs  Lab 01/30/18 0704 01/30/18 1102 01/30/18 1606 01/30/18 2047 01/31/18 0615  GLUCAP 120* 174* 154* 122* 127*   Lipid Profile: No results for input(s): CHOL, HDL, LDLCALC, TRIG, CHOLHDL,  LDLDIRECT in the last 72 hours. Thyroid Function Tests: No results for input(s): TSH, T4TOTAL, FREET4, T3FREE, THYROIDAB in the last 72 hours. Anemia Panel: No results for input(s): VITAMINB12, FOLATE, FERRITIN, TIBC, IRON, RETICCTPCT in the last 72 hours. Sepsis Labs: No results for input(s): PROCALCITON, LATICACIDVEN in the last 168 hours.  No results found for this or any previous visit (from the past 240 hour(s)).       Radiology Studies: Mr Laqueta Jean And Wo Contrast  Result Date: 01/30/2018 CLINICAL DATA:  Two days of RIGHT arm numbness now in LEFT hand. Lower extremity paresthesias for several weeks. Assess stroke versus demyelination. History of hypertension. EXAM: MRI HEAD WITHOUT AND WITH CONTRAST MRI CERVICAL SPINE WITHOUT AND WITH CONTRAST TECHNIQUE: Multiplanar, multiecho pulse sequences of the brain and surrounding structures, and cervical spine, to include the craniocervical junction and cervicothoracic junction, were obtained without and with intravenous contrast. CONTRAST:  10 cc Gadavist COMPARISON:  None. FINDINGS: MRI HEAD FINDINGS INTRACRANIAL CONTENTS: Punctate reduced diffusion LEFT periatrial white matter with low ADC values favoring hyperacute demyelination. Greater then 10 supratentorial white matter lesions with low T1 signal and T2 shine through, many of which radiate from the periventricular margin. Dominant lesion RIGHT frontal lobe measuring 11 mm. Juxtacortical and bifrontal cortical lesions. At least 6 infratentorial lesions. Faint enhancement of RIGHT pontine, LEFT periatrial, RIGHT parietal, RIGHT frontal juxta cortical lesion. No susceptibility artifact to suggest hemorrhage. The ventricles and sulci are normal for patient's age. No masses or mass effect. No abnormal intraparenchymal or extra-axial enhancement. No abnormal extra-axial fluid collections. No extra-axial masses. 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 VASCULAR: Normal  major intracranial vascular flow voids present at skull base. SKULL AND UPPER CERVICAL SPINE: No abnormal sellar expansion. No suspicious calvarial bone marrow signal. Craniocervical junction maintained. SINUSES/ORBITS: Mild paranasal sinus mucosal thickening, lobulated maxillary component. Mastoid air cells are well aerated.The included ocular globes and orbital contents are non-suspicious. OTHER: None. MRI CERVICAL SPINE FINDINGS ALIGNMENT: Straightened cervical lordosis.  No malalignment. VERTEBRAE/DISCS: Vertebral bodies are intact. Intervertebral disc morphology's and signal are normal. No acute or abnormal bone marrow signal. No abnormal osseous or disc enhancement. CORD:At least 4 spinal cord lesions identified, dominant expansile lesion is 17 mm in craniocaudad dimension at C6-7 within RIGHT hemicord with enhancement. Subcentimeter lesions at C2-3 with faint enhancement. No myelomalacia or syrinx. POSTERIOR FOSSA, VERTEBRAL ARTERIES, PARASPINAL TISSUES: No MR findings of ligamentous injury. Vertebral artery flow voids present. Included posterior fossa and paraspinal soft tissues are normal. DISC LEVELS: C2-3 through C4-5: No disc bulge, canal stenosis nor neural foraminal narrowing. C5-6: Small central disc protrusion. Mild canal stenosis without neural foraminal narrowing. C6-7 and C7-T1: No disc bulge, canal stenosis nor neural foraminal narrowing. IMPRESSION: MRI HEAD: 1. Severe demyelination with multiple supra-and infratentorial lesions varying from hyperacute to acute and chronic. 2. No parenchymal brain volume loss for age. 3. 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 MRI CERVICAL SPINE: 1. Spinal cord demyelination with dominant 17  mm acute enhancing lesion at C6-7. No myelomalacia. 2. Mild canal stenosis C5-6. Acute findings discussed with and reconfirmed by Dr.STEPHEN RANCOUR on 01/30/2018 at 2:33 am. Electronically Signed   By: Awilda Metro M.D.   On: 01/30/2018 02:33     Mr Cervical Spine W Wo Contrast  Result Date: 01/30/2018 CLINICAL DATA:  Two days of RIGHT arm numbness now in LEFT hand. Lower extremity paresthesias for several weeks. Assess stroke versus demyelination. History of hypertension. EXAM: MRI HEAD WITHOUT AND WITH CONTRAST MRI CERVICAL SPINE WITHOUT AND WITH CONTRAST TECHNIQUE: Multiplanar, multiecho pulse sequences of the brain and surrounding structures, and cervical spine, to include the craniocervical junction and cervicothoracic junction, were obtained without and with intravenous contrast. CONTRAST:  10 cc Gadavist COMPARISON:  None. FINDINGS: MRI HEAD FINDINGS INTRACRANIAL CONTENTS: Punctate reduced diffusion LEFT periatrial white matter with low ADC values favoring hyperacute demyelination. Greater then 10 supratentorial white matter lesions with low T1 signal and T2 shine through, many of which radiate from the periventricular margin. Dominant lesion RIGHT frontal lobe measuring 11 mm. Juxtacortical and bifrontal cortical lesions. At least 6 infratentorial lesions. Faint enhancement of RIGHT pontine, LEFT periatrial, RIGHT parietal, RIGHT frontal juxta cortical lesion. No susceptibility artifact to suggest hemorrhage. The ventricles and sulci are normal for patient's age. No masses or mass effect. No abnormal intraparenchymal or extra-axial enhancement. No abnormal extra-axial fluid collections. No extra-axial masses. 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 VASCULAR: Normal major intracranial vascular flow voids present at skull base. SKULL AND UPPER CERVICAL SPINE: No abnormal sellar expansion. No suspicious calvarial bone marrow signal. Craniocervical junction maintained. SINUSES/ORBITS: Mild paranasal sinus mucosal thickening, lobulated maxillary component. Mastoid air cells are well aerated.The included ocular globes and orbital contents are non-suspicious. OTHER: None. MRI CERVICAL SPINE FINDINGS ALIGNMENT:  Straightened cervical lordosis.  No malalignment. VERTEBRAE/DISCS: Vertebral bodies are intact. Intervertebral disc morphology's and signal are normal. No acute or abnormal bone marrow signal. No abnormal osseous or disc enhancement. CORD:At least 4 spinal cord lesions identified, dominant expansile lesion is 17 mm in craniocaudad dimension at C6-7 within RIGHT hemicord with enhancement. Subcentimeter lesions at C2-3 with faint enhancement. No myelomalacia or syrinx. POSTERIOR FOSSA, VERTEBRAL ARTERIES, PARASPINAL TISSUES: No MR findings of ligamentous injury. Vertebral artery flow voids present. Included posterior fossa and paraspinal soft tissues are normal. DISC LEVELS: C2-3 through C4-5: No disc bulge, canal stenosis nor neural foraminal narrowing. C5-6: Small central disc protrusion. Mild canal stenosis without neural foraminal narrowing. C6-7 and C7-T1: No disc bulge, canal stenosis nor neural foraminal narrowing. IMPRESSION: MRI HEAD: 1. Severe demyelination with multiple supra-and infratentorial lesions varying from hyperacute to acute and chronic. 2. No parenchymal brain volume loss for age. 3. 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 MRI CERVICAL SPINE: 1. Spinal cord demyelination with dominant 17 mm acute enhancing lesion at C6-7. No myelomalacia. 2. Mild canal stenosis C5-6. Acute findings discussed with and reconfirmed by Dr.STEPHEN RANCOUR on 01/30/2018 at 2:33 am. Electronically Signed   By: Awilda Metro M.D.   On: 01/30/2018 02:33        Scheduled Meds: . enoxaparin (LOVENOX) injection  40 mg Subcutaneous Daily  . insulin aspart  0-9 Units Subcutaneous TID WC  . pantoprazole  40 mg Oral Daily   Continuous Infusions: . methylPREDNISolone (SOLU-MEDROL) injection 1,000 mg (01/31/18 0501)     LOS: 1 day    Time spent: 30 minutes    Rechy Bost Hoover Brunette, DO [facility] Pager [phone]  If 7PM-7AM,  please contact  night-coverage [email or web address] Password [facility] 01/31/2018, 10:44 AM

## 2018-01-31 NOTE — Progress Notes (Addendum)
NEURO HOSPITALIST PROGRESS NOTE   Subjective: Patient awake, alert, walking around the room on her cell phone. NAD. Patient states that she is feeling better. Discussed plan of care and she verbalized an understanding.   Exam: Vitals:   01/30/18 2002 01/31/18 0536  BP: 131/80 120/77  Pulse: 84 85  Resp: 17 17  Temp: 98 F (36.7 C) 98.3 F (36.8 C)  SpO2: 98% 98%    Physical Exam   HEENT-  Normocephalic, no lesions, without obvious abnormality.  Normal external eye and conjunctiva.   Cardiovascular- S1-S2 audible, pulses palpable throughout   Lungs-no rhonchi or wheezing noted, no excessive working breathing.  Saturations within normal limits on RA Abdomen- All 4 quadrants palpated and nontender Extremities- Warm, dry and intact Musculoskeletal-no joint tenderness, deformity or swelling Skin-warm and dry, no hyperpigmentation, vitiligo, or suspicious lesions   Neuro:  Mental Status: Alert, oriented, thought content appropriate.  Speech fluent without evidence of aphasia.  Able to follow  commands without difficulty. Cranial Nerves: II: Visual fields grossly normal,  III,IV, VI: ptosis not present, extra-ocular motions intact bilaterally pupils equal, round, reactive to light and accommodation V,VII: smile symmetric, facial light touch sensation normal bilaterally VIII: hearing normal bilaterally IX,X: uvula rises midline XI: bilateral shoulder shrug XII: midline tongue extension Motor: Right : Upper extremity   5/5  Left:     Upper extremity   5/5  Lower extremity   5/5   Lower extremity   5/5 Tone and bulk:normal tone throughout; no atrophy noted Sensory: temp sensation decreased on left hand and arm, equal  Deep Tendon Reflexes: 2+ and symmetric biceps, 3+ patella, 2+ achilles  Plantars: Right: downgoing   Left: downgoing Cerebellar: normal finger-to-nose, normal rapid alternating movements and normal heel-to-shin test Gait: normal gait and  station; patient ambulating in room.     Medications:  Scheduled: . enoxaparin (LOVENOX) injection  40 mg Subcutaneous Daily  . insulin aspart  0-9 Units Subcutaneous TID WC  . pantoprazole  40 mg Oral Daily   Continuous: . methylPREDNISolone (SOLU-MEDROL) injection 1,000 mg (01/31/18 0501)   ZOX:[id number] **OR** acetaminophen, hydrALAZINE, ipratropium-albuterol, ondansetron **OR** ondansetron (ZOFRAN) IV  Pertinent Labs/Diagnostics:   Mr Laqueta Jean And Wo Contrast  Result Date: 01/30/2018 CLINICAL DATA:  Two days of RIGHT arm numbness now in LEFT hand. Lower extremity paresthesias for several weeks. Assess stroke versus demyelination. History of hypertension. EXAM: MRI HEAD WITHOUT AND WITH CONTRAST MRI CERVICAL SPINE WITHOUT AND WITH CONTRAST TECHNIQUE: Multiplanar, multiecho pulse sequences of the brain and surrounding structures, and cervical spine, to include the craniocervical junction and cervicothoracic junction, were obtained without and with intravenous contrast. CONTRAST:  10 cc Gadavist COMPARISON:  None. FINDINGS: MRI HEAD FINDINGS INTRACRANIAL CONTENTS: Punctate reduced diffusion LEFT periatrial white matter with low ADC values favoring hyperacute demyelination. Greater then 10 supratentorial white matter lesions with low T1 signal and T2 shine through, many of which radiate from the periventricular margin. Dominant lesion RIGHT frontal lobe measuring 11 mm. Juxtacortical and bifrontal cortical lesions. At least 6 infratentorial lesions. Faint enhancement of RIGHT pontine, LEFT periatrial, RIGHT parietal, RIGHT frontal juxta cortical lesion. No susceptibility artifact to suggest hemorrhage. The ventricles and sulci are normal for patient's age. No masses or mass effect. No abnormal intraparenchymal or extra-axial enhancement. No abnormal extra-axial fluid collections. No extra-axial masses. 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 VASCULAR: Normal  major intracranial vascular flow voids present at skull base. SKULL AND UPPER CERVICAL SPINE: No abnormal sellar expansion. No suspicious calvarial bone marrow signal. Craniocervical junction maintained. SINUSES/ORBITS: Mild paranasal sinus mucosal thickening, lobulated maxillary component. Mastoid air cells are well aerated.The included ocular globes and orbital contents are non-suspicious. OTHER: None. MRI CERVICAL SPINE FINDINGS ALIGNMENT: Straightened cervical lordosis.  No malalignment. VERTEBRAE/DISCS: Vertebral bodies are intact. Intervertebral disc morphology's and signal are normal. No acute or abnormal bone marrow signal. No abnormal osseous or disc enhancement. CORD:At least 4 spinal cord lesions identified, dominant expansile lesion is 17 mm in craniocaudad dimension at C6-7 within RIGHT hemicord with enhancement. Subcentimeter lesions at C2-3 with faint enhancement. No myelomalacia or syrinx. POSTERIOR FOSSA, VERTEBRAL ARTERIES, PARASPINAL TISSUES: No MR findings of ligamentous injury. Vertebral artery flow voids present. Included posterior fossa and paraspinal soft tissues are normal. DISC LEVELS: C2-3 through C4-5: No disc bulge, canal stenosis nor neural foraminal narrowing. C5-6: Small central disc protrusion. Mild canal stenosis without neural foraminal narrowing. C6-7 and C7-T1: No disc bulge, canal stenosis nor neural foraminal narrowing. IMPRESSION: MRI HEAD: 1. Severe demyelination with multiple supra-and infratentorial lesions varying from hyperacute to acute and chronic. 2. No parenchymal brain volume loss for age. 3. 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 MRI CERVICAL SPINE: 1. Spinal cord demyelination with dominant 17 mm acute enhancing lesion at C6-7. No myelomalacia. 2. Mild canal stenosis C5-6. Acute findings discussed with and reconfirmed by Dr.STEPHEN RANCOUR on 01/30/2018 at 2:33 am. Electronically Signed   By: Awilda Metro M.D.   On: 01/30/2018 02:33    Mr Cervical Spine W Wo Contrast  Result Date: 01/30/2018 CLINICAL DATA:  Two days of RIGHT arm numbness now in LEFT hand. Lower extremity paresthesias for several weeks. Assess stroke versus demyelination. History of hypertension. EXAM: MRI HEAD WITHOUT AND WITH CONTRAST MRI CERVICAL SPINE WITHOUT AND WITH CONTRAST TECHNIQUE: Multiplanar, multiecho pulse sequences of the brain and surrounding structures, and cervical spine, to include the craniocervical junction and cervicothoracic junction, were obtained without and with intravenous contrast. CONTRAST:  10 cc Gadavist COMPARISON:  None. FINDINGS: MRI HEAD FINDINGS INTRACRANIAL CONTENTS: Punctate reduced diffusion LEFT periatrial white matter with low ADC values favoring hyperacute demyelination. Greater then 10 supratentorial white matter lesions with low T1 signal and T2 shine through, many of which radiate from the periventricular margin. Dominant lesion RIGHT frontal lobe measuring 11 mm. Juxtacortical and bifrontal cortical lesions. At least 6 infratentorial lesions. Faint enhancement of RIGHT pontine, LEFT periatrial, RIGHT parietal, RIGHT frontal juxta cortical lesion. No susceptibility artifact to suggest hemorrhage. The ventricles and sulci are normal for patient's age. No masses or mass effect. No abnormal intraparenchymal or extra-axial enhancement. No abnormal extra-axial fluid collections. No extra-axial masses. 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 VASCULAR: Normal major intracranial vascular flow voids present at skull base. SKULL AND UPPER CERVICAL SPINE: No abnormal sellar expansion. No suspicious calvarial bone marrow signal. Craniocervical junction maintained. SINUSES/ORBITS: Mild paranasal sinus mucosal thickening, lobulated maxillary component. Mastoid air cells are well aerated.The included ocular globes and orbital contents are non-suspicious. OTHER: None. MRI CERVICAL SPINE FINDINGS ALIGNMENT:  Straightened cervical lordosis.  No malalignment. VERTEBRAE/DISCS: Vertebral bodies are intact. Intervertebral disc morphology's and signal are normal. No acute or abnormal bone marrow signal. No abnormal osseous or disc enhancement. CORD:At least 4 spinal cord lesions identified, dominant expansile lesion is 17 mm in craniocaudad dimension at C6-7 within RIGHT hemicord with enhancement. Subcentimeter lesions at C2-3 with faint enhancement.  No myelomalacia or syrinx. POSTERIOR FOSSA, VERTEBRAL ARTERIES, PARASPINAL TISSUES: No MR findings of ligamentous injury. Vertebral artery flow voids present. Included posterior fossa and paraspinal soft tissues are normal. DISC LEVELS: C2-3 through C4-5: No disc bulge, canal stenosis nor neural foraminal narrowing. C5-6: Small central disc protrusion. Mild canal stenosis without neural foraminal narrowing. C6-7 and C7-T1: No disc bulge, canal stenosis nor neural foraminal narrowing. IMPRESSION: MRI HEAD: 1. Severe demyelination with multiple supra-and infratentorial lesions varying from hyperacute to acute and chronic. 2. No parenchymal brain volume loss for age. 3. 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 MRI CERVICAL SPINE: 1. Spinal cord demyelination with dominant 17 mm acute enhancing lesion at C6-7. No myelomalacia. 2. Mild canal stenosis C5-6. Acute findings discussed with and reconfirmed by Dr.STEPHEN RANCOUR on 01/30/2018 at 2:33 am. Electronically Signed   By: Awilda Metroourtnay  Bloomer M.D.   On: 01/30/2018 02:33   Assessment: 32 year old female with new right sided weakness, imaging and clinical history very consistent with MS. At this point, I don't think other diagnostic testing is needed at this time.   Recommendations: 1. IV Solumedrol 1000 mg qd x 5 days. Dose 2/5 2. Monitor CBG closely while on pulsed dose steroids 3. Outpatient follow up with Dr. Epimenio FootSater in 2-4 weeks' time.  Valentina LucksJessica Williams, MSN, NP-C [facility] [phone]  01/31/2018, 9:41 AM  Attending neurologist's note to follow  I have seen the patient and reviewed the above note. She is feeling better.  She still has mild right arm weakness, 5-/5. She will need a full 5 days of solumedrol given the degree of inflammation seen.  I feel that imaging/clinical course is adequate for diagnosis, do not think CSF would change my management currently.   Ritta SlotMcNeill Juanmanuel Marohl, MD [facility] [phone]  If 7pm- 7am, please page neurology on call as listed in AMION.

## 2018-01-31 NOTE — Progress Notes (Signed)
Occupational Therapy Treatment Patient Details Name: Lorne SkeensMarinda Summa MRN: [id number] DOB: 13-Nov-1985 Today's Date: 01/31/2018    History of present illness Lorne SkeensMarinda Sweney is a 32 y.o. female with medical history significant of HTN. Pt has been experiencing decreased sensation to R arm and L hand.  Has been having paresthesias / numbness to B legs for several weeks. 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   OT comments  Pt progressing with OTgoals this session. Established HEP with soft (tan) theraputty, reviewed energy conservation. OT will continue to follow acutely prior to dc home and No OT follow up remains appropriate.   Follow Up Recommendations  No OT follow up    Equipment Recommendations  None recommended by OT    Recommendations for Other Services      Precautions / Restrictions Precautions Precautions: None Restrictions Weight Bearing Restrictions: No       Mobility Bed Mobility Overal bed mobility: Independent                Transfers Overall transfer level: Independent                    Balance Overall balance assessment: Independent                                         ADL either performed or assessed with clinical judgement   ADL Overall ADL's : Independent Eating/Feeding: Independent                       Toilet Transfer: Independent           Functional mobility during ADLs: Independent       Vision       Perception     Praxis      Cognition Arousal/Alertness: Awake/alert Behavior During Therapy: WFL for tasks assessed/performed Overall Cognitive Status: Within Functional Limits for tasks assessed                                          Exercises Exercises: Other exercises Other Exercises Other Exercises: theraputty exercises with tan (soft) putty provided Other Exercises: Pt performed snaps on  hospital gown x10   Shoulder Instructions       General Comments      Pertinent Vitals/ Pain       Pain Assessment: No/denies pain  Home Living                                          Prior Functioning/Environment              Frequency  Min 2X/week        Progress Toward Goals  OT Goals(current goals can now be found in the care plan section)  Progress towards OT goals: Progressing toward goals  Acute Rehab OT Goals Patient Stated Goal: "to be independent and stay independent for my kids" OT Goal Formulation: With patient Time For Goal Achievement: 02/13/18 Potential to Achieve Goals: Good  Plan Discharge plan remains appropriate;Frequency remains appropriate    Co-evaluation  AM-PAC OT "6 Clicks" Daily Activity     Outcome Measure                    End of Session    OT Visit Diagnosis: Muscle weakness (generalized) (M62.81);Other symptoms and signs involving the nervous system (R29.898)   Activity Tolerance Patient tolerated treatment well   Patient Left in chair;with call bell/phone within reach   Nurse Communication Mobility status        Time: 3762-8315 OT Time Calculation (min): 21 min  Charges: OT General Charges $OT Visit: 1 Visit OT Treatments $Therapeutic Exercise: 8-22 mins  Sherryl Manges OTR/L Acute Rehabilitation Services Pager: [phone] Office: [phone]   Evern Bio Bianey Tesoro 01/31/2018, 5:21 PM

## 2018-02-01 DIAGNOSIS — R200 Anesthesia of skin: Secondary | ICD-10-CM

## 2018-02-01 LAB — GLUCOSE, CAPILLARY
Glucose-Capillary: 115 mg/dL — ABNORMAL HIGH (ref 70–99)
Glucose-Capillary: 143 mg/dL — ABNORMAL HIGH (ref 70–99)
Glucose-Capillary: 130 mg/dL — ABNORMAL HIGH (ref 70–99)
Glucose-Capillary: 125 mg/dL — ABNORMAL HIGH (ref 70–99)

## 2018-02-01 LAB — BASIC METABOLIC PANEL
Anion gap: 11 (ref 5–15)
BUN: 19 mg/dL (ref 6–20)
CO2: 24 mmol/L (ref 22–32)
CREATININE: 1.04 mg/dL — AB (ref 0.44–1.00)
Calcium: 8.8 mg/dL — ABNORMAL LOW (ref 8.9–10.3)
Chloride: 102 mmol/L (ref 98–111)
GFR calc Af Amer: >60 mL/min (ref >60)
Glucose, Bld: 118 mg/dL — ABNORMAL HIGH (ref 70–99)
Potassium: 4.1 mmol/L (ref 3.5–5.1)
Sodium: 137 mmol/L (ref 135–145)

## 2018-02-01 NOTE — Progress Notes (Signed)
PROGRESS NOTE    Lorne SkeensMarinda Hosman  ZOX:[id number] DOB: 04/16/1985 DOA: 01/29/2018 PCP: Patient, No Pcp Per   Brief Narrative:  Patient is a 32 y.o. female with no significant past medical history-presented with decreased sensation to right hand-and paresthesias of all of her extremities.  Upon further evaluation with neuroimaging-found to have multiple sclerosis-started on high-dose steroids and admitted to the hospitalist service.  Assessment & Plan:   Active Problems:   Multiple sclerosis (HCC)   Numbness   1. New diagnosis of multiple sclerosis with flare.  She is clinically improving with high-dose IV steroids per neurological examination this morning.  She is to remain on IV steroids for 2 more days to complete a 5-day course of treatment.   DVT prophylaxis: Lovenox Code Status: Full Family Communication: None Disposition Plan: Continue on IV methylprednisolone for 2 more days to complete 5-day course of treatment.  Appears to be clinically improving per neurology.   Consultants:   Neurology  Procedures:   None  Antimicrobials:   None   Subjective: No acute complaint no nausea or vomiting.  No fever no chills.  Still has waxing and waning numbness but no weakness.  Objective: Vitals:   01/31/18 1550 01/31/18 1955 02/01/18 0620 02/01/18 1318  BP: 122/77 114/62 103/67 103/73  Pulse: 72 75 67 77  Resp: 18 18  20   Temp: 98.2 F (36.8 C) 98.2 F (36.8 C) 97.9 F (36.6 C) 98.1 F (36.7 C)  TempSrc: Oral Oral Oral Oral  SpO2: 97% 97% 98% 98%  Weight:      Height:        Intake/Output Summary (Last 24 hours) at 02/01/2018 1652 Last data filed at 02/01/2018 1318 Gross per 24 hour  Intake 538 ml  Output -  Net 538 ml   Filed Weights   01/30/18 0459  Weight: (!) 143.3 kg    Examination:  General exam: Appears calm and comfortable, obese Respiratory system: Clear to auscultation. Respiratory effort normal. Cardiovascular system: S1 & S2 heard, RRR.  No JVD, murmurs, rubs, gallops or clicks. No pedal edema. Gastrointestinal system: Abdomen is nondistended, soft and nontender. No organomegaly or masses felt. Normal bowel sounds heard. Central nervous system: Alert and oriented. No focal neurological deficits.  Improved sensation to light touch in bilateral upper extremities grip strength appears to be approximately equal bilaterally. Extremities: Symmetric 5 x 5 power. Skin: No rashes, lesions or ulcers Psychiatry: Judgement and insight appear normal. Mood & affect appropriate.     Data Reviewed: I have personally reviewed following labs and imaging studies  CBC: Recent Labs  Lab 01/29/18 1941  WBC 6.1  NEUTROABS 3.1  HGB 12.4  HCT 37.8  MCV 88.9  PLT 288   Basic Metabolic Panel: Recent Labs  Lab 01/29/18 1941 01/31/18 0133 02/01/18 0402  NA 138 138 137  K 4.1 3.8 4.1  CL 104 104 102  CO2 24 23 24   GLUCOSE 97 129* 118*  BUN 12 9 19   CREATININE 0.99 0.95 1.04*  CALCIUM 9.0 9.4 8.8*   GFR: Estimated Creatinine Clearance: 114.5 mL/min (A) (by C-G formula based on SCr of 1.04 mg/dL (H)). Liver Function Tests: Recent Labs  Lab 01/29/18 1941  AST 16  ALT 18  ALKPHOS 59  BILITOT 0.5  PROT 7.2  ALBUMIN 3.6   No results for input(s): LIPASE, AMYLASE in the last 168 hours. No results for input(s): AMMONIA in the last 168 hours. Coagulation Profile: No results for input(s): INR, PROTIME in the last  168 hours. Cardiac Enzymes: No results for input(s): CKTOTAL, CKMB, CKMBINDEX, TROPONINI in the last 168 hours. BNP (last 3 results) No results for input(s): PROBNP in the last 8760 hours. HbA1C: No results for input(s): HGBA1C in the last 72 hours. CBG: Recent Labs  Lab 01/31/18 1715 01/31/18 2046 02/01/18 0623 02/01/18 1124 02/01/18 1609  GLUCAP 127* 144* 115* 143* 130*   Lipid Profile: No results for input(s): CHOL, HDL, LDLCALC, TRIG, CHOLHDL, LDLDIRECT in the last 72 hours. Thyroid Function Tests: No  results for input(s): TSH, T4TOTAL, FREET4, T3FREE, THYROIDAB in the last 72 hours. Anemia Panel: No results for input(s): VITAMINB12, FOLATE, FERRITIN, TIBC, IRON, RETICCTPCT in the last 72 hours. Sepsis Labs: No results for input(s): PROCALCITON, LATICACIDVEN in the last 168 hours.  No results found for this or any previous visit (from the past 240 hour(s)).       Radiology Studies: No results found.      Scheduled Meds: . enoxaparin (LOVENOX) injection  40 mg Subcutaneous Daily  . insulin aspart  0-9 Units Subcutaneous TID WC  . pantoprazole  40 mg Oral Daily   Continuous Infusions: . methylPREDNISolone (SOLU-MEDROL) injection 1,000 mg (02/01/18 0700)     LOS: 2 days    Time spent: 30 minutes   Author:  Lynden Oxford, MD [facility] Pager: [phone] 02/01/2018 4:52 PM     If 7PM-7AM, please contact night-coverage [email or web address] Password [facility] 02/01/2018, 4:52 PM

## 2018-02-01 NOTE — Progress Notes (Signed)
                      NEURO HOSPITALIST PROGRESS NOTE   Subjective: Patient states she is feeling better, but has some insomnia (d/t steroids). No family here, she tells me she is ready to go home to her babies (age 34 and 2).  Exam: Vitals:   02/01/18 0620 02/01/18 1318  BP: 103/67 103/73  Pulse: 67 77  Resp:  20  Temp: 97.9 F (36.6 C) 98.1 F (36.7 C)  SpO2: 98% 98%    Physical Exam  Gen- no acute distress, obese HEENT-  Normocephalic, no lesions, without obvious abnormality.  Normal external eye and conjunctiva.   Cardiovascular- S1-S2 audible, pulses palpable throughout   Lungs-no rhonchi or wheezing noted, no excessive working breathing.  Saturations within normal limits on RA Abdomen- All 4 quadrants palpated and nontender Extremities- Warm, dry and intact Musculoskeletal-no joint tenderness, deformity or swelling Skin-warm and dry, no hyperpigmentation, vitiligo, or suspicious lesions   Neuro:  Mental Status: Alert, oriented, thought content appropriate.  Speech fluent without evidence of aphasia.  Able to follow  commands without difficulty. Cranial Nerves: II: Visual fields grossly normal,  III,IV, VI: ptosis not present, extra-ocular motions intact bilaterally pupils equal, round, reactive to light and accommodation V,VII: smile symmetric, facial light touch sensation normal bilaterally VIII: hearing normal bilaterally IX,X: uvula rises midline XI: bilateral shoulder shrug XII: midline tongue extension Motor: Right : Upper extremity   5/5  Left:     Upper extremity   5/5  Lower extremity   5/5   Lower extremity   5/5 Tone and bulk:normal tone throughout; no atrophy noted Sensory: temp sensation decreased on left hand and arm, equal  Deep Tendon Reflexes: 2+ and symmetric biceps, 3+ patella, 2+ achilles  Plantars: Right: downgoing   Left: downgoing Cerebellar: normal finger-to-nose and normal heel-to-shin test Gait: normal gait and station; patient ambulating  in room.     Medications:  Scheduled: . enoxaparin (LOVENOX) injection  40 mg Subcutaneous Daily  . insulin aspart  0-9 Units Subcutaneous TID WC  . pantoprazole  40 mg Oral Daily   Continuous: . methylPREDNISolone (SOLU-MEDROL) injection 1,000 mg (02/01/18 0700)   EHU:[id number] **OR** acetaminophen, hydrALAZINE, ipratropium-albuterol, ondansetron **OR** ondansetron (ZOFRAN) IV  Pertinent Labs/Diagnostics:   No results found. Assessment: 32 year old female with new right sided weakness, imaging and clinical history very consistent with MS. At this point, no other diagnostic testing is needed at this time.   Recommendations: 1. IV Solumedrol 1000 mg qd x 5 days. Dose 3/5 2. Monitor CBG closely while on pulsed dose steroids 3. Outpatient follow up with Dr. Epimenio Foot in 2-4 weeks' time. 4. We will f/u next on Monday  Amanda Magaw Metzger-Cihelka, MSN, ARNP-C, ANVP-BC [facility]   If 7pm- 7am, please page neurology on call as listed in AMION.

## 2018-02-02 LAB — COMPREHENSIVE METABOLIC PANEL
ALT: 15 U/L (ref 0–44)
ANION GAP: 9 (ref 5–15)
AST: 12 U/L — ABNORMAL LOW (ref 15–41)
Albumin: 3.2 g/dL — ABNORMAL LOW (ref 3.5–5.0)
Alkaline Phosphatase: 51 U/L (ref 38–126)
BUN: 19 mg/dL (ref 6–20)
CO2: 29 mmol/L (ref 22–32)
Calcium: 8.6 mg/dL — ABNORMAL LOW (ref 8.9–10.3)
Chloride: 102 mmol/L (ref 98–111)
Creatinine, Ser: 1.07 mg/dL — ABNORMAL HIGH (ref 0.44–1.00)
GFR calc Af Amer: >60 mL/min (ref >60)
GFR calc non Af Amer: >60 mL/min (ref >60)
Glucose, Bld: 114 mg/dL — ABNORMAL HIGH (ref 70–99)
POTASSIUM: 3.7 mmol/L (ref 3.5–5.1)
Sodium: 140 mmol/L (ref 135–145)
Total Bilirubin: 0.3 mg/dL (ref 0.3–1.2)
Total Protein: 6.7 g/dL (ref 6.5–8.1)

## 2018-02-02 LAB — CBC WITH DIFFERENTIAL/PLATELET
Abs Immature Granulocytes: 0.21 10*3/uL — ABNORMAL HIGH (ref 0.00–0.07)
Basophils Absolute: 0 10*3/uL (ref 0.0–0.1)
Basophils Relative: 0 %
EOS ABS: 0 10*3/uL (ref 0.0–0.5)
Eosinophils Relative: 0 %
HCT: 36.4 % (ref 36.0–46.0)
Hemoglobin: 12 g/dL (ref 12.0–15.0)
Immature Granulocytes: 1 %
Lymphocytes Relative: 8 %
Lymphs Abs: 1.2 10*3/uL (ref 0.7–4.0)
MCH: 29.3 pg (ref 26.0–34.0)
MCHC: 33 g/dL (ref 30.0–36.0)
MCV: 88.8 fL (ref 80.0–100.0)
Monocytes Absolute: 1 10*3/uL (ref 0.1–1.0)
Monocytes Relative: 7 %
Neutro Abs: 13.5 10*3/uL — ABNORMAL HIGH (ref 1.7–7.7)
Neutrophils Relative %: 84 %
Platelets: 317 10*3/uL (ref 150–400)
RBC: 4.1 MIL/uL (ref 3.87–5.11)
RDW: 12.5 % (ref 11.5–15.5)
WBC: 15.9 10*3/uL — ABNORMAL HIGH (ref 4.0–10.5)
nRBC: 0 % (ref 0.0–0.2)

## 2018-02-02 LAB — GLUCOSE, CAPILLARY
Glucose-Capillary: 102 mg/dL — ABNORMAL HIGH (ref 70–99)
Glucose-Capillary: 124 mg/dL — ABNORMAL HIGH (ref 70–99)
Glucose-Capillary: 122 mg/dL — ABNORMAL HIGH (ref 70–99)
Glucose-Capillary: 120 mg/dL — ABNORMAL HIGH (ref 70–99)

## 2018-02-02 MED ORDER — SODIUM CHLORIDE 0.9 % IV SOLN
1000.0000 mg | Freq: Every day | INTRAVENOUS | Status: AC
  Administered 2018-02-03: 1000 mg via INTRAVENOUS
  Filled 2018-02-02: qty 8

## 2018-02-02 NOTE — Progress Notes (Signed)
PROGRESS NOTE    Amanda SkeensMarinda Wise  WUJ:[id number] DOB: [birth year] DOA: 01/29/2018 PCP: Patient, No Pcp Per   Brief Narrative:  Patient is a 32 year old female with no significant past medical history who presented with decrease sensation on the right hand and paresthesias on all of her extremities.  Neuro imaging showed multiple sclerosis.  Neurology was following.  Started on high-dose steroids.  Last dose tomorrow.   Assessment & Plan:   Active Problems:   Multiple sclerosis (HCC)   Numbness  New diagnosis of multiple sclerosis: Clinically improving with high-dose methylprednisolone.  Day 4/5.  Last dose tomorrow.  She will follow-up with neurology, Dr. Epimenio FootSater, after discharge. No focal neurological deficits.  She complains of some tingling and numbness on her bilateral fingers.  Does not have any problem with ambulation.  Leukocytosis: Associated with steroids.  Continue to monitor the trend.  Morbid obesity: BMI of 49.4 noted.        DVT prophylaxis:Lovenox Code Status: Full Family Communication: None present at the bedside Disposition Plan: Home tomorrow   Consultants: Neurology  Procedures: None  Antimicrobials: None  Subjective: Patient seen and examined at bedside this afternoon.  Hemodynamically stable.  Comfortable.  Still complaining of some numbness and tingling sensation on bilateral fingers.  No focal logical deficits.  Objective: Vitals:   02/01/18 0620 02/01/18 1318 02/01/18 1923 02/02/18 0347  BP: 103/67 103/73 (!) 142/91 (!) 128/91  Pulse: 67 77 73 (!) 53  Resp:  20    Temp: 97.9 F (36.6 C) 98.1 F (36.7 C) 98 F (36.7 C) 97.6 F (36.4 C)  TempSrc: Oral Oral Oral Oral  SpO2: 98% 98% 97% 99%  Weight:      Height:        Intake/Output Summary (Last 24 hours) at 02/02/2018 1406 Last data filed at 02/02/2018 1300 Gross per 24 hour  Intake 840 ml  Output -  Net 840 ml   Filed Weights   01/30/18 0459  Weight: (!) 143.3 kg     Examination:  General exam: Appears calm and comfortable ,Not in distress,average built HEENT:PERRL,Oral mucosa moist, Ear/Nose normal on gross exam Respiratory system: Bilateral equal air entry, normal vesicular breath sounds, no wheezes or crackles  Cardiovascular system: S1 & S2 heard, RRR. No JVD, murmurs, rubs, gallops or clicks. No pedal edema. Gastrointestinal system: Abdomen is nondistended, soft and nontender. No organomegaly or masses felt. Normal bowel sounds heard. Central nervous system: Alert and oriented. No focal neurological deficits.  Feeling of tingling on bilateral fingers.  Sensation intact on examination. Extremities: No edema, no clubbing ,no cyanosis, distal peripheral pulses palpable. Skin: No rashes, lesions or ulcers,no icterus ,no pallor MSK: Normal muscle bulk,tone ,power Psychiatry: Judgement and insight appear normal. Mood & affect appropriate.     Data Reviewed: I have personally reviewed following labs and imaging studies  CBC: Recent Labs  Lab 01/29/18 1941 02/02/18 0625  WBC 6.1 15.9*  NEUTROABS 3.1 13.5*  HGB 12.4 12.0  HCT 37.8 36.4  MCV 88.9 88.8  PLT 288 317   Basic Metabolic Panel: Recent Labs  Lab 01/29/18 1941 01/31/18 0133 02/01/18 0402 02/02/18 0625  NA 138 138 137 140  K 4.1 3.8 4.1 3.7  CL 104 104 102 102  CO2 24 23 24 29   GLUCOSE 97 129* 118* 114*  BUN 12 9 19 19   CREATININE 0.99 0.95 1.04* 1.07*  CALCIUM 9.0 9.4 8.8* 8.6*   GFR: Estimated Creatinine Clearance: 111.3 mL/min (A) (by C-G formula based on  SCr of 1.07 mg/dL (H)). Liver Function Tests: Recent Labs  Lab 01/29/18 1941 02/02/18 0625  AST 16 12*  ALT 18 15  ALKPHOS 59 51  BILITOT 0.5 0.3  PROT 7.2 6.7  ALBUMIN 3.6 3.2*   No results for input(s): LIPASE, AMYLASE in the last 168 hours. No results for input(s): AMMONIA in the last 168 hours. Coagulation Profile: No results for input(s): INR, PROTIME in the last 168 hours. Cardiac Enzymes: No  results for input(s): CKTOTAL, CKMB, CKMBINDEX, TROPONINI in the last 168 hours. BNP (last 3 results) No results for input(s): PROBNP in the last 8760 hours. HbA1C: No results for input(s): HGBA1C in the last 72 hours. CBG: Recent Labs  Lab 02/01/18 1124 02/01/18 1609 02/01/18 2127 02/02/18 0611 02/02/18 1102  GLUCAP 143* 130* 125* 102* 124*   Lipid Profile: No results for input(s): CHOL, HDL, LDLCALC, TRIG, CHOLHDL, LDLDIRECT in the last 72 hours. Thyroid Function Tests: No results for input(s): TSH, T4TOTAL, FREET4, T3FREE, THYROIDAB in the last 72 hours. Anemia Panel: No results for input(s): VITAMINB12, FOLATE, FERRITIN, TIBC, IRON, RETICCTPCT in the last 72 hours. Sepsis Labs: No results for input(s): PROCALCITON, LATICACIDVEN in the last 168 hours.  No results found for this or any previous visit (from the past 240 hour(s)).       Radiology Studies: No results found.      Scheduled Meds: . enoxaparin (LOVENOX) injection  40 mg Subcutaneous Daily  . insulin aspart  0-9 Units Subcutaneous TID WC  . pantoprazole  40 mg Oral Daily   Continuous Infusions: . methylPREDNISolone (SOLU-MEDROL) injection 1,000 mg (02/02/18 0519)  . [START ON 02/03/2018] methylPREDNISolone (SOLU-MEDROL) injection       LOS: 3 days    Time spent: 35  Mins.More than 50% of that time was spent in counseling and/or coordination of care.      Burnadette Pop, MD [facility] Pager [phone]  If 7PM-7AM, please contact night-coverage [email or web address] Password [email or web address] 02/02/2018, 2:06 PM

## 2018-02-03 LAB — CBC WITH DIFFERENTIAL/PLATELET
Abs Immature Granulocytes: 0.29 10*3/uL — ABNORMAL HIGH (ref 0.00–0.07)
Basophils Absolute: 0 10*3/uL (ref 0.0–0.1)
Basophils Relative: 0 %
Eosinophils Absolute: 0 10*3/uL (ref 0.0–0.5)
Eosinophils Relative: 0 %
HCT: 36.8 % (ref 36.0–46.0)
Hemoglobin: 12.3 g/dL (ref 12.0–15.0)
Immature Granulocytes: 2 %
Lymphocytes Relative: 10 %
Lymphs Abs: 1.3 10*3/uL (ref 0.7–4.0)
MCH: 29.2 pg (ref 26.0–34.0)
MCHC: 33.4 g/dL (ref 30.0–36.0)
MCV: 87.4 fL (ref 80.0–100.0)
Monocytes Absolute: 1.3 10*3/uL — ABNORMAL HIGH (ref 0.1–1.0)
Monocytes Relative: 10 %
Neutro Abs: 11 10*3/uL — ABNORMAL HIGH (ref 1.7–7.7)
Neutrophils Relative %: 78 %
Platelets: 300 10*3/uL (ref 150–400)
RBC: 4.21 MIL/uL (ref 3.87–5.11)
RDW: 12.3 % (ref 11.5–15.5)
WBC: 13.9 10*3/uL — AB (ref 4.0–10.5)
nRBC: 0 % (ref 0.0–0.2)

## 2018-02-03 LAB — GLUCOSE, CAPILLARY
Glucose-Capillary: 99 mg/dL (ref 70–99)
Glucose-Capillary: 132 mg/dL — ABNORMAL HIGH (ref 70–99)

## 2018-02-03 NOTE — Progress Notes (Signed)
Patient discharging home. Discharge instructions explained to patient and she verbalized understanding. Took all personal belongings. No further questions or concerns voiced.  

## 2018-02-03 NOTE — Progress Notes (Signed)
NEUROLOGY PROGRESS NOTE  Subjective: Currently patient is feeling significantly improved.  Patient does have some tingling in her right thumb but feels as though her strength has improved significantly and she is able to write at this time.  She does have an appointment for Dr. Epimenio Foot as an outpatient as this is her first flareup.  Exam: Vitals:   02/02/18 1920 02/03/18 0247  BP: 130/81 (!) 137/93  Pulse: 80 72  Resp:    Temp: 98.4 F (36.9 C) (!) 97.5 F (36.4 C)  SpO2: 99% 96%    Physical Exam   HEENT-  Normocephalic, no lesions, without obvious abnormality.  Normal external eye and conjunctiva.   Extremities- Warm, dry and intact Musculoskeletal-no joint tenderness, deformity or swelling Skin-warm and dry, no hyperpigmentation, vitiligo, or suspicious lesions    Neuro:  Mental Status: Alert, oriented, thought content appropriate.  Speech fluent without evidence of aphasia.  Able to follow 3 step commands without difficulty. Cranial Nerves: II:  Visual fields grossly normal,  III,IV, VI: ptosis not present, extra-ocular motions intact bilaterally pupils equal, round, reactive to light and accommodation V,VII: smile symmetric, facial light touch sensation normal bilaterally VIII: hearing normal bilaterally IX,X: uvula rises midline XI: bilateral shoulder shrug XII: midline tongue extension Motor: Right : Upper extremity   5/5    Left:     Upper extremity   5/5  Lower extremity   5/5     Lower extremity   5/5 Tone and bulk:normal tone throughout; no atrophy noted Sensory: Subjective numbness on the right thumb and subjectively feels as though her hand is not back to normal but improved significantly.  Has light touch and pinprick throughout her right hand.  Otherwise sensation intact throughout Deep Tendon Reflexes: 2+ and symmetric throughout with 3+ knee jerk and 2+ Achilles Plantars: Right: downgoing   Left: downgoing Cerebellar: normal finger-to-nose, normal rapid  alternating movements and normal heel-to-shin test Gait: normal gait and station    Medications:  Scheduled: . enoxaparin (LOVENOX) injection  40 mg Subcutaneous Daily  . insulin aspart  0-9 Units Subcutaneous TID WC  . pantoprazole  40 mg Oral Daily   Continuous:  EVO:[id number] **OR** acetaminophen, hydrALAZINE, ipratropium-albuterol, ondansetron **OR** ondansetron (ZOFRAN) IV   Assessment: 32 year old female with right-sided weakness mostly in her hand and imaging consistent with MS.  Patient has received her full 5 doses of Solu-Medrol.   Recommendations: At this point patient needs outpatient with Dr. Epimenio Foot   No further recommendations per neurology.  Thank you for this consultation.  Neurology will sign off at this time.  Felicie Morn PA-C [facility] [phone]  02/03/2018, 8:45 AM

## 2018-02-03 NOTE — Progress Notes (Signed)
Occupational Therapy Treatment and Discharge Patient Details Name: Amanda Wise MRN: [id number] DOB: [birth year] Today's Date: 02/03/2018    History of present illness Amanda Wise is a 32 y.o. female with medical history significant of HTN. Pt has been experiencing decreased sensation to R arm and L hand.  Has been having paresthesias / numbness to B legs for several weeks. 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   OT comments  Pt able to demonstrate HEP at independent level as well as transfers and dressing. OT encouraged joining a support group and looking at credible sources for continuing education on MS. OT education complete, goals met. OT to sign off at this time. Thank you for the opportunity to serve this patient.    Follow Up Recommendations  No OT follow up    Equipment Recommendations  None recommended by OT    Recommendations for Other Services Other (comment)(follow up and join a support group)    Precautions / Restrictions Precautions Precautions: None Restrictions Weight Bearing Restrictions: No       Mobility Bed Mobility Overal bed mobility: Independent                Transfers Overall transfer level: Independent                    Balance Overall balance assessment: Independent                                         ADL either performed or assessed with clinical judgement   ADL Overall ADL's : Independent                 Upper Body Dressing : Independent   Lower Body Dressing: Independent   Toilet Transfer: Independent           Functional mobility during ADLs: Independent       Vision       Perception     Praxis      Cognition Arousal/Alertness: Awake/alert Behavior During Therapy: WFL for tasks assessed/performed Overall Cognitive Status: Within Functional Limits for tasks assessed                                          Exercises Exercises: Other exercises Other Exercises Other Exercises: theraputty exercises with tan (soft) putty provided Other Exercises: Pt performed snaps on hospital gown x10   Shoulder Instructions       General Comments reenforced use fo support groups, continuing to educate herself on MS from reliable/credible sources    Pertinent Vitals/ Pain       Pain Assessment: No/denies pain  Home Living                                          Prior Functioning/Environment              Frequency  Min 2X/week        Progress Toward Goals  OT Goals(current goals can now be found in the care plan section)  Progress towards OT goals: Goals met/education completed, patient discharged from OT  Acute Rehab OT Goals Patient Stated Goal: "  to be independent and stay independent for my kids" OT Goal Formulation: With patient Time For Goal Achievement: 02/13/18 Potential to Achieve Goals: Good  Plan Discharge plan remains appropriate;Frequency remains appropriate    Co-evaluation                 AM-PAC OT "6 Clicks" Daily Activity     Outcome Measure   Help from another person eating meals?: None Help from another person taking care of personal grooming?: None Help from another person toileting, which includes using toliet, bedpan, or urinal?: None Help from another person bathing (including washing, rinsing, drying)?: None Help from another person to put on and taking off regular upper body clothing?: None Help from another person to put on and taking off regular lower body clothing?: None 6 Click Score: 24    End of Session    OT Visit Diagnosis: Muscle weakness (generalized) (M62.81);Other symptoms and signs involving the nervous system (R29.898)   Activity Tolerance Patient tolerated treatment well   Patient Left Other (comment)(in bathroom)   Nurse Communication Mobility status        Time: 3358-2518 OT Time  Calculation (min): 16 min  Charges: OT General Charges $OT Visit: 1 Visit OT Treatments $Therapeutic Activity: 8-22 mins  Hulda Humphrey OTR/L Acute Rehabilitation Services Pager: [phone] Office: [place] 02/03/2018, 12:28 PM

## 2018-02-05 NOTE — Discharge Summary (Signed)
[facility] Discharge Summary   Patient: Amanda SkeensMarinda Kouns ZOX:[id number]   PCP: Patient, No Pcp Per DOB: 03-10-1985   Date of admission: 01/29/2018   Date of discharge: 02/03/2018   Discharge Diagnoses:  Principal diagnosis Newly diagnosed MS   Active Problems:   Multiple sclerosis (HCC)   Numbness   Admitted From: home Disposition:  home  Recommendations for Outpatient Follow-up:  1. Please follow up with neurology as recommended    Follow-up Information    Sater, Pearletha Furlichard A, MD. Schedule an appointment as soon as possible for a visit in 2 week(s).   Specialty:  Neurology Contact information: [address] [place] [address] [phone]          Diet recommendation: home Activity: The patient is advised to gradually reintroduce usual activities.  Discharge Condition: good  Code Status: full code  History of present illness: As per the H and P dictated on admission, "Amanda SkeensMarinda Obeirne is a 33 y.o. female with medical history significant of HTN.  2 days ago when patient woke up she noticed decreased sensation to R arm.  Today numbness to L hand.  Has been having paresthesias / numbness to B legs for several weeks.  No head or neck trauma recently, no abd pain, no CP, no SOB.   ED Course: MRI w and w/o contrast confirms hyperacute, acute, and chronic demylinating disease in brain and C spine."  Hospital Course:  Summary of her active problems in the hospital is as following. 1. New diagnosis of multiple sclerosis with flare.   She is clinically improving  Treated with 1 g IV Solu-Medrol for 5 days. No further steroids recommended by neurology on discharge. Recommend outpatient follow-up.  2. Morbid obesity Body mass index is 49.48 kg/m.  Counseled for weight loss.  All other chronic medical condition were stable during the hospitalization.  Patient was seen by physical therapy, who recommended no PT follow up needed. On the day of the discharge  the patient's vitals were stable , and no other acute medical condition were reported by patient. the patient was felt safe to be discharge at home with family.  Consultants: none Procedures: noen  DISCHARGE MEDICATION: Allergies as of 02/03/2018   No Known Allergies     Medication List    STOP taking these medications   methocarbamol 500 MG tablet Commonly known as:  ROBAXIN   OVER THE COUNTER MEDICATION     TAKE these medications   GOODY HEADACHE PO Take 1 packet by mouth 2 (two) times daily as needed (pain/headache).   ibuprofen 600 MG tablet Commonly known as:  ADVIL,MOTRIN Take 1 tablet (600 mg total) by mouth every 8 (eight) hours as needed. Take with food. What changed:  Another medication with the same name was removed. Continue taking this medication, and follow the directions you see here.   MAGNESIUM PO Take 3 tablets by mouth daily after breakfast.      No Known Allergies Discharge Instructions    Diet - low sodium heart healthy   Complete by:  As directed    Discharge instructions   Complete by:  As directed    It is important that you read following instructions as well as go over your medication list with RN to help you understand your care after this hospitalization.  Discharge Instructions: Please follow-up with PCP in one week  Please request your primary care physician to go over all Hospital Tests and Procedure/Radiological results at the follow up,  Please get  all Hospital records sent to your PCP by signing hospital release before you go home.   Do not take more than prescribed Pain, Sleep and Anxiety Medications. You were cared for by a hospitalist during your hospital stay. If you have any questions about your discharge medications or the care you received while you were in the hospital after you are discharged, you can call the unit you were admitted to and ask to speak with the hospitalist on call if the hospitalist that took care of you is  not available.  Once you are discharged, your primary care physician will handle any further medical issues. Please note that NO REFILLS for any discharge medications will be authorized once you are discharged, as it is imperative that you return to your primary care physician (or establish a relationship with a primary care physician if you do not have one) for your aftercare needs so that they can reassess your need for medications and monitor your lab values. You Must read complete instructions/literature along with all the possible adverse reactions/side effects for all the Medicines you take and that have been prescribed to you. Take any new Medicines after you have completely understood and accept all the possible adverse reactions/side effects. Wear Seat belts while driving. If you have smoked or chewed Tobacco in the last 2 yrs please stop smoking and/or stop any Recreational drug use.   Increase activity slowly   Complete by:  As directed      Discharge Exam: Filed Weights   01/30/18 0459  Weight: (!) 143.3 kg   Vitals:   02/02/18 1920 02/03/18 0247  BP: 130/81 (!) 137/93  Pulse: 80 72  Resp:    Temp: 98.4 F (36.9 C) (!) 97.5 F (36.4 C)  SpO2: 99% 96%   General: Appear in no distress, no Rash; Oral Mucosa moist. Cardiovascular: S1 and S2 Present, no Murmur, no JVD Respiratory: Bilateral Air entry present and Clear to Auscultation, no Crackles, no wheezes Abdomen: Bowel Sound present, Soft and no tenderness Extremities: no Pedal edema, no calf tenderness Neurology: Grossly no focal neuro deficit.  The results of significant diagnostics from this hospitalization (including imaging, microbiology, ancillary and laboratory) are listed below for reference.    Significant Diagnostic Studies: Dg Chest 2 View  Result Date: 01/22/2018 CLINICAL DATA:  Chest pain, onset yesterday but progressive. Shortness of breath. EXAM: CHEST - 2 VIEW COMPARISON:  Radiograph 02/13/2014  FINDINGS: The cardiomediastinal contours are normal. The lungs are clear. Pulmonary vasculature is normal. No consolidation, pleural effusion, or pneumothorax. No acute osseous abnormalities are seen. IMPRESSION: No acute pulmonary process. Electronically Signed   By: Narda Rutherford M.D.   On: 01/22/2018 00:05   Mr Laqueta Jean And Wo Contrast  Result Date: 01/30/2018 CLINICAL DATA:  Two days of RIGHT arm numbness now in LEFT hand. Lower extremity paresthesias for several weeks. Assess stroke versus demyelination. History of hypertension. EXAM: MRI HEAD WITHOUT AND WITH CONTRAST MRI CERVICAL SPINE WITHOUT AND WITH CONTRAST TECHNIQUE: Multiplanar, multiecho pulse sequences of the brain and surrounding structures, and cervical spine, to include the craniocervical junction and cervicothoracic junction, were obtained without and with intravenous contrast. CONTRAST:  10 cc Gadavist COMPARISON:  None. FINDINGS: MRI HEAD FINDINGS INTRACRANIAL CONTENTS: Punctate reduced diffusion LEFT periatrial white matter with low ADC values favoring hyperacute demyelination. Greater then 10 supratentorial white matter lesions with low T1 signal and T2 shine through, many of which radiate from the periventricular margin. Dominant lesion RIGHT frontal lobe measuring 11  mm. Juxtacortical and bifrontal cortical lesions. At least 6 infratentorial lesions. Faint enhancement of RIGHT pontine, LEFT periatrial, RIGHT parietal, RIGHT frontal juxta cortical lesion. No susceptibility artifact to suggest hemorrhage. The ventricles and sulci are normal for patient's age. No masses or mass effect. No abnormal intraparenchymal or extra-axial enhancement. No abnormal extra-axial fluid collections. No extra-axial masses. 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 VASCULAR: Normal major intracranial vascular flow voids present at skull base. SKULL AND UPPER CERVICAL SPINE: No abnormal sellar expansion. No suspicious calvarial  bone marrow signal. Craniocervical junction maintained. SINUSES/ORBITS: Mild paranasal sinus mucosal thickening, lobulated maxillary component. Mastoid air cells are well aerated.The included ocular globes and orbital contents are non-suspicious. OTHER: None. MRI CERVICAL SPINE FINDINGS ALIGNMENT: Straightened cervical lordosis.  No malalignment. VERTEBRAE/DISCS: Vertebral bodies are intact. Intervertebral disc morphology's and signal are normal. No acute or abnormal bone marrow signal. No abnormal osseous or disc enhancement. CORD:At least 4 spinal cord lesions identified, dominant expansile lesion is 17 mm in craniocaudad dimension at C6-7 within RIGHT hemicord with enhancement. Subcentimeter lesions at C2-3 with faint enhancement. No myelomalacia or syrinx. POSTERIOR FOSSA, VERTEBRAL ARTERIES, PARASPINAL TISSUES: No MR findings of ligamentous injury. Vertebral artery flow voids present. Included posterior fossa and paraspinal soft tissues are normal. DISC LEVELS: C2-3 through C4-5: No disc bulge, canal stenosis nor neural foraminal narrowing. C5-6: Small central disc protrusion. Mild canal stenosis without neural foraminal narrowing. C6-7 and C7-T1: No disc bulge, canal stenosis nor neural foraminal narrowing. IMPRESSION: MRI HEAD: 1. Severe demyelination with multiple supra-and infratentorial lesions varying from hyperacute to acute and chronic. 2. No parenchymal brain volume loss for age. 3. 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 MRI CERVICAL SPINE: 1. Spinal cord demyelination with dominant 17 mm acute enhancing lesion at C6-7. No myelomalacia. 2. Mild canal stenosis C5-6. Acute findings discussed with and reconfirmed by Dr.STEPHEN RANCOUR on 01/30/2018 at 2:33 am. Electronically Signed   By: Awilda Metro M.D.   On: 01/30/2018 02:33   Mr Cervical Spine W Wo Contrast  Result Date: 01/30/2018 CLINICAL DATA:  Two days of RIGHT arm numbness now in LEFT hand. Lower extremity  paresthesias for several weeks. Assess stroke versus demyelination. History of hypertension. EXAM: MRI HEAD WITHOUT AND WITH CONTRAST MRI CERVICAL SPINE WITHOUT AND WITH CONTRAST TECHNIQUE: Multiplanar, multiecho pulse sequences of the brain and surrounding structures, and cervical spine, to include the craniocervical junction and cervicothoracic junction, were obtained without and with intravenous contrast. CONTRAST:  10 cc Gadavist COMPARISON:  None. FINDINGS: MRI HEAD FINDINGS INTRACRANIAL CONTENTS: Punctate reduced diffusion LEFT periatrial white matter with low ADC values favoring hyperacute demyelination. Greater then 10 supratentorial white matter lesions with low T1 signal and T2 shine through, many of which radiate from the periventricular margin. Dominant lesion RIGHT frontal lobe measuring 11 mm. Juxtacortical and bifrontal cortical lesions. At least 6 infratentorial lesions. Faint enhancement of RIGHT pontine, LEFT periatrial, RIGHT parietal, RIGHT frontal juxta cortical lesion. No susceptibility artifact to suggest hemorrhage. The ventricles and sulci are normal for patient's age. No masses or mass effect. No abnormal intraparenchymal or extra-axial enhancement. No abnormal extra-axial fluid collections. No extra-axial masses. 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 VASCULAR: Normal major intracranial vascular flow voids present at skull base. SKULL AND UPPER CERVICAL SPINE: No abnormal sellar expansion. No suspicious calvarial bone marrow signal. Craniocervical junction maintained. SINUSES/ORBITS: Mild paranasal sinus mucosal thickening, lobulated maxillary component. Mastoid air cells are well aerated.The included ocular globes and orbital contents are  non-suspicious. OTHER: None. MRI CERVICAL SPINE FINDINGS ALIGNMENT: Straightened cervical lordosis.  No malalignment. VERTEBRAE/DISCS: Vertebral bodies are intact. Intervertebral disc morphology's and signal are normal. No  acute or abnormal bone marrow signal. No abnormal osseous or disc enhancement. CORD:At least 4 spinal cord lesions identified, dominant expansile lesion is 17 mm in craniocaudad dimension at C6-7 within RIGHT hemicord with enhancement. Subcentimeter lesions at C2-3 with faint enhancement. No myelomalacia or syrinx. POSTERIOR FOSSA, VERTEBRAL ARTERIES, PARASPINAL TISSUES: No MR findings of ligamentous injury. Vertebral artery flow voids present. Included posterior fossa and paraspinal soft tissues are normal. DISC LEVELS: C2-3 through C4-5: No disc bulge, canal stenosis nor neural foraminal narrowing. C5-6: Small central disc protrusion. Mild canal stenosis without neural foraminal narrowing. C6-7 and C7-T1: No disc bulge, canal stenosis nor neural foraminal narrowing. IMPRESSION: MRI HEAD: 1. Severe demyelination with multiple supra-and infratentorial lesions varying from hyperacute to acute and chronic. 2. No parenchymal brain volume loss for age. 3. 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 MRI CERVICAL SPINE: 1. Spinal cord demyelination with dominant 17 mm acute enhancing lesion at C6-7. No myelomalacia. 2. Mild canal stenosis C5-6. Acute findings discussed with and reconfirmed by Dr.STEPHEN RANCOUR on 01/30/2018 at 2:33 am. Electronically Signed   By: Awilda Metroourtnay  Bloomer M.D.   On: 01/30/2018 02:33    Microbiology: No results found for this or any previous visit (from the past 240 hour(s)).   Labs: CBC: Recent Labs  Lab 01/29/18 1941 02/02/18 0625 02/03/18 0257  WBC 6.1 15.9* 13.9*  NEUTROABS 3.1 13.5* 11.0*  HGB 12.4 12.0 12.3  HCT 37.8 36.4 36.8  MCV 88.9 88.8 87.4  PLT 288 317 300   Basic Metabolic Panel: Recent Labs  Lab 01/29/18 1941 01/31/18 0133 02/01/18 0402 02/02/18 0625  NA 138 138 137 140  K 4.1 3.8 4.1 3.7  CL 104 104 102 102  CO2 24 23 24 29   GLUCOSE 97 129* 118* 114*  BUN 12 9 19 19   CREATININE 0.99 0.95 1.04* 1.07*  CALCIUM 9.0 9.4 8.8* 8.6*     Liver Function Tests: Recent Labs  Lab 01/29/18 1941 02/02/18 0625  AST 16 12*  ALT 18 15  ALKPHOS 59 51  BILITOT 0.5 0.3  PROT 7.2 6.7  ALBUMIN 3.6 3.2*   No results for input(s): LIPASE, AMYLASE in the last 168 hours. No results for input(s): AMMONIA in the last 168 hours. Cardiac Enzymes: No results for input(s): CKTOTAL, CKMB, CKMBINDEX, TROPONINI in the last 168 hours. BNP (last 3 results) No results for input(s): BNP in the last 8760 hours. CBG: Recent Labs  Lab 02/02/18 1102 02/02/18 1628 02/02/18 2135 02/03/18 0631 02/03/18 1116  GLUCAP 124* 122* 120* 99 132*   Time spent: 35 minutes  Signed:  Lynden OxfordPranav Bernette Seeman  [facility] 02/03/2018 , 6:01 AM

## 2018-02-19 ENCOUNTER — Ambulatory Visit (INDEPENDENT_AMBULATORY_CARE_PROVIDER_SITE_OTHER): Payer: BLUE CROSS/BLUE SHIELD | Admitting: Neurology

## 2018-02-19 ENCOUNTER — Encounter: Payer: Self-pay | Admitting: Neurology

## 2018-02-19 ENCOUNTER — Telehealth: Payer: Self-pay | Admitting: *Deleted

## 2018-02-19 VITALS — BP 125/70 | HR 86 | Ht 67.0 in | Wt 327.0 lb

## 2018-02-19 DIAGNOSIS — Z79899 Other long term (current) drug therapy: Secondary | ICD-10-CM

## 2018-02-19 DIAGNOSIS — G35 Multiple sclerosis: Secondary | ICD-10-CM

## 2018-02-19 DIAGNOSIS — R3915 Urgency of urination: Secondary | ICD-10-CM

## 2018-02-19 DIAGNOSIS — E559 Vitamin D deficiency, unspecified: Secondary | ICD-10-CM | POA: Diagnosis not present

## 2018-02-19 DIAGNOSIS — R200 Anesthesia of skin: Secondary | ICD-10-CM

## 2018-02-19 DIAGNOSIS — G4733 Obstructive sleep apnea (adult) (pediatric): Secondary | ICD-10-CM | POA: Diagnosis not present

## 2018-02-19 MED ORDER — PHENTERMINE HCL 37.5 MG PO CAPS: 37.5000 mg | ORAL_CAPSULE | ORAL | 5 refills | Status: DC

## 2018-02-19 NOTE — Telephone Encounter (Signed)
Placed [facility] lab in [facility] lock box for routine lab pick up.  

## 2018-02-19 NOTE — Progress Notes (Signed)
[facility]  PATIENT: Amanda SkeensMarinda Wise DOB: 10/29/1985  REFERRING DOCTOR OR PCP: Ritta SlotMcneill Kirkpatrick SOURCE: Patient, notes from emergency room and hospital admission, laboratory reports, imaging reports, MRI images personally reviewed.  _________________________________   HISTORICAL  CHIEF COMPLAINT:  Chief Complaint  Patient presents with  . New Patient (Initial Visit)    RM 13, alone. Internal referral for new dx of MS. Was at [facility]/29/2018 - 02/03/2018. Had MRI brain/cervical while there.  . Multiple Sclerosis    No family hx of MS. She still has tingling in her hands. Has some shoulder weakness. Gets a "streak of light in left eye". This is intermittent, every other day. This has been going on since 2011. Prior to being admitted in the hospital she was having difficulty grasping items with her hand.    HISTORY OF PRESENT ILLNESS:  I had the pleasure seeing a patient, Amanda Wise[facility] for neurologic consultation regarding her recent diagnosis of multiple sclerosis.     She is a 33 yo woman who had an episode of bilateral leg weakness in 2012, she improved over 4-5 months.  She felt symptoms were due to working out.  In 2016, she had the onset of poor balance x 3-6 months that completely resolved.    She never sought out medical/neurologic care.  In August 2019, she had some weakness and numbness in her legs for a couple weeks but then improved after she saw a Landchiropractor.    In December 2019,  she had the onset of tingling in both hands and right hand weakness.   She went to the ED.  She had MRI of the brain and MRI of the cervical spine and they were consistent with MS.  She was admitted and received 5 days of IV Solu-Medrol.   She felt better by her discharge, closer to her baseline.      Currently, she notes bilateral mild hand numbness.  Legs have no numbness.   She notes mild shoulder weakness but no leg weakness.   She notes  very mild clumsiness in her legs bilaterally but no clumsiness in her hands/arms.    She has urinary frequency and urgency x 1 year. This started while pregnant so she did not think much about it.    She sometimes noted mild blurry vision on the left but has no difference in color vision between the eyes.     She is fatigued and sometimes apathetic.      She feels cognition is mildly cloudy the last couple years and she sometimes has trouble coming up with the right words.     She denies any difficulty with mood but gets frustrated easily.      She notes that her sleep is poor and she sometimes wakes up gasping for air.  She snores heavily and people have told her that she has snorts and gasps in her sleep consistent with OSA.  She has never had a sleep study.  I personally reviewed the MRI of the brain and cervical spine dated 01/30/2018.  The brain shows multiple T2/flair hyperintense foci in the hemispheres in the periventricular, juxtacortical and deep white matter.  There are 2 foci peripherally in the pons and another focus in the left middle cerebellar peduncle.  The one pontine focus enhances.  2 small foci in the hemispheres enhance.  The MRI of the cervical spine shows several foci in the spinal cord including 1 at C3 to the left  and 1 at C6-C7 on the right that enhance.    The other foci located at C2-C3 posteriorly and C3 posteriorly to the right.  She does not have DM but had hyperglycemia with the steroids.      REVIEW OF SYSTEMS: Constitutional: No fevers, chills, sweats, or change in appetite Eyes: No visual changes, double vision, eye pain Ear, nose and throat: No hearing loss, ear pain, nasal congestion, sore throat Cardiovascular: No chest pain, palpitations Respiratory: No shortness of breath at rest or with exertion.   No wheezes GastrointestinaI: No nausea, vomiting, diarrhea, abdominal pain, fecal incontinence Genitourinary: No dysuria, urinary retention or frequency.  No  nocturia. Musculoskeletal: No neck pain, back pain Integumentary: No rash, pruritus, skin lesions Neurological: as above Psychiatric: No depression at this time.  No anxiety Endocrine: No palpitations, diaphoresis, change in appetite, change in weigh or increased thirst Hematologic/Lymphatic: No anemia, purpura, petechiae. Allergic/Immunologic: No itchy/runny eyes, nasal congestion, recent allergic reactions, rashes  ALLERGIES: No Known Allergies  HOME MEDICATIONS:  Current Outpatient Medications:  .  ibuprofen (ADVIL,MOTRIN) 600 MG tablet, Take 1 tablet (600 mg total) by mouth every 8 (eight) hours as needed. Take with food. (Patient not taking: Reported on 01/29/2018), Disp: 20 tablet, Rfl: 0 .  MAGNESIUM PO, Take 3 tablets by mouth daily after breakfast., Disp: , Rfl:   PAST MEDICAL HISTORY: Past Medical History:  Diagnosis Date  . Anemia   . Heart palpitations   . Hypertension   . Pregnancy induced hypertension     PAST SURGICAL HISTORY: Past Surgical History:  Procedure Laterality Date  . NO PAST SURGERIES      FAMILY HISTORY: Family History  Problem Relation Age of Onset  . Hypertension Other   . Hypertension Maternal Grandmother   . Hypertension Paternal Grandmother   . Diabetes Father   . High blood pressure Father     SOCIAL HISTORY:  Social History   Socioeconomic History  . Marital status: Single    Spouse name: Not on file  . Number of children: 2  . Years of education: 25  . Highest education level: Not on file  Occupational History  . Occupation: Psychologist, counselling Needs  . Financial resource strain: Not on file  . Food insecurity:    Worry: Not on file    Inability: Not on file  . Transportation needs:    Medical: Not on file    Non-medical: Not on file  Tobacco Use  . Smoking status: Never Smoker  . Smokeless tobacco: Never Used  Substance and Sexual Activity  . Alcohol use: No  . Drug use: No  . Sexual activity: Yes      Birth control/protection: None  Lifestyle  . Physical activity:    Days per week: Not on file    Minutes per session: Not on file  . Stress: Not on file  Relationships  . Social connections:    Talks on phone: Not on file    Gets together: Not on file    Attends religious service: Not on file    Active member of club or organization: Not on file    Attends meetings of clubs or organizations: Not on file    Relationship status: Not on file  . Intimate partner violence:    Fear of current or ex partner: Not on file    Emotionally abused: Not on file    Physically abused: Not on file    Forced sexual activity: Not on file  Other Topics Concern  . Not on file  Social History Narrative   Lives with family   Caffeine use: soda sometimes   Right handed      PHYSICAL EXAM  Vitals:   02/19/18 1344  BP: 125/70  Pulse: 86  SpO2: 98%  Weight: (!) 327 lb (148.3 kg)  Height: 5\' 7"  (1.702 m)    Body mass index is 51.22 kg/m.   General: The patient is well-developed and well-nourished and in no acute distress.  Pharynx is Mallampati 4  Eyes:  Funduscopic exam shows normal optic discs and retinal vessels.  Neck: The neck is supple, no carotid bruits are noted.  The neck is nontender.  Cardiovascular: The heart has a regular rate and rhythm with a normal S1 and S2. There were no murmurs, gallops or rubs. Lungs are clear to auscultation.  Skin: Extremities are without significant edema.  Musculoskeletal:  Back is nontender  Neurologic Exam  Mental status: The patient is alert and oriented x 3 at the time of the examination. The patient has apparent normal recent and remote memory, with an apparently normal attention span and concentration ability.   Speech is normal.  Cranial nerves: Extraocular movements are full. Pupils are equal, round, and reactive to light and accomodation.  Visual fields are full.  Facial symmetry is present. There is good facial sensation to soft  touch bilaterally.Facial strength is normal.  Trapezius and sternocleidomastoid strength is normal. No dysarthria is noted.  The tongue is midline, and the patient has symmetric elevation of the soft palate. No obvious hearing deficits are noted.  Motor:  Muscle bulk is normal.   Tone is normal. Strength is  5 / 5 in all 4 extremities.   Sensory: Sensory testing is intact to pinprick, soft touch and vibration sensation in the arms but she has reduced touch / temperature in the left leg.  Vibration is symmetric.  Normal trunk sensation to temperature.    Coordination: Cerebellar testing reveals good finger-nose-finger and heel-to-shin bilaterally.  Gait and station: Station is normal.   Gait is fairly normal. Tandem gait is mildly wide. Romberg is negative.   Reflexes: Deep tendon reflexes are symmetric and normal in arms and increased in legs without clonus.     Plantar responses are flexor.    DIAGNOSTIC DATA (LABS, IMAGING, TESTING) - I reviewed patient records, labs, notes, testing and imaging myself where available.  Lab Results  Component Value Date   WBC 13.9 (H) 02/03/2018   HGB 12.3 02/03/2018   HCT 36.8 02/03/2018   MCV 87.4 02/03/2018   PLT 300 02/03/2018      Component Value Date/Time   NA 140 02/02/2018 0625   K 3.7 02/02/2018 0625   CL 102 02/02/2018 0625   CO2 29 02/02/2018 0625   GLUCOSE 114 (H) 02/02/2018 0625   BUN 19 02/02/2018 0625   CREATININE 1.07 (H) 02/02/2018 0625   CALCIUM 8.6 (L) 02/02/2018 0625   PROT 6.7 02/02/2018 0625   ALBUMIN 3.2 (L) 02/02/2018 0625   AST 12 (L) 02/02/2018 0625   ALT 15 02/02/2018 0625   ALKPHOS 51 02/02/2018 0625   BILITOT 0.3 02/02/2018 0625   GFRNONAA >60 02/02/2018 0625   GFRAA >60 02/02/2018 0625    Lab Results  Component Value Date   TSH 0.562 02/13/2014       ASSESSMENT AND PLAN  Multiple sclerosis (HCC) - Plan: Stratify JCV Antibody Test ([facility]), Hepatitis B core antibody, total, Hepatitis B surface  antigen,  IgG, IgA, IgM, QuantiFERON-TB Gold Plus, Hepatitis B surface antibody,qualitative, Hepatitis C antibody, VITAMIN D 25 Hydroxy (Vit-D Deficiency, Fractures)  High risk medication use - Plan: Stratify JCV Antibody Test ([facility]), Hepatitis B core antibody, total, Hepatitis B surface antigen, IgG, IgA, IgM, QuantiFERON-TB Gold Plus, Hepatitis B surface antibody,qualitative, Hepatitis C antibody, VITAMIN D 25 Hydroxy (Vit-D Deficiency, Fractures)  OSA (obstructive sleep apnea) - Plan: Split night study  Vitamin D deficiency - Plan: VITAMIN D 25 Hydroxy (Vit-D Deficiency, Fractures)  Numbness  Urinary urgency   In summary, Ms. Schweder is a 33 year old woman who was recently diagnosed with MS but has had a couple episodes of neurologic dysfunction lasting a couple months at least twice in the past.  The combination of chronic and acute lesions on the MRI plus her history is consistent with clinically definite multiples sclerosis.  I had a conversation with her about my concerns about the aggressiveness of her MS.  Specifically she is presenting with 4 or more spinal plaques, 2 of which appear to be acute and several lesions in the brainstem, 1 of which was acute plus 2 other enhancing lesions in the hemispheres on top of multiple other chronic lesions.  Surprisingly, she has relatively mild deficits on exam.  With his level of aggressiveness, she is at higher risk of progression to disability and I recommend that we initiate a highly effective disease modifying therapy as her first DMT.  We discussed Ocrevus and Tysabri in more detail.  We will check JCV, hepatitis labs, QuantiFERON-TB be to help make the decision and we went over the risks and benefits of both medications.  Additionally, I will check a vitamin D as deficiency can lead to a higher likelihood of progression.    To help with her fatigue and "cognitive fog", I will have her start phentermine 37.5 mg.  Additionally, sleep apnea may be  contributing to her fatigue and we will be checking a sleep study and initiate CPAP or other therapy based on the results.  We will call her next week with the results of the lab work and she will read up on these 2 drugs in the meantime.  We will send in a service request form based on the lab work and hope to get her infused within a couple weeks.  She will follow-up with me for regular visit in 2 to 3 months.  She should call sooner if she has new or worsening neurologic symptoms.  Thank you for asking me to see Ms. Birch.  Please let me know if I can be of further assistance with her or other patients in the future.   Chrystopher Stangl A. Epimenio Foot, MD, [facility] 02/19/2018, 1:47 PM Certified in Neurology, Clinical Neurophysiology, Sleep Medicine, Pain Medicine and Neuroimaging  [facility] [address], [address] [place], [place] [address] [phone]

## 2018-02-20 ENCOUNTER — Ambulatory Visit (INDEPENDENT_AMBULATORY_CARE_PROVIDER_SITE_OTHER): Payer: BLUE CROSS/BLUE SHIELD | Admitting: Internal Medicine

## 2018-02-20 ENCOUNTER — Encounter: Payer: Self-pay | Admitting: Internal Medicine

## 2018-02-20 ENCOUNTER — Telehealth: Payer: Self-pay | Admitting: *Deleted

## 2018-02-20 VITALS — BP 122/62 | HR 92 | Temp 98.1°F | Ht 67.0 in | Wt 323.8 lb

## 2018-02-20 DIAGNOSIS — G35 Multiple sclerosis: Secondary | ICD-10-CM

## 2018-02-20 MED ORDER — VITAMIN D (ERGOCALCIFEROL) 1.25 MG (50000 UNIT) PO CAPS: 50000.0000 [IU] | ORAL_CAPSULE | ORAL | 4 refills | Status: DC

## 2018-02-20 NOTE — Telephone Encounter (Signed)
Called and spoke with patient about lab results per Dr. Epimenio Foot note. She will pick up Vit D prescription today from her pharmacy. She is aware phentermine rx sent in yesterday and also at the pharmacy. She will call back if she has further questions/concerns.

## 2018-02-20 NOTE — Patient Instructions (Signed)
-  It was nice meeting you today!  -Please reschedule your new patient visit in 3 months.

## 2018-02-20 NOTE — Telephone Encounter (Signed)
-----   Message from Asa Lente, MD sent at 02/20/2018 12:11 PM EST ----- We are still waiting for some labs but Vit D was very low    50000 U weekly x 1 year  #13  #4 refills

## 2018-02-20 NOTE — Progress Notes (Signed)
New Patient Office Visit     CC/Reason for Visit: Establish care, needs forms filled out Previous PCP: Unknown Last Visit: Unknown  HPI: Amanda Wise is a 33 y.o. female who is coming in today for the above mentioned reasons. Past Medical History is significant for: Obesity and sleep apnea.  She was hospitalized in December 2019 with right hand numbness, thought she had had a stroke went to the emergency department.  She had MRI of the brain and of the cervical spine that were consistent with multiple sclerosis.  She received 5 days of high-dose IV Solu-Medrol and was discharged home.  On further questioning she has had several episodes throughout her life starting in 2012, of episodic mild weakness that would resolve spontaneously for which she never sought out care.  Since her discharge she has followed up with Dr. Epimenio Foot with [facility] neurology.  He is considering starting her on disease modifying therapy, but has ordered some lab work beforehand.  His note details fatigue and "cognitive fog" for which he has started her on phentermine and is sending her for a sleep study given concerns for sleep apnea.  She is here today to establish care as a new patient but also has extensive forms that she needs filled out.   Past Medical/Surgical History: Past Medical History:  Diagnosis Date  . Anemia   . Heart palpitations   . Hypertension   . Pregnancy induced hypertension     Past Surgical History:  Procedure Laterality Date  . NO PAST SURGERIES      Social History:  reports that she has never smoked. She has never used smokeless tobacco. She reports that she does not drink alcohol or use drugs.  Allergies: No Known Allergies  Family History:  Family History  Problem Relation Age of Onset  . Hypertension Other   . Hypertension Maternal Grandmother   . Hypertension Paternal Grandmother   . Diabetes Father   . High blood pressure Father      Current Outpatient Medications:    .  ibuprofen (ADVIL,MOTRIN) 600 MG tablet, Take 1 tablet (600 mg total) by mouth every 8 (eight) hours as needed. Take with food., Disp: 20 tablet, Rfl: 0 .  MAGNESIUM PO, Take 3 tablets by mouth daily after breakfast., Disp: , Rfl:  .  phentermine 37.5 MG capsule, Take 1 capsule (37.5 mg total) by mouth every morning., Disp: 30 capsule, Rfl: 5  Review of Systems:  Mild right hand numbness, fatigue and daytime sleepiness, otherwise ROS is negative.  Physical Exam: Vitals:   02/20/18 1344  BP: 122/62  Pulse: 92  Temp: 98.1 F (36.7 C)  TempSrc: Oral  SpO2: 92%  Weight: (!) 323 lb 12.8 oz (146.9 kg)  Height: 5\' 7"  (1.702 m)   Body mass index is 50.71 kg/m.   Constitutional: NAD, calm, comfortable Eyes: PERRL, lids and conjunctivae normal ENMT: Mucous membranes are moist.  Respiratory: clear to auscultation bilaterally, no wheezing, no crackles. Normal respiratory effort. No accessory muscle use.  Cardiovascular: Regular rate and rhythm, no murmurs / rubs / gallops. No extremity edema. 2+ pedal pulses. No carotid bruits.  Musculoskeletal: no clubbing / cyanosis. No joint deformity upper and lower extremities. Good ROM, no contractures. Normal muscle tone.  Skin: no rashes, lesions, ulcers. No induration Psychiatric: Normal judgment and insight. Alert and oriented x 3. Normal mood.    Impression and Plan:  Multiple sclerosis (HCC) -Currently being followed by neurology, defer to them.  Morbid obesity (HCC) -  Discussed lifestyle modifications including better food choices and increased physical activity as pertains to weight loss.  Possible obstructive sleep apnea -This is in response to her issue with daytime fatigue and sleepiness for which she is high risk given her morbid obesity. -Neurology has scheduled for sleep study.     Patient Instructions  -It was nice meeting you today!  -Please reschedule your new patient visit in 3 months.     Chaya Jan, MD [facility] at [facility]

## 2018-02-22 LAB — QUANTIFERON-TB GOLD PLUS
QuantiFERON Mitogen Value: >10 IU/mL
QuantiFERON Nil Value: 0.02 IU/mL
QuantiFERON TB1 Ag Value: 0.03 IU/mL
QuantiFERON TB2 Ag Value: 0.03 IU/mL
QuantiFERON-TB Gold Plus: NEGATIVE

## 2018-02-22 LAB — IGG, IGA, IGM
IGA/IMMUNOGLOBULIN A, SERUM: 114 mg/dL (ref 87–352)
IgG (Immunoglobin G), Serum: 1179 mg/dL (ref 700–1600)
IgM (Immunoglobulin M), Srm: 137 mg/dL (ref 26–217)

## 2018-02-22 LAB — HEPATITIS B CORE ANTIBODY, TOTAL: Hep B Core Total Ab: NEGATIVE

## 2018-02-22 LAB — HEPATITIS B SURFACE ANTIBODY,QUALITATIVE: Hep B Surface Ab, Qual: NONREACTIVE

## 2018-02-22 LAB — VITAMIN D 25 HYDROXY (VIT D DEFICIENCY, FRACTURES): Vit D, 25-Hydroxy: 4.9 ng/mL — ABNORMAL LOW (ref 30.0–100.0)

## 2018-02-22 LAB — HEPATITIS C ANTIBODY: Hep C Virus Ab: <0.1 s/co ratio (ref 0.0–0.9)

## 2018-02-22 LAB — HEPATITIS B SURFACE ANTIGEN: Hepatitis B Surface Ag: NEGATIVE

## 2018-02-25 NOTE — Telephone Encounter (Signed)
Called [facility] to check status of result. GNA [id number].  Spoke with [place]. Test is still pending.

## 2018-03-03 ENCOUNTER — Telehealth: Payer: Self-pay | Admitting: Neurology

## 2018-03-03 NOTE — Telephone Encounter (Signed)
JCV ab drawn on 02/19/18 indeterminate, titer: 0.21. Inhibition assay: negative. Given to MD to review.

## 2018-03-03 NOTE — Telephone Encounter (Signed)
I reached voicemail and left a message.  Lab work for the disease modifying therapies was normal.  She can go on either Tysabri or Ocrevus.  We will try to reach her tomorrow or later this week

## 2018-03-04 NOTE — Telephone Encounter (Signed)
Called, LVM for pt to call office to determine what MS therapy she would like to go on. Her labs were normal per Dr. Epimenio Foot.

## 2018-03-06 ENCOUNTER — Telehealth: Payer: Self-pay | Admitting: Neurology

## 2018-03-06 DIAGNOSIS — G35 Multiple sclerosis: Secondary | ICD-10-CM | POA: Diagnosis not present

## 2018-03-06 NOTE — Telephone Encounter (Signed)
Spoke with Dr. Epimenio Foot- he would like to order IV solumedrol 1Gx2 days for today and tomorrow.

## 2018-03-06 NOTE — Telephone Encounter (Signed)
See phone note from 03/06/2018. I spoke with pt about this.

## 2018-03-06 NOTE — Telephone Encounter (Signed)
Pt states since she woke up this morning she has been experiencing dizziness and the room is spinning.  Pt was asked if she needs to go to ED, she stated she is trying to avoid that but if she needs to she will.  Please call

## 2018-03-06 NOTE — Telephone Encounter (Addendum)
I called pt back. Dizziness/spinning started this morning around 8:15am. Sx are about the same now. If she sits still, she feels okay. If she gets up, sx worsen. Denies starting any new medications. Missed a couple days of phentermine. Pt feels a little nauseated. No signs of infection. Advised I will discuss with Dr. Epimenio Foot and call back.  I also relayed that lab work was normal and she can go on either Tysabri or Ocrevus. She would like to go on Tysabri. Advised we will give start form to [facility] to process. CAn take 2-3 weeks for insurance approval and to get her scheduled. She verbalized understanding. I gave completed/signed Tysabri appt to [facility] today.

## 2018-03-06 NOTE — Telephone Encounter (Signed)
I called pt back. Relayed message below. She will come today for IV steroids. She is going to head this way now. She will also come tomorrow at 10:30am.  Gave completed/signed orders to [facility] Jaclynn Guarneri). Michele Rockers will be here at 1pm and tomorrow for them.

## 2018-03-07 DIAGNOSIS — G35 Multiple sclerosis: Secondary | ICD-10-CM | POA: Diagnosis not present

## 2018-03-31 ENCOUNTER — Telehealth: Payer: Self-pay | Admitting: *Deleted

## 2018-03-31 NOTE — Telephone Encounter (Signed)
Called, LVM for pt advising that [facility] has been trying to reach her to offer financial assistance.  Asked her to call them back at [phone].

## 2018-04-08 NOTE — Telephone Encounter (Signed)
Called, LVM for pt to call me back. Londra ([facility]) spoke with me today and advised she got in touch with pt this am but pt was unsure if she wanted to move forward with infusions. She did not specifically state why. I wanted to get a little more information from pt.

## 2018-04-09 ENCOUNTER — Telehealth: Payer: Self-pay | Admitting: *Deleted

## 2018-04-09 NOTE — Telephone Encounter (Signed)
Patients states she want to see if there are any other alternates to help control instead of doing the infusions.

## 2018-04-09 NOTE — Telephone Encounter (Signed)
Tried calling patient back twice and each time call was ended on her end.

## 2018-04-09 NOTE — Telephone Encounter (Signed)
Received notification from [employer] that Tysabri approved 03/19/18-03/18/19. Ref#: [id number]. Gave to [facility] for their records.

## 2018-04-10 NOTE — Telephone Encounter (Signed)
Called, LVM for pt to call office back to discuss concerns she has about getting Tysabri infusions. Gave [facility] phone number.

## 2018-04-14 NOTE — Telephone Encounter (Signed)
I called pt again. She was concerned about PML risk. I addressed her concerns. She is ready to now schedule Tysabri infusions. I placed her on hold and spoke with Gina ([facility]). She advised pt needs to still call FA department at [phone] opt. 1 first. I relayed this to the pt. She verbalized understanding and appreciation.

## 2018-04-16 ENCOUNTER — Telehealth: Payer: Self-pay | Admitting: Neurology

## 2018-04-16 NOTE — Telephone Encounter (Signed)
Pt requested to be transferred to infusion

## 2018-04-21 ENCOUNTER — Telehealth: Payer: Self-pay | Admitting: *Deleted

## 2018-04-21 NOTE — Telephone Encounter (Signed)
Received notification from [employer] that pt is enrolled in $0.00 copay program for Tysabri. Gave to [facility] for their records.   

## 2018-04-23 ENCOUNTER — Ambulatory Visit (INDEPENDENT_AMBULATORY_CARE_PROVIDER_SITE_OTHER): Payer: BC Managed Care – PPO | Admitting: Neurology

## 2018-04-23 ENCOUNTER — Other Ambulatory Visit: Payer: Self-pay

## 2018-04-23 DIAGNOSIS — G4733 Obstructive sleep apnea (adult) (pediatric): Secondary | ICD-10-CM

## 2018-04-24 ENCOUNTER — Other Ambulatory Visit: Payer: Self-pay | Admitting: Neurology

## 2018-04-24 DIAGNOSIS — G4733 Obstructive sleep apnea (adult) (pediatric): Secondary | ICD-10-CM

## 2018-04-24 NOTE — Progress Notes (Signed)
Please let the patient know that the PSG showed OSA that was mild overall but was severe during REM sleep      I put in an order for a CPAP titration

## 2018-04-24 NOTE — Progress Notes (Signed)
PATIENT'S NAME:  Amanda Wise, Avino DOB:      [birth year]      MRN:    [id number]     DATE OF RECORDING: 04/23/2018 REFERRING M.D.:  Ritta Slot MD/DO/NP/PA Study Performed:   Baseline Polysomnogram HISTORY:  Trivia Heffelfinger is a 33 y/o woman with poor sleep.  She snores heavily and people have told her that she has snorts and gasps in her sleep consistent with OSA.  She has never had a sleep study.  The patient's weight 327 pounds with a height of 67 (inches), resulting in a BMI of 51.2 kg/m2.  The patient's neck circumference measured --- inches.  CURRENT MEDICATIONS: Motrin, Magnesium   PROCEDURE:  This is a multichannel digital polysomnogram utilizing the Somnostar 11.2 system.  Electrodes and sensors were applied and monitored per AASM Specifications.   EEG, EOG, Chin and Limb EMG, were sampled at 200 Hz.  ECG, Snore and Nasal Pressure, Thermal Airflow, Respiratory Effort, CPAP Flow and Pressure, Oximetry was sampled at 50 Hz. Digital video and audio were recorded.      BASELINE STUDY: Lights Out was at 21:55 and Lights On at 05:01.  Total recording time (TRT) was 426 minutes, with a total sleep time (TST) of 398 minutes.   The patient's sleep latency was 11 minutes.  REM latency was 99.5 minutes.  The sleep efficiency was 93.4 %.     SLEEP ARCHITECTURE: WASO (Wake after sleep onset) was 16 minutes.  There were 12.5 minutes in Stage N1, 294 minutes Stage N2, 15.5 minutes Stage N3 and 76 minutes in Stage REM.  The percentage of Stage N1 was 3.1%, Stage N2 was 73.9%, Stage N3 was 3.9% and Stage R (REM sleep) was 19.1%.   RESPIRATORY ANALYSIS:  There were a total of 70 respiratory events:  25 obstructive apneas, 0 central apneas and 3 mixed apneas with a total of 28 apneas and an apnea index (AI) of 4.2 /hour. There were 42 hypopneas with a hypopnea index of 6.3 /hour. The patient also had 0 respiratory event related arousals (RERAs).      The total APNEA/HYPOPNEA INDEX (AHI) was 10.6  /hour and the total RESPIRATORY DISTURBANCE INDEX was 10.6 /hour.  48 events occurred in REM sleep and 26 events in NREM. The REM AHI was 37.9 /hour, versus a non-REM AHI of 4.1. The patient spent 353 minutes of total sleep time in the supine position and 45 minutes in non-supine.. The supine AHI was /h 11.9 versus a non-supine AHI of 0.0/h.  OXYGEN SATURATION & C02:  The Wake baseline 02 saturation was 97%, with the lowest being 81%. Time spent below 89% saturation equaled 16 minutes.    AROUSALS:  The arousals were noted as: 65 were spontaneous, 0 were associated with PLMs, 11 were associated with respiratory events.   The patient had a total of 0 Periodic Limb Movements.  The Periodic Limb Movement (PLM) index was 0 and the PLM Arousal index was 0/hour.     Audio and video analysis did not show any abnormal or unusual movements, complex behaviors, phonations or vocalizations.  There was no nocturia.   The EKG showed NSR.   IMPRESSION:  1. Mild overall OSA (AHI = 10.6) that was severe during REM sleep (REM AHI = 37.9) 2. Normal sleep efficiency and sleep architecture.a   RECOMMENDATIONS:  1. CPAP titration 2. Follow-up with Dr. Epimenio Foot  I certify that I have reviewed the entire raw data recording prior to the issuance of  this report in accordance with the Standards of Accreditation of the American Academy of Sleep Medicine (AASM)   La Shehan A. Epimenio Foot, MD, PhD, FAAN Certified in Neurology, Clinical Neurophysiology, Sleep Medicine and Neuroimaging Director, [facility]  [facility] [address], [address] [place], [place] [address] [phone]    Demographics and Medical History           Name: Amanda Wise Age: 33 BMI: 51.2 Interp Physician: Despina Arias, MD  DOB: 21-Jun-1985 Ht-IN: 67 CM: 170 Referred By: Ritta Slot  Pt. Tag:  Wt-LB: 327 KG: 148 Tested By: Domingo Cocking, RPSGT  Pt. #: [id number] Sex:  Female Scored By: Domingo Cocking, RPSGT  Bed Tag: [id number] Race: African American    Sleep Summary    Sleep Time Statistics Minutes Hours    Time in Bed 426    7.1    Total Sleep Time 398    6.6    Total Sleep Time NREM 322    5.4    Total Sleep Time REM 76    1.3    Sleep Onset 11    0.2    Wake After Sleep Onset 16    0.3    Wake After Sleep Period 1    0.0    Latency Persistent Sleep 11    0.2    Sleep Efficiency 93.4 Percent    Lights out 21:55     Lights on 05:01    Sleep Disruption Events Count Index    Arousals 90 13.6    Awakenings 0 0    Arousals + Awakenings 90 13.6    REM Awakenings 1 0.2     Sleep Stage Statistics Wake N1 N2 N3 REM    Percent Stage to SPT 3.9  3.0  71.0  3.7  18.4  Percent   Sleep Period Time in Stage 16 12.5 294 15.5 76 Minutes   Latency to Stage  11 12 70.5 99.5 Minutes   Percent Stage to TST  3.1 73.9 3.9 19.1 Percent   EKG Summary          EKG Statistics         Heart Rate, Wake 81.5 BPM  TST Epochs in HR Interval 0 < 29   Heart Rate, Steady Sleep Avg 78 BPM   0 30-59   PAC Events 0 Count   477 60-79   PVC Events 0 Count   319 80-99   Bradycardia 0 Count   0 100-119   Tachycardia 0 Count   0 120-139        0 140-159    NREM REM   0 > 160   Shortest R-R .6 .6       Longest R-R .9 .8        Respiration Summary  Event Statistics Total  With Arousal  With Awakening    Count Index  Count Index  Count Index   Apneas, Total 28 4.2  2 0.3   0 0.0    Hypopneas, Total 42 6.3  9 1.4   0 0.0    Apnea + Hypopnea Index 70 10.6   11 1.7   0 0.0    Apneas, Supine 28 4.8     Apneas, Non Supine 0 0     Hypopneas, Supine 42 7.1     Hypopneas, Non Supine 0 0     % Sleep Apnea 1.7 Percent     % Sleep Hypopnea 3.2  Percent    Oximetry Statistics       SpO2, Mean Wake 97 Percent     SpO2, Minimum 81 Percent     SpO2, Max 98 Percent     SpO2, Mean 95 Percent            Desaturation Index, REM 31.6  Index     Desaturation Index, NREM  3.7  Index     Desaturation Index, Total 9.0  Index             SpO2 Intervals > 89% 80-89% 70-79% 60-69% 50-59% 40-49% 30-39% < 30%  398 Percent Sleep Time 94.7 5.3 0 0 0 0 0 0  Body Position Statistics   Back Side L Side R Side Prone    Total Sleep Time   353 45.0 45 0 0 Minutes   Percent Time to TST   88.7  11.3  11.3  0.0  0.0  Percent   Number of Events   70 0.0 0 0 0 Count   Number of Apneas   28 0 0 0 0 Count   Number of Hypopneas   42 0 0 0 0 Count   Apnea Index   4.8  0.0  0.0  0.0  0.0  Index   Hypopnea Index   7.1  0.0  0.0  0.0  0.0  Index   Apnea + Hypopnea Index   11.9  0.0  0.0  0.0  0.0  Index  Respiration Events    Non REM, Pre Rx Statistics Non Supine  Supine    Central Mixed Obstr  Central Mixed Obstr   Apneas 0 0 0  0 0 9 Count  Apneas, Minimum SpO2 0 0 0  0 0 88 Percent     Hypopneas 0 0 0  0 0 13 Count  Hypopneas, Minimum SpO2 0 0 0  0 0 84 Percent     Apnea + Hypopneas Index 0.0 0.0 0.0  0.0 0.0 4.7 Index    REM, Pre Rx Statistics Non Supine  Supine    Central Mixed Obstr  Central Mixed Obstr   Apneas 0 0 0  0 3 16 Count  Apneas, Minimum SpO2 0 0 0  0 87 81 Percent     Hypopneas 0 0 0  0 0 29 Count  Hypopneas, Minimum SpO2 0 0 0  0 0 81 Percent     Apnea + Hypopnea Index 0.0 0.0 0.0  0.0 2.4 35.5 Index  Leg Movement Summary    PLM Non REM (Incl. Wake) REM Total    No Arousal Arousal Wake No Arousal Arousal Wake No Arousal Arousal Wake Total   Isolated 30 14 0 0 0 0 30 14 0 44    PLMS 0 0 0 0 0 0 0 0 0 0    Total 30 14 0 0 0 0 30 14 0 44   PLM Statistics PLMS Total     Count Index Count Index    PLM 0 0 44 6.6     PLM with Arousal 0 0 14 2.1     PLM, with Wake 0 0 0 0.0     PLM, Arousal + Wake 0 0.0 14 2.1     PLM, No Arousal 0 0.0  30 4.5     PLM, Non REM 0 0.0  44 8.2     PLM, REM 0 0.0  0 0.0     Technician Comments:  Patient arrived for a diagnostic polysomnogram.  Procedure explained and all questions  answered. Standard paste setup, without complications. Patient slept supine and left during study. Mild to loud snoring noted. After 2 hours total sleep time, RDI =9.5. Patient did not meet SPLIT night criteria. PLMS noted. No obvious cardiac arrhythmias observed. No restroom visit.

## 2018-04-25 NOTE — Progress Notes (Signed)
I called and spoke with pt. Relayed results per Dr. Epimenio Foot note. She is aware she will be called to schedule CPAP titration. If she does not hear within next week or so to schedule, she will call me back.

## 2018-04-29 ENCOUNTER — Telehealth: Payer: Self-pay

## 2018-04-29 DIAGNOSIS — G4733 Obstructive sleep apnea (adult) (pediatric): Secondary | ICD-10-CM

## 2018-04-29 NOTE — Telephone Encounter (Signed)
You have ordered a CPAP study for patient. Due to sleep lab being closed due to Covid 19, would you like to order auto CPAP at home? If so, let sleep lab know to cancel in lab titration study and then you can order auto CPAP for patient. Thanks! 

## 2018-04-29 NOTE — Telephone Encounter (Signed)
Called, LVM for pt to call and discuss. Placed order for auto cpap instead.   Will need to schedule f/u in 31-90days for initial cpap f/u and find out if she has a DME preference. If not, we will send to [facility].

## 2018-04-29 NOTE — Addendum Note (Signed)
Addended by: Hillis Range on: 04/29/2018 03:42 PM   Modules accepted: Orders

## 2018-04-29 NOTE — Telephone Encounter (Signed)
Auto CPAP is fine

## 2018-04-29 NOTE — Telephone Encounter (Signed)
Dr. Sater- are you ok with this? °

## 2018-04-30 NOTE — Telephone Encounter (Signed)
Called, LVM again for pt to call office to discuss  Meagan--you can cx in lab cpap titration, we ordered auto cpap, thanks

## 2018-05-01 ENCOUNTER — Telehealth: Payer: Self-pay | Admitting: Neurology

## 2018-05-01 NOTE — Telephone Encounter (Signed)
Duplicate phone note started. I am pulling message through to this phone note: "Pt has called stating that she is returning the call to RN Kara Mead re: her CPAP mask. Please call"

## 2018-05-01 NOTE — Telephone Encounter (Signed)
Tried calling patientx2, went to VM. I left two messages advising I was working from home and my number shows up unavailable. Asked her to call back

## 2018-05-01 NOTE — Telephone Encounter (Signed)
I called pt back. Relayed below information. She is agreeable to use [facility] for DME.  I scheduled f/u for 07/15/18 at 830am with Dr. Epimenio Foot. She will call if she does not hear back in the next week or so to get set up from [facility]. I sent community message to [facility] that order placed.

## 2018-05-01 NOTE — Telephone Encounter (Signed)
Pt has called stating that she is returning the call to RN Kara Mead re: her CPAP mask. Please call

## 2018-05-01 NOTE — Telephone Encounter (Signed)
Pt returned call, please call back when available.

## 2018-05-01 NOTE — Telephone Encounter (Signed)
See other phone note

## 2018-05-07 ENCOUNTER — Telehealth: Payer: Self-pay | Admitting: *Deleted

## 2018-05-07 DIAGNOSIS — G35 Multiple sclerosis: Secondary | ICD-10-CM | POA: Diagnosis not present

## 2018-05-07 NOTE — Telephone Encounter (Signed)
Received fax notification from touch prescribing program that pt authorization valid from 05/07/18-11/05/18. Account: [facility]. Site auth number: [id number].

## 2018-05-21 DIAGNOSIS — G4733 Obstructive sleep apnea (adult) (pediatric): Secondary | ICD-10-CM | POA: Diagnosis not present

## 2018-05-23 ENCOUNTER — Ambulatory Visit: Payer: BLUE CROSS/BLUE SHIELD | Admitting: Internal Medicine

## 2018-06-04 DIAGNOSIS — G35 Multiple sclerosis: Secondary | ICD-10-CM | POA: Diagnosis not present

## 2018-06-20 DIAGNOSIS — G4733 Obstructive sleep apnea (adult) (pediatric): Secondary | ICD-10-CM | POA: Diagnosis not present

## 2018-07-02 ENCOUNTER — Ambulatory Visit: Payer: BLUE CROSS/BLUE SHIELD | Admitting: Internal Medicine

## 2018-07-02 DIAGNOSIS — G35 Multiple sclerosis: Secondary | ICD-10-CM | POA: Diagnosis not present

## 2018-07-10 ENCOUNTER — Telehealth: Payer: Self-pay | Admitting: *Deleted

## 2018-07-10 NOTE — Addendum Note (Signed)
Addended by: Hillis Range on: 07/10/2018 04:39 PM   Modules accepted: Orders

## 2018-07-10 NOTE — Telephone Encounter (Signed)
Called pt. She consented to virtual visit for 07/15/18 app with Dr. Epimenio Foot. I emailed her link at [email or web address]. Confirmed receipt. Updated med list, pharmacy, allergies on file.

## 2018-07-10 NOTE — Telephone Encounter (Signed)
Called, LVM for pt to call about appt on 07/15/18. Was going to see if she was willing to transition to virtual visit. I was able to print CPAP compliance report from [facility].

## 2018-07-15 ENCOUNTER — Encounter: Payer: Self-pay | Admitting: Neurology

## 2018-07-15 ENCOUNTER — Other Ambulatory Visit: Payer: Self-pay

## 2018-07-15 ENCOUNTER — Ambulatory Visit (INDEPENDENT_AMBULATORY_CARE_PROVIDER_SITE_OTHER): Payer: BC Managed Care – PPO | Admitting: Neurology

## 2018-07-15 DIAGNOSIS — G35 Multiple sclerosis: Secondary | ICD-10-CM

## 2018-07-15 DIAGNOSIS — R200 Anesthesia of skin: Secondary | ICD-10-CM | POA: Diagnosis not present

## 2018-07-15 DIAGNOSIS — Z79899 Other long term (current) drug therapy: Secondary | ICD-10-CM | POA: Diagnosis not present

## 2018-07-15 DIAGNOSIS — G4733 Obstructive sleep apnea (adult) (pediatric): Secondary | ICD-10-CM

## 2018-07-15 MED ORDER — PHENTERMINE HCL 37.5 MG PO CAPS: 37.5000 mg | ORAL_CAPSULE | ORAL | 5 refills | Status: DC

## 2018-07-15 NOTE — Progress Notes (Signed)
[facility]  PATIENT: Amanda RutterMarinda Yvonne Wise DOB: 20-Nov-1985  REFERRING DOCTOR OR PCP: Ritta SlotMcneill Kirkpatrick SOURCE: Patient, notes from emergency room and hospital admission, laboratory reports, imaging reports, MRI images personally reviewed.  _________________________________   HISTORICAL  CHIEF COMPLAINT:  Chief Complaint  Patient presents with  . Multiple Sclerosis    On Tysabri    HISTORY OF PRESENT ILLNESS:  Amanda SkeensMarinda Wise is a 33 y.o. woman who was diagnosed with multiple sclerosis in December 2019.  Update 07/15/2018: Virtual Visit via Video Note I connected with Amanda RutterMarinda Yvonne Wise  on 07/15/18 at  8:30 AM EDT by a video enabled telemedicine application and verified that I am speaking with the correct person.  I discussed the limitations of evaluation and management by telemedicine and the availability of in person appointments. The patient expressed understanding and agreed to proceed.  History of Present Illness: She feels stable with no MS exacerbations.   She will be having her 4th Tysabri infusion soon. Sheis tolerating it well.  Her symptoms improved.   She is walking well.  Strength is ok but she notes a lot of muscle spasms (legs, upper arms and back) and some twitching.  She has some tingling in her legs at times.   Bladder function is doing better.  She notes some depression  She is tired but is not excessively sleepy.   She sleeps only 4-5 hours some nights due to schedule and 2 young kids.   She is less sleepy.   She notes some nights she doesn't use it more than 4 hours as she sometimes takes it off without knowing.   Nights she uses it the entire night she feels much better then next day.    She tried phentermine but had trouble dieting and has not lost any weight.      She denies major cognitive issues but feels cognitively slower this year than last year.   Learning new things at wo    Observations/Objective:  She is a well-developed  well-nourished woman in no acute distress.  The head is normocephalic and atraumatic.  Sclera are anicteric.  Visible skin appears normal.  The neck has a good range of motion.  Pharynx and tongue have normal appearance.  She is alert and fully oriented with fluent speech and good attention, knowledge and memory.  Extraocular muscles are intact.  Facial strength is normal.  Palatal elevation and tongue protrusion are midline.  She appears to have normal strength in the arms.  Rapid alternating movements and finger-nose-finger are performed well.   Assessment and Plan: Multiple sclerosis (HCC) - Plan: Stratify JCV Antibody Test ([facility]), CBC with Differential/Platelet  OSA (obstructive sleep apnea)  High risk medication use - Plan: Stratify JCV Antibody Test ([facility]), CBC with Differential/Platelet  Numbness  1.  Continue Tysabri.   Check JCV Ab at next infusion 2.  Continue CPAP.   We discussed strategies to increase use. 3.  Phentermine for weight loss and for fatigue/focus. 4.   Stay active and exercise.  5.   Vit D supp.  Follow CDC guidelines for COvid.   6.  RTC 6 months sooner if new or worsening neurologic issues  Follow Up Instructions: I discussed the assessment and treatment plan with the patient. The patient was provided an opportunity to ask questions and all were answered. The patient agreed with the plan and demonstrated an understanding of the instructions.    The patient was advised to call back or seek an in-person  evaluation if the symptoms worsen or if the condition fails to improve as anticipated.  I provided 27 minutes of non-face-to-face time during this encounter.  __________________________________________________  FROM 02/19/2018: She is a 33 yo woman who had an episode of bilateral leg weakness in 2012, she improved over 4-5 months.  She felt symptoms were due to working out.  In 2016, she had the onset of poor balance x 3-6 months that completely resolved.    She  never sought out medical/neurologic care.  In August 2019, she had some weakness and numbness in her legs for a couple weeks but then improved after she saw a Restaurant manager, fast food.    In December 2019,  she had the onset of tingling in both hands and right hand weakness.   She went to the ED.  She had MRI of the brain and MRI of the cervical spine and they were consistent with MS.  She was admitted and received 5 days of IV Solu-Medrol.   She felt better by her discharge, closer to her baseline.      Currently, she notes bilateral mild hand numbness.  Legs have no numbness.   She notes mild shoulder weakness but no leg weakness.   She notes very mild clumsiness in her legs bilaterally but no clumsiness in her hands/arms.    She has urinary frequency and urgency x 1 year. This started while pregnant so she did not think much about it.    She sometimes noted mild blurry vision on the left but has no difference in color vision between the eyes.     She is fatigued and sometimes apathetic.      She feels cognition is mildly cloudy the last couple years and she sometimes has trouble coming up with the right words.     She denies any difficulty with mood but gets frustrated easily.      She notes that her sleep is poor and she sometimes wakes up gasping for air.  She snores heavily and people have told her that she has snorts and gasps in her sleep consistent with OSA.  She has never had a sleep study.  I personally reviewed the MRI of the brain and cervical spine dated 01/30/2018.  The brain shows multiple T2/flair hyperintense foci in the hemispheres in the periventricular, juxtacortical and deep white matter.  There are 2 foci peripherally in the pons and another focus in the left middle cerebellar peduncle.  The one pontine focus enhances.  2 small foci in the hemispheres enhance.  The MRI of the cervical spine shows several foci in the spinal cord including 1 at C3 to the left and 1 at C6-C7 on the right that  enhance.    The other foci located at C2-C3 posteriorly and C3 posteriorly to the right.  She does not have DM but had hyperglycemia with the steroids.      REVIEW OF SYSTEMS: Constitutional: No fevers, chills, sweats, or change in appetite Eyes: No visual changes, double vision, eye pain Ear, nose and throat: No hearing loss, ear pain, nasal congestion, sore throat Cardiovascular: No chest pain, palpitations Respiratory: No shortness of breath at rest or with exertion.   No wheezes GastrointestinaI: No nausea, vomiting, diarrhea, abdominal pain, fecal incontinence Genitourinary: No dysuria, urinary retention or frequency.  No nocturia. Musculoskeletal: No neck pain, back pain Integumentary: No rash, pruritus, skin lesions Neurological: as above Psychiatric: No depression at this time.  No anxiety Endocrine: No palpitations, diaphoresis, change in  appetite, change in weigh or increased thirst Hematologic/Lymphatic: No anemia, purpura, petechiae. Allergic/Immunologic: No itchy/runny eyes, nasal congestion, recent allergic reactions, rashes  ALLERGIES: No Known Allergies  HOME MEDICATIONS:  Current Outpatient Medications:  .  ibuprofen (ADVIL,MOTRIN) 600 MG tablet, Take 1 tablet (600 mg total) by mouth every 8 (eight) hours as needed. Take with food., Disp: 20 tablet, Rfl: 0 .  natalizumab (TYSABRI) 300 MG/15ML injection, Inject into the vein., Disp: , Rfl:  .  phentermine 37.5 MG capsule, Take 1 capsule (37.5 mg total) by mouth every morning., Disp: 30 capsule, Rfl: 5 .  Probiotic Product (PROBIOTIC PO), Take 1 Dose by mouth daily., Disp: , Rfl:  .  Vitamin D, Ergocalciferol, (DRISDOL) 1.25 MG (50000 UT) CAPS capsule, Take 1 capsule (50,000 Units total) by mouth every 7 (seven) days., Disp: 13 capsule, Rfl: 4  PAST MEDICAL HISTORY: Past Medical History:  Diagnosis Date  . Anemia   . Heart palpitations   . Hypertension   . Pregnancy induced hypertension     PAST SURGICAL  HISTORY: Past Surgical History:  Procedure Laterality Date  . NO PAST SURGERIES      FAMILY HISTORY: Family History  Problem Relation Age of Onset  . Hypertension Other   . Hypertension Maternal Grandmother   . Hypertension Paternal Grandmother   . Diabetes Father   . High blood pressure Father     SOCIAL HISTORY:  Social History   Socioeconomic History  . Marital status: Single    Spouse name: Not on file  . Number of children: 2  . Years of education: 2014  . Highest education level: Not on file  Occupational History  . Occupation: Psychologist, [employer]  Social Needs  . Financial resource strain: Not on file  . Food insecurity:    Worry: Not on file    Inability: Not on file  . Transportation needs:    Medical: Not on file    Non-medical: Not on file  Tobacco Use  . Smoking status: Never Smoker  . Smokeless tobacco: Never Used  Substance and Sexual Activity  . Alcohol use: No  . Drug use: No  . Sexual activity: Yes    Birth control/protection: None  Lifestyle  . Physical activity:    Days per week: Not on file    Minutes per session: Not on file  . Stress: Not on file  Relationships  . Social connections:    Talks on phone: Not on file    Gets together: Not on file    Attends religious service: Not on file    Active member of club or organization: Not on file    Attends meetings of clubs or organizations: Not on file    Relationship status: Not on file  . Intimate partner violence:    Fear of current or ex partner: Not on file    Emotionally abused: Not on file    Physically abused: Not on file    Forced sexual activity: Not on file  Other Topics Concern  . Not on file  Social History Narrative   Lives with family   Caffeine use: soda sometimes   Right handed      PHYSICAL EXAM  There were no vitals filed for this visit.  There is no height or weight on file to calculate BMI.   General: The patient is well-developed and well-nourished and  in no acute distress.  Pharynx is Mallampati 4  Eyes:  Funduscopic exam shows normal optic discs and  retinal vessels.  Neck: The neck is supple, no carotid bruits are noted.  The neck is nontender.  Cardiovascular: The heart has a regular rate and rhythm with a normal S1 and S2. There were no murmurs, gallops or rubs. Lungs are clear to auscultation.  Skin: Extremities are without significant edema.  Musculoskeletal:  Back is nontender  Neurologic Exam  Mental status: The patient is alert and oriented x 3 at the time of the examination. The patient has apparent normal recent and remote memory, with an apparently normal attention span and concentration ability.   Speech is normal.  Cranial nerves: Extraocular movements are full. Pupils are equal, round, and reactive to light and accomodation.  Visual fields are full.  Facial symmetry is present. There is good facial sensation to soft touch bilaterally.Facial strength is normal.  Trapezius and sternocleidomastoid strength is normal. No dysarthria is noted.  The tongue is midline, and the patient has symmetric elevation of the soft palate. No obvious hearing deficits are noted.  Motor:  Muscle bulk is normal.   Tone is normal. Strength is  5 / 5 in all 4 extremities.   Sensory: Sensory testing is intact to pinprick, soft touch and vibration sensation in the arms but she has reduced touch / temperature in the left leg.  Vibration is symmetric.  Normal trunk sensation to temperature.    Coordination: Cerebellar testing reveals good finger-nose-finger and heel-to-shin bilaterally.  Gait and station: Station is normal.   Gait is fairly normal. Tandem gait is mildly wide. Romberg is negative.   Reflexes: Deep tendon reflexes are symmetric and normal in arms and increased in legs without clonus.     Plantar responses are flexor.    DIAGNOSTIC DATA (LABS, IMAGING, TESTING) - I reviewed patient records, labs, notes, testing and imaging myself  where available.  Lab Results  Component Value Date   WBC 13.9 (H) 02/03/2018   HGB 12.3 02/03/2018   HCT 36.8 02/03/2018   MCV 87.4 02/03/2018   PLT 300 02/03/2018      Component Value Date/Time   NA 140 02/02/2018 0625   K 3.7 02/02/2018 0625   CL 102 02/02/2018 0625   CO2 29 02/02/2018 0625   GLUCOSE 114 (H) 02/02/2018 0625   BUN 19 02/02/2018 0625   CREATININE 1.07 (H) 02/02/2018 0625   CALCIUM 8.6 (L) 02/02/2018 0625   PROT 6.7 02/02/2018 0625   ALBUMIN 3.2 (L) 02/02/2018 0625   AST 12 (L) 02/02/2018 0625   ALT 15 02/02/2018 0625   ALKPHOS 51 02/02/2018 0625   BILITOT 0.3 02/02/2018 0625   GFRNONAA >60 02/02/2018 0625   GFRAA >60 02/02/2018 0625    Lab Results  Component Value Date   TSH 0.562 02/13/2014      Nishika Parkhurst A. Epimenio FootSater, MD, [facility] LPhD,FAAN 07/15/2018, 12:51 PM Certified in Neurology, Clinical Neurophysiology, Sleep Medicine, Pain Medicine and Neuroimaging  [facility] [address], [address] [place], [place] [address] [phone]

## 2018-07-17 ENCOUNTER — Telehealth: Payer: Self-pay | Admitting: *Deleted

## 2018-07-17 ENCOUNTER — Ambulatory Visit (INDEPENDENT_AMBULATORY_CARE_PROVIDER_SITE_OTHER): Payer: BC Managed Care – PPO | Admitting: Internal Medicine

## 2018-07-17 ENCOUNTER — Encounter: Payer: Self-pay | Admitting: Internal Medicine

## 2018-07-17 ENCOUNTER — Other Ambulatory Visit: Payer: Self-pay

## 2018-07-17 VITALS — BP 110/80 | HR 99 | Temp 98.8°F | Ht 67.0 in | Wt 339.1 lb

## 2018-07-17 DIAGNOSIS — G35 Multiple sclerosis: Secondary | ICD-10-CM | POA: Diagnosis not present

## 2018-07-17 DIAGNOSIS — Z0000 Encounter for general adult medical examination without abnormal findings: Secondary | ICD-10-CM | POA: Diagnosis not present

## 2018-07-17 DIAGNOSIS — G4733 Obstructive sleep apnea (adult) (pediatric): Secondary | ICD-10-CM | POA: Diagnosis not present

## 2018-07-17 DIAGNOSIS — E559 Vitamin D deficiency, unspecified: Secondary | ICD-10-CM

## 2018-07-17 LAB — LIPID PANEL
Cholesterol: 225 mg/dL — ABNORMAL HIGH (ref 0–200)
HDL: 43.3 mg/dL (ref >39.00)
LDL Cholesterol: 156 mg/dL — ABNORMAL HIGH (ref 0–99)
NonHDL: 181.98
Total CHOL/HDL Ratio: 5
Triglycerides: 130 mg/dL (ref 0.0–149.0)
VLDL: 26 mg/dL (ref 0.0–40.0)

## 2018-07-17 LAB — COMPREHENSIVE METABOLIC PANEL
ALT: 10 U/L (ref 0–35)
AST: 10 U/L (ref 0–37)
Albumin: 3.7 g/dL (ref 3.5–5.2)
Alkaline Phosphatase: 62 U/L (ref 39–117)
BUN: 14 mg/dL (ref 6–23)
CO2: 28 mEq/L (ref 19–32)
Calcium: 8.7 mg/dL (ref 8.4–10.5)
Chloride: 102 mEq/L (ref 96–112)
Creatinine, Ser: 0.99 mg/dL (ref 0.40–1.20)
GFR: 77.49 mL/min (ref >60.00)
Glucose, Bld: 75 mg/dL (ref 70–99)
Potassium: 3.8 mEq/L (ref 3.5–5.1)
Sodium: 137 mEq/L (ref 135–145)
Total Bilirubin: 0.7 mg/dL (ref 0.2–1.2)
Total Protein: 6.5 g/dL (ref 6.0–8.3)

## 2018-07-17 LAB — VITAMIN B12: Vitamin B-12: 270 pg/mL (ref 211–911)

## 2018-07-17 LAB — TSH: TSH: 0.68 u[IU]/mL (ref 0.35–4.50)

## 2018-07-17 LAB — CBC WITH DIFFERENTIAL/PLATELET
Basophils Absolute: 0.1 10*3/uL (ref 0.0–0.1)
Basophils Relative: 0.7 % (ref 0.0–3.0)
Eosinophils Absolute: 0.4 10*3/uL (ref 0.0–0.7)
Eosinophils Relative: 4.4 % (ref 0.0–5.0)
HCT: 34.1 % — ABNORMAL LOW (ref 36.0–46.0)
Hemoglobin: 11.9 g/dL — ABNORMAL LOW (ref 12.0–15.0)
Lymphocytes Relative: 41.4 % (ref 12.0–46.0)
Lymphs Abs: 3.8 10*3/uL (ref 0.7–4.0)
MCHC: 34.8 g/dL (ref 30.0–36.0)
MCV: 85.7 fl (ref 78.0–100.0)
Monocytes Absolute: 0.9 10*3/uL (ref 0.1–1.0)
Monocytes Relative: 9.4 % (ref 3.0–12.0)
Neutro Abs: 4 10*3/uL (ref 1.4–7.7)
Neutrophils Relative %: 44.1 % (ref 43.0–77.0)
Platelets: 227 10*3/uL (ref 150.0–400.0)
RBC: 3.98 Mil/uL (ref 3.87–5.11)
RDW: 13.8 % (ref 11.5–15.5)
WBC: 9.1 10*3/uL (ref 4.0–10.5)

## 2018-07-17 LAB — HEMOGLOBIN A1C: Hgb A1c MFr Bld: 5.5 % (ref 4.6–6.5)

## 2018-07-17 NOTE — Progress Notes (Signed)
New Patient Office Visit     CC/Reason for Visit: Establish care, follow-up chronic conditions Previous PCP: Unknown Last Visit: Unknown  HPI: Amanda Wise is a 33 y.o. female who is coming in today for the above mentioned reasons. Past Medical History is significant for: Multiple sclerosis that was diagnosed in December 2019 followed by neurology, obstructive sleep apnea for which she is not on CPAP, severe vitamin D deficiency on weekly vitamin D that she just started last week.,  Morbid obesity with a BMI of greater than 50.  She had gestational hypertension but no recent elevated blood pressures.  When seen by neurology 2 days ago she was started on phentermine, she is unclear whether they intend to follow her for weight loss (?).  She has no acute complaints today other than being fatigued and wanting to lose weight.  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  Her family history is significant for lots of coronary artery disease in her mother's family including uncles, grandmother, she is unaware of any other family history.  She had a Pap smear in 2018 that was reported as normal with her GYN during her pregnancy, she does not have routine eye or dental care.   Past Medical/Surgical History: Past Medical History:  Diagnosis Date  . Anemia   . Heart palpitations   . Hypertension   . Pregnancy induced hypertension     Past Surgical History:  Procedure Laterality Date  . NO PAST SURGERIES      Social History:  reports that she has never smoked. She has never used smokeless tobacco. She reports that she does not drink alcohol or use drugs.  Allergies: No Known Allergies  Family History:  Family History  Problem Relation Age of Onset  . Hypertension Other   . Hypertension Maternal Grandmother   . Hypertension Paternal Grandmother   . Diabetes Father   . High blood pressure Father       Current Outpatient Medications:  .  ibuprofen (ADVIL,MOTRIN) 600 MG tablet, Take 1 tablet (600 mg total) by mouth every 8 (eight) hours as needed. Take with food., Disp: 20 tablet, Rfl: 0 .  natalizumab (TYSABRI) 300 MG/15ML injection, Inject into the vein., Disp: , Rfl:  .  phentermine 37.5 MG capsule, Take 1 capsule (37.5 mg total) by mouth every morning., Disp: 30 capsule, Rfl: 5 .  Probiotic Product (PROBIOTIC PO), Take 1 Dose by mouth daily., Disp: , Rfl:  .  Vitamin D, Ergocalciferol, (DRISDOL) 1.25 MG (50000 UT) CAPS capsule, Take 1 capsule (50,000 Units total) by mouth every 7 (seven) days., Disp: 13 capsule, Rfl: 4  Review of Systems:  Constitutional: Denies fever, chills, diaphoresis, appetite change and fatigue.  HEENT: Denies photophobia, eye pain, redness, hearing loss, ear pain, congestion, sore throat, rhinorrhea, sneezing, mouth sores, trouble swallowing, neck pain, neck stiffness and tinnitus.   Respiratory: Denies SOB, DOE, cough, chest tightness,  and wheezing.   Cardiovascular: Denies chest pain, palpitations and leg swelling.  Gastrointestinal: Denies nausea, vomiting, abdominal pain, diarrhea, constipation, blood in stool and abdominal distention.  Genitourinary: Denies dysuria, urgency, frequency, hematuria, flank pain and difficulty urinating.  Endocrine: Denies: hot or cold intolerance, sweats, changes in hair or nails, polyuria, polydipsia. Musculoskeletal: Denies myalgias, back pain, joint swelling, arthralgias and gait problem.  Skin: Denies pallor, rash and wound.  Neurological: Denies dizziness, seizures, syncope, weakness, light-headedness,  numbness and headaches.  Hematological: Denies adenopathy. Easy bruising, personal or family bleeding history  Psychiatric/Behavioral: Denies suicidal ideation, mood changes, confusion, nervousness, sleep disturbance and agitation    Physical Exam: Vitals:   07/17/18 0942  BP: 110/80  Pulse: 99  Temp: 98.8 F (37.1 C)   TempSrc: Oral  SpO2: 98%  Weight: (!) 339 lb 1.6 oz (153.8 kg)  Height: '5\' 7"'  (1.702 m)   Body mass index is 53.11 kg/m.  Constitutional: NAD, calm, comfortable, morbidly obese Eyes: PERRL, lids and conjunctivae normal ENMT: Mucous membranes are moist. Posterior pharynx clear of any exudate or lesions. Normal dentition. Tympanic membrane is pearly white, no erythema or bulging. Neck: normal, supple, no masses, no thyromegaly Respiratory: clear to auscultation bilaterally, no wheezing, no crackles. Normal respiratory effort. No accessory muscle use.  Cardiovascular: Regular rate and rhythm, no murmurs / rubs / gallops. No extremity edema. 2+ pedal pulses. No carotid bruits.  Abdomen: no tenderness, no masses palpated. No hepatosplenomegaly. Bowel sounds positive.  Musculoskeletal: no clubbing / cyanosis. No joint deformity upper and lower extremities. Good ROM, no contractures. Normal muscle tone.  Skin: no rashes, lesions, ulcers. No induration Neurologic: CN 2-12 grossly intact. Sensation intact, DTR normal. Strength 5/5 in all 4.  Psychiatric: Normal judgment and insight. Alert and oriented x 3. Normal mood.    Impression and Plan:  Encounter for preventive health examination  -Have recommended routine eye and dental care. -He is up-to-date on age-appropriate immunizations. -Had normal Pap smear in 2018, will be due for repeat in 2021. -Have discussed healthy lifestyle and weight loss. -Screening labs to be performed today.  Vit D deficiency -Extremely low vitamin D level of 4.9 in January 2020. -She has just started replacement which we will continue for 12 weeks, she will need repeat levels at that time.  Morbid obesity ([place])  -Discussed healthy lifestyle, including increased physical activity and better food choices to promote weight loss. -Neurology started her on phentermine for weight loss 2 days ago, I suspect they do not intend to follow her closely for weight loss  issues. -She will be referred to the healthy weight and wellness clinic, patient is advised that I do not prescribe phentermine long-term for weight loss and that she needs to have blood pressure closely monitored while she is on it.   Multiple sclerosis ([place]) -On Tysabri, follow with neurology as scheduled.    Patient Instructions  -Nice meeting you today!!  -Lab work today; will notify you when results are available.  -Make sure you arrange for eye and dental care.  -Referral to weight management has been placed.  -Schedule follow up in 6 months.   Preventive Care 18-39 Years, Female Preventive care refers to lifestyle choices and visits with your health care provider that can promote health and wellness. What does preventive care include?   A yearly physical exam. This is also called an annual well check.  Dental exams once or twice a year.  Routine eye exams. Ask your health care provider how often you should have your eyes checked.  Personal lifestyle choices, including: ? Daily care of your teeth and gums. ? Regular physical activity. ? Eating a healthy diet. ? Avoiding tobacco and drug use. ? Limiting alcohol use. ? Practicing safe sex. ? Taking vitamin and mineral supplements as recommended by your health care provider. What happens during an annual well check? The services and screenings done by your health care provider during your annual well check will depend on  your age, overall health, lifestyle risk factors, and family history of disease. Counseling Your health care provider may ask you questions about your:  Alcohol use.  Tobacco use.  Drug use.  Emotional well-being.  Home and relationship well-being.  Sexual activity.  Eating habits.  Work and work Statistician.  Method of birth control.  Menstrual cycle.  Pregnancy history. Screening You may have the following tests or measurements:  Height, weight, and BMI.  Diabetes screening.  This is done by checking your blood sugar (glucose) after you have not eaten for a while (fasting).  Blood pressure.  Lipid and cholesterol levels. These may be checked every 5 years starting at age 46.  Skin check.  Hepatitis C blood test.  Hepatitis B blood test.  Sexually transmitted disease (STD) testing.  BRCA-related cancer screening. This may be done if you have a family history of breast, ovarian, tubal, or peritoneal cancers.  Pelvic exam and Pap test. This may be done every 3 years starting at age 17. Starting at age 83, this may be done every 5 years if you have a Pap test in combination with an HPV test. Discuss your test results, treatment options, and if necessary, the need for more tests with your health care provider. Vaccines Your health care provider may recommend certain vaccines, such as:  Influenza vaccine. This is recommended every year.  Tetanus, diphtheria, and acellular pertussis (Tdap, Td) vaccine. You may need a Td booster every 10 years.  Varicella vaccine. You may need this if you have not been vaccinated.  HPV vaccine. If you are 8 or younger, you may need three doses over 6 months.  Measles, mumps, and rubella (MMR) vaccine. You may need at least one dose of MMR. You may also need a second dose.  Pneumococcal 13-valent conjugate (PCV13) vaccine. You may need this if you have certain conditions and were not previously vaccinated.  Pneumococcal polysaccharide (PPSV23) vaccine. You may need one or two doses if you smoke cigarettes or if you have certain conditions.  Meningococcal vaccine. One dose is recommended if you are age 77-21 years and a first-year college student living in a residence hall, or if you have one of several medical conditions. You may also need additional booster doses.  Hepatitis A vaccine. You may need this if you have certain conditions or if you travel or work in places where you may be exposed to hepatitis A.  Hepatitis B  vaccine. You may need this if you have certain conditions or if you travel or work in places where you may be exposed to hepatitis B.  Haemophilus influenzae type b (Hib) vaccine. You may need this if you have certain risk factors. Talk to your health care provider about which screenings and vaccines you need and how often you need them. This information is not intended to replace advice given to you by your health care provider. Make sure you discuss any questions you have with your health care provider. Document Released: 03/20/2001 Document Revised: 09/04/2016 Document Reviewed: 11/23/2014 Elsevier Interactive Patient Education  2019 [place], [place] [facility]

## 2018-07-17 NOTE — Telephone Encounter (Signed)
Faxed Tysabri appeal letter to [facility] appeals department at [phone]. Received fax confirmation. Waiting on determination.

## 2018-07-17 NOTE — Patient Instructions (Signed)
-Nice meeting you today!!  -Lab work today; will notify you when results are available.  -Make sure you arrange for eye and dental care.  -Referral to weight management has been placed.  -Schedule follow up in 6 months.   Preventive Care 18-39 Years, Female Preventive care refers to lifestyle choices and visits with your health care provider that can promote health and wellness. What does preventive care include?   A yearly physical exam. This is also called an annual well check.  Dental exams once or twice a year.  Routine eye exams. Ask your health care provider how often you should have your eyes checked.  Personal lifestyle choices, including: ? Daily care of your teeth and gums. ? Regular physical activity. ? Eating a healthy diet. ? Avoiding tobacco and drug use. ? Limiting alcohol use. ? Practicing safe sex. ? Taking vitamin and mineral supplements as recommended by your health care provider. What happens during an annual well check? The services and screenings done by your health care provider during your annual well check will depend on your age, overall health, lifestyle risk factors, and family history of disease. Counseling Your health care provider may ask you questions about your:  Alcohol use.  Tobacco use.  Drug use.  Emotional well-being.  Home and relationship well-being.  Sexual activity.  Eating habits.  Work and work Statistician.  Method of birth control.  Menstrual cycle.  Pregnancy history. Screening You may have the following tests or measurements:  Height, weight, and BMI.  Diabetes screening. This is done by checking your blood sugar (glucose) after you have not eaten for a while (fasting).  Blood pressure.  Lipid and cholesterol levels. These may be checked every 5 years starting at age 18.  Skin check.  Hepatitis C blood test.  Hepatitis B blood test.  Sexually transmitted disease (STD) testing.  BRCA-related  cancer screening. This may be done if you have a family history of breast, ovarian, tubal, or peritoneal cancers.  Pelvic exam and Pap test. This may be done every 3 years starting at age 28. Starting at age 75, this may be done every 5 years if you have a Pap test in combination with an HPV test. Discuss your test results, treatment options, and if necessary, the need for more tests with your health care provider. Vaccines Your health care provider may recommend certain vaccines, such as:  Influenza vaccine. This is recommended every year.  Tetanus, diphtheria, and acellular pertussis (Tdap, Td) vaccine. You may need a Td booster every 10 years.  Varicella vaccine. You may need this if you have not been vaccinated.  HPV vaccine. If you are 36 or younger, you may need three doses over 6 months.  Measles, mumps, and rubella (MMR) vaccine. You may need at least one dose of MMR. You may also need a second dose.  Pneumococcal 13-valent conjugate (PCV13) vaccine. You may need this if you have certain conditions and were not previously vaccinated.  Pneumococcal polysaccharide (PPSV23) vaccine. You may need one or two doses if you smoke cigarettes or if you have certain conditions.  Meningococcal vaccine. One dose is recommended if you are age 76-21 years and a first-year college student living in a residence hall, or if you have one of several medical conditions. You may also need additional booster doses.  Hepatitis A vaccine. You may need this if you have certain conditions or if you travel or work in places where you may be exposed to hepatitis A.  Hepatitis B vaccine. You may need this if you have certain conditions or if you travel or work in places where you may be exposed to hepatitis B.  Haemophilus influenzae type b (Hib) vaccine. You may need this if you have certain risk factors. Talk to your health care provider about which screenings and vaccines you need and how often you need  them. This information is not intended to replace advice given to you by your health care provider. Make sure you discuss any questions you have with your health care provider. Document Released: 03/20/2001 Document Revised: 09/04/2016 Document Reviewed: 11/23/2014 Elsevier Interactive Patient Education  2019 [employer].

## 2018-07-21 DIAGNOSIS — G4733 Obstructive sleep apnea (adult) (pediatric): Secondary | ICD-10-CM | POA: Diagnosis not present

## 2018-08-05 ENCOUNTER — Encounter: Payer: Self-pay | Admitting: Neurology

## 2018-08-05 NOTE — Telephone Encounter (Signed)
First called PA department at [phone]. Spoke with Delia. They cannot check status of appeal and advised me to call main number to pt insurance plan. She did not have number to provide.   I called [phone] and spoke with Mercie Eon. She states appeal received on 07/17/18 and denied stating pt must try oral medication first. Not medically necessary for her to be on Tysabri. States a second level appeal must be completed to try and get it approved. Ref# for call: [id number]. She will fax denial letter to appeal at [phone] [facility].

## 2018-08-06 NOTE — Telephone Encounter (Signed)
Faxed second level appeal to [phone]. Marked Urgent. Received fax confirmation. Waiting on determination.

## 2018-08-19 NOTE — Telephone Encounter (Signed)
Called [phone] and spoke with Reeves Forth to check on status of second level appeal. She advised me to call main number on back of pt insurance card for update. They cannot see appeal status.

## 2018-08-20 DIAGNOSIS — G4733 Obstructive sleep apnea (adult) (pediatric): Secondary | ICD-10-CM | POA: Diagnosis not present

## 2018-08-20 NOTE — Telephone Encounter (Signed)
Late entry from 08/19/18 at 5:00pm: [employer] plan at [phone]. Spoke with Fernande Boyden D who stated they did not receive second level appeal letter faxed on 08/06/18. I advised this was urgent and would like to re-fax with status of urgent review. She gave me fax# [phone]. Received fax confirmation. AttnSaunders Glance ref# [id number]. Waiting on determination.

## 2018-09-01 NOTE — Telephone Encounter (Signed)
Called to check on status of appeal. It was forwarded to appeal department 08/25/18 even though it was faxed on 08/19/18. Marked as urgent and they have 7-14 days to review urgently (45 days for regular appeals). Ref# for appeal: [id number]. Ref# for call: [id number].

## 2018-09-09 NOTE — Telephone Encounter (Signed)
Called again to check on status of appeal. Spoke with [employer]. He states they have 7-14 business days to review urgently. They should have determination by 09/11/2018. They will fax determination to [phone]. Ref# for call: [id number].

## 2018-09-16 NOTE — Telephone Encounter (Signed)
Called again to check on status. Was on hold for 21 minutes, rep picked up but they could not hear me for some reason and ended the call. I called back, was on hold for 22 min, was able to speak with a rep for a minute but could barely hear them and they ended the call on their end with no warning.   I called back again. On hold to speak with rep for 23 minutes. Was able to then speak with Yvone Neu. Provided Ref# for appeal: [id number]. I also provided last call ref#:[id number] from when I spoke with Elta Guadeloupe on 09/09/18. He looked this up. He kept stating "just one moment" and would not give details. When I asked to speak with manager, he repeated again "just one moment". I again asked for manager, he did not respond. I asked if he was still there and he said yes and then call was ended on his end.

## 2018-09-22 ENCOUNTER — Telehealth: Payer: Self-pay | Admitting: Neurology

## 2018-09-22 NOTE — Telephone Encounter (Signed)
LVM to schedule f/u w/ Dr. Felecia Shelling

## 2018-09-25 NOTE — Telephone Encounter (Signed)
Received fax notification from [facility] and appeals dated 09/18/2018 that second level appeal denied. Spoke with Dr. Felecia Shelling. He would like pt to come in for visit to further discuss. I called pt and scheduled visit for 10/02/2018 at 9am. Asked that she wear a mask, allowed one visit but no kids d/t covid-19. She verbalized understanding.

## 2018-09-30 NOTE — Telephone Encounter (Signed)
Pt last Tysabri infusion per Graylon Gunning, RN (intrafusion): 07/02/2018. Dr. Felecia Shelling made aware. He is going to see if she qualifies for drug study. If not, will try to initiate her on Zeposia, Gilenya or Mayzent.

## 2018-10-01 ENCOUNTER — Telehealth: Payer: Self-pay | Admitting: Neurology

## 2018-10-01 NOTE — Telephone Encounter (Signed)
I called to discuss treatment options.   Her insurance will not cover an IV treatment for her MS.   We appealed x 2 and they still will not cover.  She is stable on Tysabri. I will discuss options such as Mayzent, Gilenya or Zeposia.    I think she will qualify for the [employer] study

## 2018-10-02 ENCOUNTER — Encounter: Payer: Self-pay | Admitting: Neurology

## 2018-10-02 ENCOUNTER — Ambulatory Visit (INDEPENDENT_AMBULATORY_CARE_PROVIDER_SITE_OTHER): Payer: BC Managed Care – PPO | Admitting: Neurology

## 2018-10-02 ENCOUNTER — Other Ambulatory Visit: Payer: Self-pay

## 2018-10-02 VITALS — BP 151/92 | HR 90 | Temp 97.8°F | Ht 67.0 in | Wt 340.5 lb

## 2018-10-02 DIAGNOSIS — Z79899 Other long term (current) drug therapy: Secondary | ICD-10-CM | POA: Diagnosis not present

## 2018-10-02 DIAGNOSIS — R41840 Attention and concentration deficit: Secondary | ICD-10-CM | POA: Insufficient documentation

## 2018-10-02 DIAGNOSIS — G35 Multiple sclerosis: Secondary | ICD-10-CM

## 2018-10-02 DIAGNOSIS — R5383 Other fatigue: Secondary | ICD-10-CM

## 2018-10-02 NOTE — Progress Notes (Signed)
[facility]  PATIENT: Amanda Wise DOB: 28-Jul-1985  REFERRING DOCTOR OR PCP: Roland Rack SOURCE: Patient, notes from emergency room and hospital admission, laboratory reports, imaging reports, MRI images personally reviewed.  _________________________________   HISTORICAL  CHIEF COMPLAINT:  Chief Complaint  Patient presents with  . Follow-up    RM 13, alone. Last seen 07/15/2018. Here to discuss DMT options for her MS. Insurance denied second level appeal request to cover her Tysabri. She has to try alternative formulary option first.     HISTORY OF PRESENT ILLNESS:  Amanda Wise is a 33 y.o. woman who was diagnosed with multiple sclerosis in December 2019.  Update 10/02/2018: Her insurance will not continue to approve Tysabri infusions.  Her last infusion was at the end of May.  Therefore, we need to switch to a different disease modifying therapy.  Her insurance will also not cover Ocrevus.  We discussed that the strongest oral agents are the [place], Mayzent and Zeposia.  She appears to qualify for the open label Mayzent study that we are participating in that we will ensure that she can get started without insurance concerns.  We went over the risks and benefits Mayzent including possibility of higher risk of infection, macular edema and heart rate slowing with potential for heart blocks.  Otherwise, she feels stable.  Specifically, her gait has improved compared to earlier this year and is close to baseline.  She no longer has any clumsiness.  She does get some muscle spasticity at times more on the left.  He denies numbness.  She feels vision is the same as last visit.  She notes some fatigue but no excessive daytime sleepiness.  If fatigue is severe enough that it prevents her from doing something she needs to do or wants to do.  She continues to experience difficulties with reduced attention and focus and she makes mistakes at times.   Update 07/15/2018 (Virtual) She feels stable with no MS exacerbations.   She will be having her 4th Tysabri infusion soon. Sheis tolerating it well.  Her symptoms improved.   She is walking well.  Strength is ok but she notes a lot of muscle spasms (legs, upper arms and back) and some twitching.  She has some tingling in her legs at times.   Bladder function is doing better.  She notes some depression  She is tired but is not excessively sleepy.   She sleeps only 4-5 hours some nights due to schedule and 2 young kids.   She is less sleepy.   She notes some nights she doesn't use it more than 4 hours as she sometimes takes it off without knowing.   Nights she uses it the entire night she feels much better then next day.    She tried phentermine but had trouble dieting and has not lost any weight.      She denies major cognitive issues but feels cognitively slower this year than last year.   Learning new things at wo  FROM 02/19/2018: She is a 33 yo woman who had an episode of bilateral leg weakness in 2012, she improved over 4-5 months.  She felt symptoms were due to working out.  In 2016, she had the onset of poor balance x 3-6 months that completely resolved.    She never sought out medical/neurologic care.  In August 2019, she had some weakness and numbness in her legs for a couple weeks but then improved after she saw a Restaurant manager, fast food.  In December 2019,  she had the onset of tingling in both hands and right hand weakness.   She went to the ED.  She had MRI of the brain and MRI of the cervical spine and they were consistent with MS.  She was admitted and received 5 days of IV Solu-Medrol.   She felt better by her discharge, closer to her baseline.      Currently, she notes bilateral mild hand numbness.  Legs have no numbness.   She notes mild shoulder weakness but no leg weakness.   She notes very mild clumsiness in her legs bilaterally but no clumsiness in her hands/arms.    She has urinary frequency and  urgency x 1 year. This started while pregnant so she did not think much about it.    She sometimes noted mild blurry vision on the left but has no difference in color vision between the eyes.     She is fatigued and sometimes apathetic.      She feels cognition is mildly cloudy the last couple years and she sometimes has trouble coming up with the right words.     She denies any difficulty with mood but gets frustrated easily.      She notes that her sleep is poor and she sometimes wakes up gasping for air.  She snores heavily and people have told her that she has snorts and gasps in her sleep consistent with OSA.  She has never had a sleep study.  I personally reviewed the MRI of the brain and cervical spine dated 01/30/2018.  The brain shows multiple T2/flair hyperintense foci in the hemispheres in the periventricular, juxtacortical and deep white matter.  There are 2 foci peripherally in the pons and another focus in the left middle cerebellar peduncle.  The one pontine focus enhances.  2 small foci in the hemispheres enhance.  The MRI of the cervical spine shows several foci in the spinal cord including 1 at C3 to the left and 1 at C6-C7 on the right that enhance.    The other foci located at C2-C3 posteriorly and C3 posteriorly to the right.  She does not have DM but had hyperglycemia with the steroids.      REVIEW OF SYSTEMS: Constitutional: No fevers, chills, sweats, or change in appetite Eyes: No visual changes, double vision, eye pain Ear, nose and throat: No hearing loss, ear pain, nasal congestion, sore throat Cardiovascular: No chest pain, palpitations Respiratory: No shortness of breath at rest or with exertion.   No wheezes GastrointestinaI: No nausea, vomiting, diarrhea, abdominal pain, fecal incontinence Genitourinary: No dysuria, urinary retention or frequency.  No nocturia. Musculoskeletal: No neck pain, back pain Integumentary: No rash, pruritus, skin lesions Neurological:  as above Psychiatric: No depression at this time.  No anxiety Endocrine: No palpitations, diaphoresis, change in appetite, change in weigh or increased thirst Hematologic/Lymphatic: No anemia, purpura, petechiae. Allergic/Immunologic: No itchy/runny eyes, nasal congestion, recent allergic reactions, rashes  ALLERGIES: No Known Allergies  HOME MEDICATIONS:  Current Outpatient Medications:  .  ibuprofen (ADVIL,MOTRIN) 600 MG tablet, Take 1 tablet (600 mg total) by mouth every 8 (eight) hours as needed. Take with food., Disp: 20 tablet, Rfl: 0 .  phentermine 37.5 MG capsule, Take 1 capsule (37.5 mg total) by mouth every morning., Disp: 30 capsule, Rfl: 5 .  Probiotic Product (PROBIOTIC PO), Take 1 Dose by mouth daily., Disp: , Rfl:  .  Vitamin D, Ergocalciferol, (DRISDOL) 1.25 MG (50000 UT) CAPS capsule,  Take 1 capsule (50,000 Units total) by mouth every 7 (seven) days., Disp: 13 capsule, Rfl: 4  PAST MEDICAL HISTORY: Past Medical History:  Diagnosis Date  . Anemia   . Heart palpitations   . Hypertension   . Pregnancy induced hypertension     PAST SURGICAL HISTORY: Past Surgical History:  Procedure Laterality Date  . NO PAST SURGERIES      FAMILY HISTORY: Family History  Problem Relation Age of Onset  . Hypertension Other   . Hypertension Maternal Grandmother   . Hypertension Paternal Grandmother   . Diabetes Father   . High blood pressure Father     SOCIAL HISTORY:  Social History   Socioeconomic History  . Marital status: Single    Spouse name: Not on file  . Number of children: 2  . Years of education: 5614  . Highest education level: Not on file  Occupational History  . Occupation: Psychologist, [employer]selling[employer]  Social Needs  . Financial resource strain: Not on file  . Food insecurity    Worry: Not on file    Inability: Not on file  . Transportation needs    Medical: Not on file    Non-medical: Not on file  Tobacco Use  . Smoking status: Never Smoker  .  Smokeless tobacco: Never Used  Substance and Sexual Activity  . Alcohol use: No  . Drug use: No  . Sexual activity: Yes    Birth control/protection: None  Lifestyle  . Physical activity    Days per week: Not on file    Minutes per session: Not on file  . Stress: Not on file  Relationships  . Social Musicianconnections    Talks on phone: Not on file    Gets together: Not on file    Attends religious service: Not on file    Active member of club or organization: Not on file    Attends meetings of clubs or organizations: Not on file    Relationship status: Not on file  . Intimate partner violence    Fear of current or ex partner: Not on file    Emotionally abused: Not on file    Physically abused: Not on file    Forced sexual activity: Not on file  Other Topics Concern  . Not on file  Social History Narrative   Lives with family   Caffeine use: soda sometimes   Right handed      PHYSICAL EXAM  Vitals:   10/02/18 0901  BP: (!) 151/92  Pulse: 90  Temp: 97.8 F (36.6 C)  Weight: (!) 340 lb 8 oz (154.4 kg)  Height: 5\' 7"  (1.702 m)    Body mass index is 53.33 kg/m.   General: The patient is well-developed and well-nourished and in no acute distress.  Abdomen is nontender with normal sounds.  Eyes:  Funduscopic exam shows normal optic discs and retinal vessels.  Neck: No thyromegaly.  The neck is supple, no carotid bruits are noted.  The neck is nontender.  Cardiovascular: The heart has a regular rate and rhythm with a normal S1 and S2. There were no murmurs, gallops or rubs. Lungs are clear to auscultation.  Skin: Extremities are without rash or edema.  Musculoskeletal: Joints are nontender.  Back is nontender  Neurologic Exam  Mental status: The patient is alert and oriented x 3 at the time of the examination. The patient has apparent normal recent and remote memory, with an apparently normal attention span and concentration ability.  Speech is normal.  Cranial  nerves: Extraocular movements are full. Pupils are equal, round, and reactive to light and accomodation.  Visual acuity is 20/30 OD and 20/20-2 OS.  Facial symmetry is present. There is good facial sensation to soft touch bilaterally.Facial strength is normal.  Trapezius and sternocleidomastoid strength is normal. No dysarthria is noted.  The tongue is midline, and the patient has symmetric elevation of the soft palate. No obvious hearing deficits are noted.  Motor:  Muscle bulk is normal.   Tone is normal. Strength is  5 / 5 in all 4 extremities.   Sensory: Sensory testing is intact to pinprick, soft touch and vibration sensation in the arms and legs now.  Vibration is symmetric.  Normal trunk sensation to temperature.    Coordination: Cerebellar testing reveals good finger-nose-finger and heel-to-shin bilaterally.  Gait and station: Station is normal.   Gait is fairly normal. Tandem gait is mildly wide. Romberg is negative.   Reflexes: Deep tendon reflexes are symmetric and normal in arms and increased in legs without clonus.       DIAGNOSTIC DATA (LABS, IMAGING, TESTING) - I reviewed patient records, labs, notes, testing and imaging myself where available.  Lab Results  Component Value Date   WBC 9.1 07/17/2018   HGB 11.9 (L) 07/17/2018   HCT 34.1 (L) 07/17/2018   MCV 85.7 07/17/2018   PLT 227.0 07/17/2018      Component Value Date/Time   NA 137 07/17/2018 1012   K 3.8 07/17/2018 1012   CL 102 07/17/2018 1012   CO2 28 07/17/2018 1012   GLUCOSE 75 07/17/2018 1012   BUN 14 07/17/2018 1012   CREATININE 0.99 07/17/2018 1012   CALCIUM 8.7 07/17/2018 1012   PROT 6.5 07/17/2018 1012   ALBUMIN 3.7 07/17/2018 1012   AST 10 07/17/2018 1012   ALT 10 07/17/2018 1012   ALKPHOS 62 07/17/2018 1012   BILITOT 0.7 07/17/2018 1012   GFRNONAA >60 02/02/2018 0625   GFRAA >60 02/02/2018 0625    Lab Results  Component Value Date   TSH 0.68 07/17/2018   _________________________________________________________   1. Multiple sclerosis (HCC)   2. High risk medication use   3. Morbid obesity (HCC)   4. Attention deficit   5. Other fatigue     1.   We discussed options for treatment as her insurance will no longer cover Tysabri even though I believe that Tysabri would be the best medicine for her.  We can likely get her into the Mayzent drug study.  She spoke with our Designer, industrial/productresearch coordinator and will come back next week to be screened. 2.    Stay active and exercise as tolerated. 3.    I will see her when she returns for the screening visit but she should call with any new or worsening neurologic symptoms.   Jettie Mannor A. Epimenio FootSater, MD, Edwin CapPhD,FAAN 10/02/2018, 2:15 PM Certified in Neurology, Clinical Neurophysiology, Sleep Medicine, Pain Medicine and Neuroimaging  [facility] [address], [address] [place], [place] [address] [phone]

## 2018-10-09 ENCOUNTER — Other Ambulatory Visit: Payer: Self-pay | Admitting: Neurology

## 2018-10-09 DIAGNOSIS — G35 Multiple sclerosis: Secondary | ICD-10-CM | POA: Diagnosis not present

## 2018-10-09 DIAGNOSIS — Z79899 Other long term (current) drug therapy: Secondary | ICD-10-CM

## 2018-10-09 NOTE — Addendum Note (Signed)
Addended by: Inis Sizer D on: 10/09/2018 10:43 AM   Modules accepted: Orders

## 2018-10-12 LAB — QUANTIFERON-TB GOLD PLUS
QuantiFERON Mitogen Value: >10 IU/mL
QuantiFERON Nil Value: 0.01 IU/mL
QuantiFERON TB1 Ag Value: 0.05 IU/mL
QuantiFERON TB2 Ag Value: 0.02 IU/mL
QuantiFERON-TB Gold Plus: NEGATIVE

## 2018-12-08 ENCOUNTER — Telehealth: Payer: Self-pay | Admitting: Neurology

## 2018-12-08 NOTE — Telephone Encounter (Signed)
  I called and left a message.    We need to get her started on a disease modifying therapy for MS.  She is screened for the exchange study but did not do the ophthalmologic examined in time and will need to be rescreened if she is interested in the study.  Alternatively, we can just place her on either Mayzent or Zeposia

## 2018-12-16 NOTE — Telephone Encounter (Signed)
Pt called in and stated that she is interested in the study , pt is requesting a call back

## 2018-12-18 ENCOUNTER — Telehealth: Payer: Self-pay | Admitting: Neurology

## 2018-12-18 NOTE — Telephone Encounter (Signed)
I spoke to Amanda Wise.  She has not signed in to my chart recently.  She is still interested in the [place] drug study (open label).  We have screened her and she passed the blood work but never got the eye exam.  We will see we can rescreen her.  If she is unable to get into the study, I can still place her on either Mayzent or Zeposia.

## 2018-12-22 ENCOUNTER — Other Ambulatory Visit: Payer: Self-pay

## 2018-12-22 ENCOUNTER — Emergency Department (HOSPITAL_COMMUNITY): Payer: BC Managed Care – PPO

## 2018-12-22 ENCOUNTER — Emergency Department (HOSPITAL_COMMUNITY)
Admission: EM | Admit: 2018-12-22 | Discharge: 2018-12-22 | Disposition: A | Discharge status: Home / Self Care | Payer: BC Managed Care – PPO | Attending: Emergency Medicine | Admitting: Emergency Medicine

## 2018-12-22 DIAGNOSIS — Z20828 Contact with and (suspected) exposure to other viral communicable diseases: Secondary | ICD-10-CM | POA: Diagnosis not present

## 2018-12-22 DIAGNOSIS — E876 Hypokalemia: Secondary | ICD-10-CM | POA: Diagnosis not present

## 2018-12-22 DIAGNOSIS — I10 Essential (primary) hypertension: Secondary | ICD-10-CM | POA: Diagnosis not present

## 2018-12-22 DIAGNOSIS — R0781 Pleurodynia: Secondary | ICD-10-CM

## 2018-12-22 DIAGNOSIS — Z79899 Other long term (current) drug therapy: Secondary | ICD-10-CM | POA: Diagnosis not present

## 2018-12-22 DIAGNOSIS — D649 Anemia, unspecified: Secondary | ICD-10-CM | POA: Diagnosis not present

## 2018-12-22 DIAGNOSIS — R079 Chest pain, unspecified: Secondary | ICD-10-CM | POA: Diagnosis not present

## 2018-12-22 LAB — CBC
HCT: 35 % — ABNORMAL LOW (ref 36.0–46.0)
Hemoglobin: 11.5 g/dL — ABNORMAL LOW (ref 12.0–15.0)
MCH: 29 pg (ref 26.0–34.0)
MCHC: 32.9 g/dL (ref 30.0–36.0)
MCV: 88.4 fL (ref 80.0–100.0)
Platelets: 293 10*3/uL (ref 150–400)
RBC: 3.96 MIL/uL (ref 3.87–5.11)
RDW: 14.5 % (ref 11.5–15.5)
WBC: 9 10*3/uL (ref 4.0–10.5)
nRBC: 0 % (ref 0.0–0.2)

## 2018-12-22 LAB — BASIC METABOLIC PANEL
Anion gap: 11 (ref 5–15)
BUN: 7 mg/dL (ref 6–20)
CO2: 25 mmol/L (ref 22–32)
Calcium: 8.8 mg/dL — ABNORMAL LOW (ref 8.9–10.3)
Chloride: 104 mmol/L (ref 98–111)
Creatinine, Ser: 1.01 mg/dL — ABNORMAL HIGH (ref 0.44–1.00)
GFR calc Af Amer: >60 mL/min (ref >60)
GFR calc non Af Amer: >60 mL/min (ref >60)
Glucose, Bld: 93 mg/dL (ref 70–99)
Potassium: 3.2 mmol/L — ABNORMAL LOW (ref 3.5–5.1)
Sodium: 140 mmol/L (ref 135–145)

## 2018-12-22 LAB — TROPONIN I (HIGH SENSITIVITY)
Troponin I (High Sensitivity): <2 ng/L (ref <18)
Troponin I (High Sensitivity): 3 ng/L (ref <18)

## 2018-12-22 LAB — I-STAT BETA HCG BLOOD, ED (MC, WL, AP ONLY): I-stat hCG, quantitative: <5 m[IU]/mL (ref <5)

## 2018-12-22 LAB — D-DIMER, QUANTITATIVE: D-Dimer, Quant: 0.39 ug/mL-FEU (ref 0.00–0.50)

## 2018-12-22 LAB — MAGNESIUM: Magnesium: 1.9 mg/dL (ref 1.7–2.4)

## 2018-12-22 MED ORDER — SODIUM CHLORIDE 0.9% FLUSH: 3.0000 mL | Freq: Once | INTRAVENOUS | Status: DC

## 2018-12-22 MED ORDER — POTASSIUM CHLORIDE CRYS ER 20 MEQ PO TBCR
40.0000 meq | EXTENDED_RELEASE_TABLET | Freq: Once | ORAL | Status: AC
  Administered 2018-12-22: 40 meq via ORAL
  Filled 2018-12-22: qty 2

## 2018-12-22 MED ORDER — KETOROLAC TROMETHAMINE 30 MG/ML IJ SOLN
30.0000 mg | Freq: Once | INTRAMUSCULAR | Status: AC
  Administered 2018-12-22: 30 mg via INTRAVENOUS
  Filled 2018-12-22: qty 1

## 2018-12-22 NOTE — Discharge Instructions (Addendum)

## 2018-12-22 NOTE — ED Provider Notes (Signed)
[facility] EMERGENCY DEPARTMENT Provider Note   CSN: [id number] Arrival date & time: 12/22/18  2951    History   Chief Complaint Chief Complaint  Patient presents with   Chest Pain    HPI Amanda Wise is a 33 y.o. female.   The history is provided by the patient.  Chest Pain She has history of hypertension, obesity and comes in because of chest pain.  She had chest pain during the afternoon yesterday which was right-sided and sharp, but resolved after about 2 hours.  Pain then woke her up at about 2 AM in the same location.  Pain was rated at 7/10, but has subsided to 2/10.  Pain is worse with taking a deep breath.  She denies actual dyspnea.  There was mild nausea but no vomiting and no diaphoresis.  She denies fever or chills.  There has been a slight cough which is nonproductive.  There has been no diarrhea.  She denies any change in sense of smell or taste.  She denies any recent travel and denies exogenous estrogen use.  She is a non-smoker and denies history of hyperlipidemia or diabetes.  Family history is positive for premature coronary atherosclerosis and some uncles.  Past Medical History:  Diagnosis Date   Anemia    Heart palpitations    Hypertension    Pregnancy induced hypertension     Patient Active Problem List   Diagnosis Date Noted   Attention deficit 10/02/2018   Other fatigue 10/02/2018   Morbid obesity ([place]) 07/17/2018   High risk medication use 02/19/2018   OSA (obstructive sleep apnea) 02/19/2018   Vitamin D deficiency 02/19/2018   Urinary urgency 02/19/2018   Numbness    Multiple sclerosis ([place]) 01/30/2018   Labor and delivery indication for care or intervention 08/07/2016   Spontaneous vaginal delivery 08/07/2016   Positive GBS test 03/06/2015   Vaginal delivery 03/06/2015   Antepartum non-reassuring fetal heart rate or rhythm affecting care of mother 03/04/2015   Status post fall 03/03/2015    Abnormal antenatal test 03/03/2015    Past Surgical History:  Procedure Laterality Date   NO PAST SURGERIES       OB History    Gravida  2   Para  2   Term  2   Preterm      AB      Living  2     SAB      TAB      Ectopic      Multiple  0   Live Births  2            Home Medications    Prior to Admission medications   Medication Sig Start Date End Date Taking? Authorizing Provider  ibuprofen (ADVIL,MOTRIN) 600 MG tablet Take 1 tablet (600 mg total) by mouth every 8 (eight) hours as needed. Take with food. 08/17/17   Lajean Saver, MD  phentermine 37.5 MG capsule Take 1 capsule (37.5 mg total) by mouth every morning. 07/15/18   Sater, Nanine Means, MD  Probiotic Product (PROBIOTIC PO) Take 1 Dose by mouth daily.    [provider]  Vitamin D, Ergocalciferol, (DRISDOL) 1.25 MG (50000 UT) CAPS capsule Take 1 capsule (50,000 Units total) by mouth every 7 (seven) days. 02/20/18   Sater, Nanine Means, MD    Family History Family History  Problem Relation Age of Onset   Hypertension Other    Hypertension Maternal Grandmother    Hypertension Paternal  Grandmother    Diabetes Father    High blood pressure Father     Social History Social History   Tobacco Use   Smoking status: Never Smoker   Smokeless tobacco: Never Used  Substance Use Topics   Alcohol use: No   Drug use: No     Allergies   Patient has no known allergies.   Review of Systems Review of Systems  Cardiovascular: Positive for chest pain.  All other systems reviewed and are negative.    Physical Exam Updated Vital Signs BP 115/72 (BP Location: Right Arm)    Pulse 80    Temp 98.3 F (36.8 C) (Oral)    Resp 16    SpO2 99%   Physical Exam Vitals signs and nursing note reviewed.    Morbidly obese[age] year old female, resting comfortably and in no acute distress. Vital signs are normal. Oxygen saturation is 99%, which is normal. Head is normocephalic and atraumatic.  PERRLA, EOMI. Oropharynx is clear. Neck is nontender and supple without adenopathy or JVD. Back is nontender and there is no CVA tenderness. Lungs are clear without rales, wheezes, or rhonchi. Chest is nontender. Heart has regular rate and rhythm without murmur. Abdomen is soft, flat, nontender without masses or hepatosplenomegaly and peristalsis is normoactive. Extremities have no cyanosis or edema, full range of motion is present. Skin is warm and dry without rash. Neurologic: Mental status is normal, cranial nerves are intact, there are no motor or sensory deficits.  ED Treatments / Results  Labs (all labs ordered are listed, but only abnormal results are displayed) Labs Reviewed  BASIC METABOLIC PANEL - Abnormal; Notable for the following components:      Result Value   Potassium 3.2 (*)    Creatinine, Ser 1.01 (*)    Calcium 8.8 (*)    All other components within normal limits  CBC - Abnormal; Notable for the following components:   Hemoglobin 11.5 (*)    HCT 35.0 (*)    All other components within normal limits  I-STAT BETA HCG BLOOD, ED ([facility], [facility], [facility] ONLY)  TROPONIN I (HIGH SENSITIVITY)  TROPONIN I (HIGH SENSITIVITY)    EKG EKG Interpretation  Date/Time:  Monday December 22 2018 02:36:24 EST Ventricular Rate:  86 PR Interval:  170 QRS Duration: 88 QT Interval:  344 QTC Calculation: 411 R Axis:   31 Text Interpretation: Normal sinus rhythm Nonspecific T wave abnormality Abnormal ECG When compared with ECG of 01/21/2018, Nonspecific T wave abnormality is now present Confirmed by Dione BoozeGlick, Tishawn Friedhoff ([id number]) on 12/22/2018 3:17:08 AM   EKG Interpretation  Date/Time:  Monday December 22 2018 05:47:21 EST Ventricular Rate:  84 PR Interval:  170 QRS Duration: 87 QT Interval:  347 QTC Calculation: 411 R Axis:   28 Text Interpretation: Sinus rhythm Borderline T wave abnormalities When compared with ECG of EARLIER SAME DATE No significant change was found Confirmed by Dione BoozeGlick,  Rini Moffit ([id number]) on 12/22/2018 5:56:14 AM       Radiology Dg Chest 2 View  Result Date: 12/22/2018 CLINICAL DATA:  Right-sided chest pain. EXAM: CHEST - 2 VIEW COMPARISON:  01/21/2018 FINDINGS: Upper normal heart size with unchanged mediastinal contours. The lungs are clear. Pulmonary vasculature is normal. No consolidation, pleural effusion, or pneumothorax. No acute osseous abnormalities are seen. IMPRESSION: Borderline cardiomegaly. No acute pulmonary process. Electronically Signed   By: Narda RutherfordMelanie  Sanford M.D.   On: 12/22/2018 02:52    Procedures Procedures (including critical care time)  Medications Ordered in ED  Medications  sodium chloride flush (NS) 0.9 % injection 3 mL (has no administration in time range)     Initial Impression / Assessment and Plan / ED Course  I have reviewed the triage vital signs and the nursing notes.  Pertinent labs & imaging results that were available during my care of the patient were reviewed by me and considered in my medical decision making (see chart for details).  Pleuritic chest pain of uncertain cause.  Although patient has history of hypertension and morbid obesity, heart score is 3 which puts her at low risk for major adverse cardiac events in the next 6 weeks.  I suspect viral pleurisy, but given her obesity and sedentary lifestyle, will screen for pulmonary embolism with D-dimer.  In the meantime, she is given a dose of ketorolac for pain.  Old records are reviewed, and she does have prior ED visit for atypical chest pain.  Labs do show hypokalemia.  She is given a dose of oral potassium and will check magnesium level.  Troponin is normal x2.  Chest x-ray is unremarkable.  At this point, magnesium and D-dimer are pending.  Case is signed out to Dr. Rhunette Croft.  Final Clinical Impressions(s) / ED Diagnoses   Final diagnoses:  None    ED Discharge Orders    None       Dione Booze, MD 12/22/18 [phone]

## 2018-12-22 NOTE — ED Triage Notes (Signed)
Per pt she had some chest pain that happened today and then tonight happened again. Nausea to day but not tonight. Pt said pain stays in the center of her chest. No cough, no sob

## 2019-02-18 ENCOUNTER — Other Ambulatory Visit: Payer: Self-pay | Admitting: Neurology

## 2019-02-18 DIAGNOSIS — Z79899 Other long term (current) drug therapy: Secondary | ICD-10-CM

## 2019-02-18 DIAGNOSIS — G35 Multiple sclerosis: Secondary | ICD-10-CM

## 2019-02-18 NOTE — Addendum Note (Signed)
Addended by: Tamera Stands D on: 02/18/2019 04:13 PM   Modules accepted: Orders

## 2019-02-19 LAB — QUANTIFERON-TB GOLD PLUS

## 2019-02-26 ENCOUNTER — Telehealth: Payer: Self-pay | Admitting: *Deleted

## 2019-02-26 ENCOUNTER — Other Ambulatory Visit (INDEPENDENT_AMBULATORY_CARE_PROVIDER_SITE_OTHER): Payer: Self-pay

## 2019-02-26 ENCOUNTER — Other Ambulatory Visit: Payer: Self-pay | Admitting: Neurology

## 2019-02-26 DIAGNOSIS — Z0289 Encounter for other administrative examinations: Secondary | ICD-10-CM

## 2019-02-26 DIAGNOSIS — Z79899 Other long term (current) drug therapy: Secondary | ICD-10-CM

## 2019-02-26 DIAGNOSIS — Z111 Encounter for screening for respiratory tuberculosis: Secondary | ICD-10-CM | POA: Diagnosis not present

## 2019-02-26 DIAGNOSIS — G35 Multiple sclerosis: Secondary | ICD-10-CM

## 2019-02-26 DIAGNOSIS — R799 Abnormal finding of blood chemistry, unspecified: Secondary | ICD-10-CM | POA: Diagnosis not present

## 2019-02-26 NOTE — Telephone Encounter (Signed)
I called [facility] at [phone]. Account # [id number]. They state the TB test drawn on 02/18/19 does not meet requirements. Arrived spun down and not incubated. Pt needs to have it redrawn. Dr. Epimenio Foot placed a new order. I called pt. She will come today before 4:30pm to have it redrawn. I made Dr. Epimenio Foot aware and he will let Jamil in research know as well.

## 2019-03-02 LAB — QUANTIFERON-TB GOLD PLUS
QuantiFERON Mitogen Value: >10 IU/mL
QuantiFERON Nil Value: 0.04 IU/mL
QuantiFERON TB1 Ag Value: 0.05 IU/mL
QuantiFERON TB2 Ag Value: 0.04 IU/mL
QuantiFERON-TB Gold Plus: NEGATIVE

## 2019-04-02 ENCOUNTER — Telehealth: Payer: Self-pay | Admitting: *Deleted

## 2019-04-02 DIAGNOSIS — G35 Multiple sclerosis: Secondary | ICD-10-CM

## 2019-04-02 MED ORDER — PREDNISONE 50 MG PO TABS: ORAL_TABLET | ORAL | 0 refills | Status: DC

## 2019-04-02 NOTE — Telephone Encounter (Signed)
Per Jamil in research, pt called reporting she was having issues with left eye. She has hx of problems in right eye. She also has a headache.   I called pt. She is having blurry vision in right eye. Left eye not as clear as it normally is (denies any double/blurry vision). Headache started this am. Has not taken anything. Pain located on right side of head/behind eye.  Confirmed she has been on Mayzent for about 25 days. She is tolerating well. Denies signs/sx of covid-19 or being exposed to anyone with covid-19. She denies any signs/sx of infection (no UTI).  Placed her on hold and spoke with MD.  Per Dr. Epimenio Foot, he wants to call in high dose prednisone 50mg  (10 tablets po qd x3 days). If she does not feel better by Monday she should call Tuesday. I relayed this, she verbalized understanding.   She wanted to know when her research appt was on Wednesday, 04/08/19. I placed her on hold and spoke with Jamil. Relayed per 06/08/19 that it is at 9am.

## 2019-04-14 ENCOUNTER — Telehealth: Payer: Self-pay | Admitting: *Deleted

## 2019-04-14 NOTE — Telephone Encounter (Signed)
Called pt. Scheduled work in appt for 04/16/19 at 2pm with Dr. Epimenio Foot. Asked she check in by 1:30pm and make sure to wear a mask.

## 2019-04-14 NOTE — Telephone Encounter (Signed)
Pt contacted research again via phone: "Good afternoon this is Amanda Wise from the [employer]. I just have a concern. My face muscle on the right side has been twitching non stop for about a week to the point where my lip is being pulled upward slightly. Would I call in to schedule a regular appt or go through research?"

## 2019-04-14 NOTE — Telephone Encounter (Signed)
This would need to be a regular appt..  Can we get her in tomorrow or Thursday?

## 2019-04-16 ENCOUNTER — Ambulatory Visit (INDEPENDENT_AMBULATORY_CARE_PROVIDER_SITE_OTHER): Payer: BC Managed Care – PPO | Admitting: Neurology

## 2019-04-16 ENCOUNTER — Encounter: Payer: Self-pay | Admitting: Neurology

## 2019-04-16 ENCOUNTER — Other Ambulatory Visit: Payer: Self-pay

## 2019-04-16 VITALS — BP 173/97 | HR 92 | Temp 98.0°F | Ht 67.0 in | Wt 350.0 lb

## 2019-04-16 DIAGNOSIS — R200 Anesthesia of skin: Secondary | ICD-10-CM | POA: Diagnosis not present

## 2019-04-16 DIAGNOSIS — G5131 Clonic hemifacial spasm, right: Secondary | ICD-10-CM

## 2019-04-16 DIAGNOSIS — G35 Multiple sclerosis: Secondary | ICD-10-CM

## 2019-04-16 DIAGNOSIS — R5383 Other fatigue: Secondary | ICD-10-CM

## 2019-04-16 DIAGNOSIS — G253 Myoclonus: Secondary | ICD-10-CM

## 2019-04-16 DIAGNOSIS — G35D Multiple sclerosis, unspecified: Secondary | ICD-10-CM

## 2019-04-16 DIAGNOSIS — R3915 Urgency of urination: Secondary | ICD-10-CM

## 2019-04-16 DIAGNOSIS — Z79899 Other long term (current) drug therapy: Secondary | ICD-10-CM | POA: Diagnosis not present

## 2019-04-16 MED ORDER — MODAFINIL 200 MG PO TABS: 200.0000 mg | ORAL_TABLET | Freq: Every day | ORAL | 5 refills | Status: DC

## 2019-04-16 MED ORDER — LEVETIRACETAM 750 MG PO TABS: 750.0000 mg | ORAL_TABLET | Freq: Two times a day (BID) | ORAL | 11 refills | Status: DC

## 2019-04-16 NOTE — Progress Notes (Signed)
[facility]  PATIENT: Amanda Wise DOB: 11-26-1985  REFERRING DOCTOR OR PCP: Roland Rack SOURCE: Patient, notes from emergency room and hospital admission, laboratory reports, imaging reports, MRI images personally reviewed.  _________________________________   HISTORICAL  CHIEF COMPLAINT:  Chief Complaint  Patient presents with  . Follow-up    RM 13, alone. Last seen 10/02/2018  . Multiple Sclerosis    In Mayzent drug study (open label). Having right sided facial twitch, draws up her lip for greater than 1 wk.     HISTORY OF PRESENT ILLNESS:  Amanda Wise is a 34 y.o. woman who was diagnosed with multiple sclerosis in December 2019.  Update 04/16/2019: She started in the siponimod (Mayzent) clinical trial.    She is noting right sided facial twitching over the last week.   The twitching is in the corner of the mouth and eyelid.  It is near continuous in waves.     She notes right visual blurriness.  She feels her gait has done well.  She notes no new numbness or weakness.  Bladder function is doing well.  Fatigue is worse again   She felt some benefit from phentermine but she sometimes felt she crashed in the early to mid afternoon.   Mood and cognition are doing well.  Update 10/02/2018: Her insurance will not continue to approve Tysabri infusions.  Her last infusion was at the end of May.  Therefore, we need to switch to a different disease modifying therapy.  Her insurance will also not cover Ocrevus.  We discussed that the strongest oral agents are the Gilenya, Mayzent and Zeposia.  She appears to qualify for the open label Mayzent study that we are participating in that we will ensure that she can get started without insurance concerns.  We went over the risks and benefits Mayzent including possibility of higher risk of infection, macular edema and heart rate slowing with potential for heart blocks.  Otherwise, she feels stable.   Specifically, her gait has improved compared to earlier this year and is close to baseline.  She no longer has any clumsiness.  She does get some muscle spasticity at times more on the left.  He denies numbness.  She feels vision is the same as last visit.  She notes some fatigue but no excessive daytime sleepiness.  If fatigue is severe enough that it prevents her from doing something she needs to do or wants to do.  She continues to experience difficulties with reduced attention and focus and she makes mistakes at times.  Update 07/15/2018 (Virtual) She feels stable with no MS exacerbations.   She will be having her 4th Tysabri infusion soon. Amanda Wise tolerating it well.  Her symptoms improved.   She is walking well.  Strength is ok but she notes a lot of muscle spasms (legs, upper arms and back) and some twitching.  She has some tingling in her legs at times.   Bladder function is doing better.  She notes some depression  She is tired but is not excessively sleepy.   She sleeps only 4-5 hours some nights due to schedule and 2 young kids.   She is less sleepy.   She notes some nights she doesn't use it more than 4 hours as she sometimes takes it off without knowing.   Nights she uses it the entire night she feels much better then next day.    She tried phentermine but had trouble dieting and has not lost any  weight.      She denies major cognitive issues but feels cognitively slower this year than last year.   Learning new things at wo  FROM 02/19/2018: She is a 34 yo woman who had an episode of bilateral leg weakness in 2012, she improved over 4-5 months.  She felt symptoms were due to working out.  In 2016, she had the onset of poor balance x 3-6 months that completely resolved.    She never sought out medical/neurologic care.  In August 2019, she had some weakness and numbness in her legs for a couple weeks but then improved after she saw a Land.    In December 2019,  she had the onset of tingling in  both hands and right hand weakness.   She went to the ED.  She had MRI of the brain and MRI of the cervical spine and they were consistent with MS.  She was admitted and received 5 days of IV Solu-Medrol.   She felt better by her discharge, closer to her baseline.      Currently, she notes bilateral mild hand numbness.  Legs have no numbness.   She notes mild shoulder weakness but no leg weakness.   She notes very mild clumsiness in her legs bilaterally but no clumsiness in her hands/arms.    She has urinary frequency and urgency x 1 year. This started while pregnant so she did not think much about it.    She sometimes noted mild blurry vision on the left but has no difference in color vision between the eyes.     She is fatigued and sometimes apathetic.      She feels cognition is mildly cloudy the last couple years and she sometimes has trouble coming up with the right words.     She denies any difficulty with mood but gets frustrated easily.      She notes that her sleep is poor and she sometimes wakes up gasping for air.  She snores heavily and people have told her that she has snorts and gasps in her sleep consistent with OSA.  She has never had a sleep study.  I personally reviewed the MRI of the brain and cervical spine dated 01/30/2018.  The brain shows multiple T2/flair hyperintense foci in the hemispheres in the periventricular, juxtacortical and deep white matter.  There are 2 foci peripherally in the pons and another focus in the left middle cerebellar peduncle.  The one pontine focus enhances.  2 small foci in the hemispheres enhance.  The MRI of the cervical spine shows several foci in the spinal cord including 1 at C3 to the left and 1 at C6-C7 on the right that enhance.    The other foci located at C2-C3 posteriorly and C3 posteriorly to the right.  She does not have DM but had hyperglycemia with the steroids.      REVIEW OF SYSTEMS: Constitutional: No fevers, chills, sweats, or change  in appetite Eyes: No visual changes, double vision, eye pain Ear, nose and throat: No hearing loss, ear pain, nasal congestion, sore throat Cardiovascular: No chest pain, palpitations Respiratory: No shortness of breath at rest or with exertion.   No wheezes GastrointestinaI: No nausea, vomiting, diarrhea, abdominal pain, fecal incontinence Genitourinary: No dysuria, urinary retention or frequency.  No nocturia. Musculoskeletal: No neck pain, back pain Integumentary: No rash, pruritus, skin lesions Neurological: as above Psychiatric: No depression at this time.  No anxiety Endocrine: No palpitations, diaphoresis, change in  appetite, change in weigh or increased thirst Hematologic/Lymphatic: No anemia, purpura, petechiae. Allergic/Immunologic: No itchy/runny eyes, nasal congestion, recent allergic reactions, rashes  ALLERGIES: No Known Allergies  HOME MEDICATIONS:  Current Outpatient Medications:  Marland Kitchen  Vitamin D, Ergocalciferol, (DRISDOL) 1.25 MG (50000 UT) CAPS capsule, Take 1 capsule (50,000 Units total) by mouth every 7 (seven) days., Disp: 13 capsule, Rfl: 4 .  levETIRAcetam (KEPPRA) 750 MG tablet, Take 1 tablet (750 mg total) by mouth 2 (two) times daily., Disp: 120 tablet, Rfl: 11 .  modafinil (PROVIGIL) 200 MG tablet, Take 1 tablet (200 mg total) by mouth daily., Disp: 30 tablet, Rfl: 5  PAST MEDICAL HISTORY: Past Medical History:  Diagnosis Date  . Anemia   . Heart palpitations   . Hypertension   . Pregnancy induced hypertension     PAST SURGICAL HISTORY: Past Surgical History:  Procedure Laterality Date  . NO PAST SURGERIES      FAMILY HISTORY: Family History  Problem Relation Age of Onset  . Hypertension Other   . Hypertension Maternal Grandmother   . Hypertension Paternal Grandmother   . Diabetes Father   . High blood pressure Father     SOCIAL HISTORY:  Social History   Socioeconomic History  . Marital status: Single    Spouse name: Not on file  .  Number of children: 2  . Years of education: 39  . Highest education level: Not on file  Occupational History  . Occupation: Orthoptist  Tobacco Use  . Smoking status: Never Smoker  . Smokeless tobacco: Never Used  Substance and Sexual Activity  . Alcohol use: No  . Drug use: No  . Sexual activity: Yes    Birth control/protection: None  Other Topics Concern  . Not on file  Social History Narrative   Lives with family   Caffeine use: soda sometimes   Right handed    Social Determinants of Health   Financial Resource Strain:   . Difficulty of Paying Living Expenses:   Food Insecurity:   . Worried About Programme researcher, broadcasting/film/video in the Last Year:   . Barista in the Last Year:   Transportation Needs:   . Freight forwarder (Medical):   Marland Kitchen Lack of Transportation (Non-Medical):   Physical Activity:   . Days of Exercise per Week:   . Minutes of Exercise per Session:   Stress:   . Feeling of Stress :   Social Connections:   . Frequency of Communication with Friends and Family:   . Frequency of Social Gatherings with Friends and Family:   . Attends Religious Services:   . Active Member of Clubs or Organizations:   . Attends Banker Meetings:   Marland Kitchen Marital Status:   Intimate Partner Violence:   . Fear of Current or Ex-Partner:   . Emotionally Abused:   Marland Kitchen Physically Abused:   . Sexually Abused:      PHYSICAL EXAM  Vitals:   04/16/19 1337  BP: (!) 173/97  Pulse: 92  Temp: 98 F (36.7 C)  Weight: (!) 350 lb (158.8 kg)  Height: 5\' 7"  (1.702 m)    Body mass index is 54.82 kg/m.  REPEAT BP 145/90 manual   General: The patient is well-developed and well-nourished and in no acute distress.  Abdomen is nontender with normal sounds.  Eyes:  Funduscopic exam shows normal optic discs and retinal vessels.  Neck: No thyromegaly.  The neck is supple, no carotid bruits are noted.  The neck is nontender.  Cardiovascular: The heart has a  regular rate and rhythm with a normal S1 and S2. There were no murmurs, gallops or rubs. Lungs are clear to auscultation.  Skin: Extremities are without rash or edema.  Musculoskeletal: Joints are nontender.  Back is nontender  Neurologic Exam  Mental status: The patient is alert and oriented x 3 at the time of the examination. The patient has apparent normal recent and remote memory, with an apparently normal attention span and concentration ability.   Speech is normal.  Cranial nerves: Extraocular movements are full. Pupils are equal, round, and reactive to light and accomodation.  Visual acuity is 20/30 OD and 20/20-2 OS.   Facial strength was normal.  She had visible myokymia at the corner of the mouth and above the eye on the right..  Trapezius and sternocleidomastoid strength is normal. No dysarthria is noted.  The tongue is midline, and the patient has symmetric elevation of the soft palate. No obvious hearing deficits are noted.  Motor:  Muscle bulk is normal.   Tone is normal. Strength is  5 / 5 in all 4 extremities.   Sensory: She has intact sensation to touch and vibration in the arms and legs. Coordination: Cerebellar testing reveals good finger-nose-finger and heel-to-shin bilaterally.  Gait and station: Station is normal.   Gait is normal. Tandem gait is mildly wide. Romberg is negative.   Reflexes: Deep tendon reflexes are symmetric and normal in arms and increased in legs without clonus.       DIAGNOSTIC DATA (LABS, IMAGING, TESTING) - I reviewed patient records, labs, notes, testing and imaging myself where available.  Lab Results  Component Value Date   WBC 9.0 12/22/2018   HGB 11.5 (L) 12/22/2018   HCT 35.0 (L) 12/22/2018   MCV 88.4 12/22/2018   PLT 293 12/22/2018      Component Value Date/Time   NA 140 12/22/2018 0240   K 3.2 (L) 12/22/2018 0240   CL 104 12/22/2018 0240   CO2 25 12/22/2018 0240   GLUCOSE 93 12/22/2018 0240   BUN 7 12/22/2018 0240    CREATININE 1.01 (H) 12/22/2018 0240   CALCIUM 8.8 (L) 12/22/2018 0240   PROT 6.5 07/17/2018 1012   ALBUMIN 3.7 07/17/2018 1012   AST 10 07/17/2018 1012   ALT 10 07/17/2018 1012   ALKPHOS 62 07/17/2018 1012   BILITOT 0.7 07/17/2018 1012   GFRNONAA >60 12/22/2018 0240   GFRAA >60 12/22/2018 0240     ____________________________________________________________   1. 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 High risk medication use   4. Numbness   5. Urinary urgency   6. Other fatigue   7. Myokymia     1.   Continue Mayzent for now.    I am concerned that the myokymia may be an MS exacerbation.   She does have a known right pons plaque seen 01/2018.   We need to check MRI of the brain to determine if there is breakthrough activity.     Trial of Keppra for myokymia --- if not better, consider Botox.  2.    Stay active and exercise as tolerated. 3.    Modafinil for fatigeu 4.    I will see her when she returns for the screening visit but she should call with any new or worsening neurologic symptoms.   Shameria Trimarco A. Epimenio Foot, MD, [facility] 04/16/2019, 2:07 PM Certified in Neurology, Clinical Neurophysiology,  Sleep Medicine, Pain Medicine and Neuroimaging  [facility] [address], [place], [place] [address] [phone]

## 2019-04-27 ENCOUNTER — Telehealth: Payer: Self-pay | Admitting: Neurology

## 2019-04-27 NOTE — Telephone Encounter (Signed)
[employer] Auth: [id number] exp. 04/27/19 to 05/26/19) order sent to GI. They will reach out to the patient to schedule.

## 2019-04-28 ENCOUNTER — Other Ambulatory Visit: Payer: Self-pay

## 2019-04-29 ENCOUNTER — Ambulatory Visit (INDEPENDENT_AMBULATORY_CARE_PROVIDER_SITE_OTHER): Payer: BC Managed Care – PPO | Admitting: Internal Medicine

## 2019-04-29 ENCOUNTER — Other Ambulatory Visit (HOSPITAL_COMMUNITY)
Admission: RE | Admit: 2019-04-29 | Discharge: 2019-04-29 | Disposition: A | Discharge status: Home / Self Care | Payer: BC Managed Care – PPO | Source: Ambulatory Visit | Attending: Internal Medicine | Admitting: Internal Medicine

## 2019-04-29 ENCOUNTER — Other Ambulatory Visit: Payer: Self-pay | Admitting: Internal Medicine

## 2019-04-29 ENCOUNTER — Encounter: Payer: Self-pay | Admitting: Internal Medicine

## 2019-04-29 VITALS — BP 124/80 | HR 80 | Temp 97.1°F | Ht 67.0 in | Wt 350.9 lb

## 2019-04-29 DIAGNOSIS — G35 Multiple sclerosis: Secondary | ICD-10-CM | POA: Diagnosis not present

## 2019-04-29 DIAGNOSIS — E559 Vitamin D deficiency, unspecified: Secondary | ICD-10-CM | POA: Diagnosis not present

## 2019-04-29 DIAGNOSIS — Z124 Encounter for screening for malignant neoplasm of cervix: Secondary | ICD-10-CM | POA: Diagnosis not present

## 2019-04-29 DIAGNOSIS — R030 Elevated blood-pressure reading, without diagnosis of hypertension: Secondary | ICD-10-CM

## 2019-04-29 LAB — VITAMIN D 25 HYDROXY (VIT D DEFICIENCY, FRACTURES): VITD: 17.44 ng/mL — ABNORMAL LOW (ref 30.00–100.00)

## 2019-04-29 MED ORDER — VITAMIN D (ERGOCALCIFEROL) 1.25 MG (50000 UNIT) PO CAPS: 50000.0000 [IU] | ORAL_CAPSULE | ORAL | 0 refills | Status: AC

## 2019-04-29 NOTE — Addendum Note (Signed)
Addended by: Bonnye Fava on: 04/29/2019 08:59 AM   Modules accepted: Orders

## 2019-04-29 NOTE — Progress Notes (Signed)
Established Patient Office Visit     This visit occurred during the SARS-CoV-2 public health emergency.  Safety protocols were in place, including screening questions prior to the visit, additional usage of staff PPE, and extensive cleaning of exam room while observing appropriate contact time as indicated for disinfecting solutions.    CC/Reason for Visit: check vit D, pap smear, check BP  HPI: Amanda Wise is a 34 y.o. female who is coming in today for the above mentioned reasons. Past Medical History is significant for: MS that has not been well controlled, followed by neurology, vit D def, MO, OSA on CPAP. Was noted to have elevated BP at neurology office and asked to follow up with me. She is also due for a pap smear that she would like done today.   Past Medical/Surgical History: Past Medical History:  Diagnosis Date  . Anemia   . Heart palpitations   . Hypertension   . Pregnancy induced hypertension     Past Surgical History:  Procedure Laterality Date  . NO PAST SURGERIES      Social History:  reports that she has never smoked. She has never used smokeless tobacco. She reports that she does not drink alcohol or use drugs.  Allergies: No Known Allergies  Family History:  Family History  Problem Relation Age of Onset  . Hypertension Other   . Hypertension Maternal Grandmother   . Hypertension Paternal Grandmother   . Diabetes Father   . High blood pressure Father      Current Outpatient Medications:  .  modafinil (PROVIGIL) 200 MG tablet, Take 1 tablet (200 mg total) by mouth daily., Disp: 30 tablet, Rfl: 5 .  Vitamin D, Ergocalciferol, (DRISDOL) 1.25 MG (50000 UT) CAPS capsule, Take 1 capsule (50,000 Units total) by mouth every 7 (seven) days., Disp: 13 capsule, Rfl: 4 .  levETIRAcetam (KEPPRA) 750 MG tablet, Take 1 tablet (750 mg total) by mouth 2 (two) times daily. (Patient not taking: Reported on 04/29/2019), Disp: 120 tablet, Rfl: 11  Review  of Systems:  Constitutional: Denies fever, chills, diaphoresis, appetite change and fatigue.  HEENT: Denies photophobia, eye pain, redness, hearing loss, ear pain, congestion, sore throat, rhinorrhea, sneezing, mouth sores, trouble swallowing, neck pain, neck stiffness and tinnitus.   Respiratory: Denies SOB, DOE, cough, chest tightness,  and wheezing.   Cardiovascular: Denies chest pain, palpitations and leg swelling.  Gastrointestinal: Denies nausea, vomiting, abdominal pain, diarrhea, constipation, blood in stool and abdominal distention.  Genitourinary: Denies dysuria, urgency, frequency, hematuria, flank pain and difficulty urinating.  Endocrine: Denies: hot or cold intolerance, sweats, changes in hair or nails, polyuria, polydipsia. Musculoskeletal: Denies myalgias, back pain, joint swelling, arthralgias and gait problem.  Skin: Denies pallor, rash and wound.  Neurological: Denies dizziness, seizures, syncope, weakness, light-headedness, numbness and headaches.  Hematological: Denies adenopathy. Easy bruising, personal or family bleeding history  Psychiatric/Behavioral: Denies suicidal ideation, mood changes, confusion, nervousness, sleep disturbance and agitation    Physical Exam: Vitals:   04/29/19 0828  BP: 124/80  Pulse: 80  Temp: (!) 97.1 F (36.2 C)  TempSrc: Temporal  SpO2: 98%  Weight: (!) 350 lb 14.4 oz (159.2 kg)  Height: 5\' 7"  (1.702 m)    Body mass index is 54.96 kg/m.   Constitutional: NAD, calm, comfortable, obese Eyes: PERRL, lids and conjunctivae normal ENMT: Mucous membranes are moist.  Respiratory: clear to auscultation bilaterally, no wheezing, no crackles. Normal respiratory effort. No accessory muscle use.  Cardiovascular: Regular rate  and rhythm, no murmurs / rubs / gallops. No extremity edema.  Abdomen: no tenderness, no masses palpated. No hepatosplenomegaly. Bowel sounds positive.  Psychiatric: Normal judgment and insight. Alert and oriented x 3.  Normal mood.    Impression and Plan:  Screening for cervical cancer  - Plan: PAP [[place]]  Morbid obesity (HCC) -Discussed healthy lifestyle, including increased physical activity and better food choices to promote weight loss.  Vitamin D deficiency - Plan: VITAMIN D 25 Hydroxy (Vit-D Deficiency, Fractures)  Multiple sclerosis (HCC) -Followed by neurology.  Elevated BP without diagnosis of hypertension -Well controlled in office today. -Has not been diagnosed as hypertensive, is not on antihypertensive medications.    Patient Instructions  -Nice seeing you today!!  -Lab work today; will notify you once results are available.  -Schedule follow up in 6 months for your physical.     Chaya Jan, MD [facility]

## 2019-04-29 NOTE — Patient Instructions (Signed)
-  Nice seeing you today!!  -Lab work today; will notify you once results are available.  -Schedule follow up in 6 months for your physical. 

## 2019-05-01 LAB — CYTOLOGY - PAP
Comment: NEGATIVE
Diagnosis: NEGATIVE
High risk HPV: NEGATIVE

## 2019-05-23 ENCOUNTER — Ambulatory Visit
Admission: RE | Admit: 2019-05-23 | Discharge: 2019-05-23 | Disposition: A | Discharge status: Home / Self Care | Payer: BC Managed Care – PPO | Source: Ambulatory Visit | Attending: Neurology | Admitting: Neurology

## 2019-05-23 DIAGNOSIS — G35 Multiple sclerosis: Secondary | ICD-10-CM

## 2019-05-23 MED ORDER — GADOBENATE DIMEGLUMINE 529 MG/ML IV SOLN
20.0000 mL | Freq: Once | INTRAVENOUS | Status: AC | PRN
  Administered 2019-05-23: 20 mL via INTRAVENOUS

## 2019-06-04 ENCOUNTER — Telehealth: Payer: Self-pay | Admitting: *Deleted

## 2019-06-04 NOTE — Telephone Encounter (Signed)
Faxed completed/signed Mayzent Start form to [phone]. Received fax confirmation.  Pt already established on medication via exchange study with research. She is on the 2mg  po qd. Pt last day of being in research study is on 06/09/2019. Requested both titration/maintenance be sent to pt in case she does not get medication in time.

## 2019-06-09 NOTE — Telephone Encounter (Signed)
Submitted urgent PA on CMM. Key: [id number].  CaseId:[id number];Status:Approved;Review Type:Prior Auth;Coverage Start Date:05/10/2019;Coverage End Date:06/08/2020;

## 2019-06-22 NOTE — Telephone Encounter (Signed)
I called pt. She has mayzent from the research trial. [facility] and [employer] have been in touch with her. She has not received a shipment from [facility] yet but I provided her the phone number to call if she needs it. Pt will let us know if she runs low on mayzent and has not received a shipment from [facility].

## 2019-07-15 ENCOUNTER — Telehealth: Payer: Self-pay | Admitting: Neurology

## 2019-07-15 NOTE — Telephone Encounter (Signed)
Called pt back to get more information. She is getting headaches, they worsen in the heat. She cannot see out of her right eye right now, it is blurry. Denies eye pain currently but does have sharp pain once daily in just her right eye. This has been going since end of January but symptoms have started to get worse. It is getting to the point where she cannot see out of her eye. She went to eye doctor previously to be screened for macular edema prior to initiating mayzent but has not had appt since.  Denies any signs of infection. Confirmed she is still Mayzent, denies missing any doses. Advised I will discuss with MD and call back with recommendation.

## 2019-07-15 NOTE — Telephone Encounter (Signed)
Spoke with Dr. Epimenio Foot. He would like to work patient in tomorrow at 930am, check in 9am. I called pt. She accepted. I scheduled pt.

## 2019-07-15 NOTE — Telephone Encounter (Signed)
Pt called stating that she is having an MS flare up and she is wanting to be advised on what can be done to help her with this. Please advise.

## 2019-07-16 ENCOUNTER — Encounter: Payer: Self-pay | Admitting: Neurology

## 2019-07-16 ENCOUNTER — Other Ambulatory Visit: Payer: Self-pay

## 2019-07-16 ENCOUNTER — Ambulatory Visit (INDEPENDENT_AMBULATORY_CARE_PROVIDER_SITE_OTHER): Payer: BC Managed Care – PPO | Admitting: Neurology

## 2019-07-16 VITALS — BP 170/103 | HR 81 | Ht 67.0 in | Wt 360.0 lb

## 2019-07-16 DIAGNOSIS — G35 Multiple sclerosis: Secondary | ICD-10-CM

## 2019-07-16 DIAGNOSIS — Z79899 Other long term (current) drug therapy: Secondary | ICD-10-CM

## 2019-07-16 DIAGNOSIS — H469 Unspecified optic neuritis: Secondary | ICD-10-CM | POA: Insufficient documentation

## 2019-07-16 DIAGNOSIS — R5383 Other fatigue: Secondary | ICD-10-CM

## 2019-07-16 MED ORDER — PREDNISONE 50 MG PO TABS: ORAL_TABLET | ORAL | 0 refills | Status: DC

## 2019-07-16 NOTE — Progress Notes (Signed)
[facility]  PATIENT: Amanda Wise DOB: [birth year]  REFERRING DOCTOR OR PCP: Roland Rack SOURCE: Patient, notes from emergency room and hospital admission, laboratory reports, imaging reports, MRI images personally reviewed.  _________________________________   HISTORICAL  CHIEF COMPLAINT:  Chief Complaint  Patient presents with  . Follow-up    RM 12, alone. Last seen 04/16/2019.   . Multiple Sclerosis    On Mayzent. Here to be evaluated for right eye vision problems. Reports she has daily blurry vision, hard to see in this eye. This has been going on since end of January.     HISTORY OF PRESENT ILLNESS:  Amanda Wise is a 34 y.o. woman who was diagnosed with multiple sclerosis in December 2019.  Update 07/16/19: She has been on siponimod x 5 months.   MRI brain in April showed no new MS lesions.  he is noting more issues with her vision.  She notes the right vision was slightly worse a few months ago but became much worse around 07/11/2019 OD.   Colors are more washed out OD.  She notes reduced ability to read OD.    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   Strength, sensation and gait are all stable.   Notes fatigue and mild cognitive issues (unchanged).   Mood is better than earlier in the year.    Update 04/16/2019: She started in the siponimod (Mayzent) clinical trial.    She is noting right sided facial twitching over the last week.   The twitching is in the corner of the mouth and eyelid.  It is near continuous in waves.     She notes right visual blurriness.  She feels her gait has done well.  She notes no new numbness or weakness.  Bladder function is doing well.  Fatigue is worse again   She felt some benefit from phentermine but she sometimes felt she crashed in the early to mid afternoon.   Mood and cognition are doing well.  Update 10/02/2018: Her insurance will not continue to approve Tysabri infusions.  Her last infusion was at the end of May.   Therefore, we need to switch to a different disease modifying therapy.  Her insurance will also not cover Ocrevus.  We discussed that the strongest oral agents are the Gilenya, Mayzent and Zeposia.  She appears to qualify for the open label Mayzent study that we are participating in that we will ensure that she can get started without insurance concerns.  We went over the risks and benefits Mayzent including possibility of higher risk of infection, macular edema and heart rate slowing with potential for heart blocks.  Otherwise, she feels stable.  Specifically, her gait has improved compared to earlier this year and is close to baseline.  She no longer has any clumsiness.  She does get some muscle spasticity at times more on the left.  He denies numbness.  She feels vision is the same as last visit.  She notes some fatigue but no excessive daytime sleepiness.  If fatigue is severe enough that it prevents her from doing something she needs to do or wants to do.  She continues to experience difficulties with reduced attention and focus and she makes mistakes at times.  Update 07/15/2018 (Virtual) She feels stable with no MS exacerbations.   She will be having her 4th Tysabri infusion soon. Sheis tolerating it well.  Her symptoms improved.   She is walking well.  Strength is ok but she notes a lot of  muscle spasms (legs, upper arms and back) and some twitching.  She has some tingling in her legs at times.   Bladder function is doing better.  She notes some depression  She is tired but is not excessively sleepy.   She sleeps only 4-5 hours some nights due to schedule and 2 young kids.   She is less sleepy.   She notes some nights she doesn't use it more than 4 hours as she sometimes takes it off without knowing.   Nights she uses it the entire night she feels much better then next day.    She tried phentermine but had trouble dieting and has not lost any weight.      She denies major cognitive issues but feels  cognitively slower this year than last year.   Learning new things at wo  FROM 02/19/2018: She is a 34 yo woman who had an episode of bilateral leg weakness in 2012, she improved over 4-5 months.  She felt symptoms were due to working out.  In 2016, she had the onset of poor balance x 3-6 months that completely resolved.    She never sought out medical/neurologic care.  In August 2019, she had some weakness and numbness in her legs for a couple weeks but then improved after she saw a Land.    In December 2019,  she had the onset of tingling in both hands and right hand weakness.   She went to the ED.  She had MRI of the brain and MRI of the cervical spine and they were consistent with MS.  She was admitted and received 5 days of IV Solu-Medrol.   She felt better by her discharge, closer to her baseline.      Currently, she notes bilateral mild hand numbness.  Legs have no numbness.   She notes mild shoulder weakness but no leg weakness.   She notes very mild clumsiness in her legs bilaterally but no clumsiness in her hands/arms.    She has urinary frequency and urgency x 1 year. This started while pregnant so she did not think much about it.    She sometimes noted mild blurry vision on the left but has no difference in color vision between the eyes.     She is fatigued and sometimes apathetic.      She feels cognition is mildly cloudy the last couple years and she sometimes has trouble coming up with the right words.     She denies any difficulty with mood but gets frustrated easily.      She notes that her sleep is poor and she sometimes wakes up gasping for air.  She snores heavily and people have told her that she has snorts and gasps in her sleep consistent with OSA.  She has never had a sleep study.  I personally reviewed the MRI of the brain and cervical spine dated 01/30/2018.  The brain shows multiple T2/flair hyperintense foci in the hemispheres in the periventricular, juxtacortical and  deep white matter.  There are 2 foci peripherally in the pons and another focus in the left middle cerebellar peduncle.  The one pontine focus enhances.  2 small foci in the hemispheres enhance.  The MRI of the cervical spine shows several foci in the spinal cord including 1 at C3 to the left and 1 at C6-C7 on the right that enhance.    The other foci located at C2-C3 posteriorly and C3 posteriorly to the right.  She  does not have DM but had hyperglycemia with the steroids.      REVIEW OF SYSTEMS: Constitutional: No fevers, chills, sweats, or change in appetite Eyes: No visual changes, double vision, eye pain Ear, nose and throat: No hearing loss, ear pain, nasal congestion, sore throat Cardiovascular: No chest pain, palpitations Respiratory: No shortness of breath at rest or with exertion.   No wheezes GastrointestinaI: No nausea, vomiting, diarrhea, abdominal pain, fecal incontinence Genitourinary: No dysuria, urinary retention or frequency.  No nocturia. Musculoskeletal: No neck pain, back pain Integumentary: No rash, pruritus, skin lesions Neurological: as above Psychiatric: No depression at this time.  No anxiety Endocrine: No palpitations, diaphoresis, change in appetite, change in weigh or increased thirst Hematologic/Lymphatic: No anemia, purpura, petechiae. Allergic/Immunologic: No itchy/runny eyes, nasal congestion, recent allergic reactions, rashes  ALLERGIES: No Known Allergies  HOME MEDICATIONS:  Current Outpatient Medications:  .  Siponimod Fumarate (MAYZENT) 2 MG TABS, Take 1 tablet by mouth daily. Mayzent, Disp: , Rfl:  .  Vitamin D, Ergocalciferol, (DRISDOL) 1.25 MG (50000 UNIT) CAPS capsule, Take 1 capsule (50,000 Units total) by mouth every 7 (seven) days for 12 doses., Disp: 12 capsule, Rfl: 0 .  Vitamin D, Ergocalciferol, (DRISDOL) 1.25 MG (50000 UT) CAPS capsule, Take 1 capsule (50,000 Units total) by mouth every 7 (seven) days., Disp: 13 capsule, Rfl: 4 .   predniSONE (DELTASONE) 50 MG tablet, Take 12 po qd x 3 days.     High dose prednisone for MS relapse, Disp: 36 tablet, Rfl: 0  PAST MEDICAL HISTORY: Past Medical History:  Diagnosis Date  . Anemia   . Heart palpitations   . Hypertension   . Pregnancy induced hypertension     PAST SURGICAL HISTORY: Past Surgical History:  Procedure Laterality Date  . NO PAST SURGERIES      FAMILY HISTORY: Family History  Problem Relation Age of Onset  . Hypertension Other   . Hypertension Maternal Grandmother   . Hypertension Paternal Grandmother   . Diabetes Father   . High blood pressure Father     SOCIAL HISTORY:  Social History   Socioeconomic History  . Marital status: Single    Spouse name: Not on file  . Number of children: 2  . Years of education: 39  . Highest education level: Not on file  Occupational History  . Occupation: Orthoptist  Tobacco Use  . Smoking status: Never Smoker  . Smokeless tobacco: Never Used  Substance and Sexual Activity  . Alcohol use: No  . Drug use: No  . Sexual activity: Yes    Birth control/protection: None  Other Topics Concern  . Not on file  Social History Narrative   Lives with family   Caffeine use: soda sometimes   Right handed    Social Determinants of Health   Financial Resource Strain:   . Difficulty of Paying Living Expenses:   Food Insecurity:   . Worried About Programme researcher, broadcasting/film/video in the Last Year:   . Barista in the Last Year:   Transportation Needs:   . Freight forwarder (Medical):   Marland Kitchen Lack of Transportation (Non-Medical):   Physical Activity:   . Days of Exercise per Week:   . Minutes of Exercise per Session:   Stress:   . Feeling of Stress :   Social Connections:   . Frequency of Communication with Friends and Family:   . Frequency of Social Gatherings with Friends and Family:   . Attends Religious  Services:   . Active Member of Clubs or Organizations:   . Attends Banker  Meetings:   Marland Kitchen Marital Status:   Intimate Partner Violence:   . Fear of Current or Ex-Partner:   . Emotionally Abused:   Marland Kitchen Physically Abused:   . Sexually Abused:      PHYSICAL EXAM  Vitals:   07/16/19 0908  BP: (!) 170/103  Pulse: 81  Weight: (!) 360 lb (163.3 kg)  Height: 5\' 7"  (1.702 m)    Body mass index is 56.38 kg/m.   Hearing Screening   125Hz  250Hz  500Hz  1000Hz  2000Hz  3000Hz  4000Hz  6000Hz  8000Hz   Right ear:           Left ear:             Visual Acuity Screening   Right eye Left eye Both eyes  Without correction: 20/100 20/30 20/20  With correction:        General: The patient is well-developed and well-nourished and in no acute distress.  Abdomen is nontender with normal sounds.  Eyes:  Funduscopic exam shows normal optic discs and retinal vessels.  Neck: No thyromegaly.  The neck is supple, no carotid bruits are noted.  The neck is nontender.  Cardiovascular: The heart has a regular rate and rhythm with a normal S1 and S2. There were no murmurs, gallops or rubs. Lungs are clear to auscultation.  Skin: Extremities are without rash or edema.  Musculoskeletal: Joints are nontender.  Back is nontender  Neurologic Exam  Mental status: The patient is alert and oriented x 3 at the time of the examination. The patient has apparent normal recent and remote memory, with an apparently normal attention span and concentration ability.   Speech is normal.  Cranial nerves: Extraocular movements are full.  She has a mild right APD.  Facial strength was normal.  She had visible myokymia at the corner of the mouth and above the eye on the right..  Trapezius and sternocleidomastoid strength is normal. No dysarthria is noted.  Hearing appears normal and symmetric Motor:  Muscle bulk is normal.   Tone is normal. Strength is  5 / 5 in all 4 extremities.   Sensory: She has intact sensation to touch and vibration in the arms and legs. Coordination: Cerebellar testing reveals  good finger-nose-finger and heel-to-shin bilaterally.  Gait and station: Station is normal.   Gait is normal. Tandem gait is mildly wide. Romberg is negative.   Reflexes: Deep tendon reflexes are symmetric and normal in arms and increased in legs without clonus.       DIAGNOSTIC DATA (LABS, IMAGING, TESTING) - I reviewed patient records, labs, notes, testing and imaging myself where available.  Lab Results  Component Value Date   WBC 9.0 12/22/2018   HGB 11.5 (L) 12/22/2018   HCT 35.0 (L) 12/22/2018   MCV 88.4 12/22/2018   PLT 293 12/22/2018      Component Value Date/Time   NA 140 12/22/2018 0240   K 3.2 (L) 12/22/2018 0240   CL 104 12/22/2018 0240   CO2 25 12/22/2018 0240   GLUCOSE 93 12/22/2018 0240   BUN 7 12/22/2018 0240   CREATININE 1.01 (H) 12/22/2018 0240   CALCIUM 8.8 (L) 12/22/2018 0240   PROT 6.5 07/17/2018 1012   ALBUMIN 3.7 07/17/2018 1012   AST 10 07/17/2018 1012   ALT 10 07/17/2018 1012   ALKPHOS 62 07/17/2018 1012   BILITOT 0.7 07/17/2018 1012   GFRNONAA >60 12/22/2018 0240  GFRAA >60 12/22/2018 0240     ____________________________________________________________   1. Multiple sclerosis (HCC)   2. High risk medication use   3. Other fatigue   4. Optic neuritis     1.    Visual Exacerbation with change of OD from 20/30 to 20/100.   Continue Mayzent for now.  However will switch if another relapse occurs  .  If vision not better by Monday, she should call ophthalmology to rule out macula edema and other ocular etiology.   2.    Stay active and exercise as tolerated. 3.    High dose prednisone 600 mg x 3 days 4.    I will see her when she returns for the July research  visit but she should call with any new or worsening neurologic symptoms.   Burleigh Brockmann A. Epimenio Foot, MD, [facility] 07/16/2019, 10:19 AM Certified in Neurology, Clinical Neurophysiology, Sleep Medicine, Pain Medicine and Neuroimaging  [facility] [address] [place], [place] [address] [phone]

## 2019-08-05 ENCOUNTER — Other Ambulatory Visit: Payer: BC Managed Care – PPO

## 2019-08-05 ENCOUNTER — Other Ambulatory Visit: Payer: Self-pay

## 2019-08-05 DIAGNOSIS — E559 Vitamin D deficiency, unspecified: Secondary | ICD-10-CM

## 2019-08-05 NOTE — Addendum Note (Signed)
Addended by: Lerry Liner on: 08/05/2019 08:14 AM   Modules accepted: Orders

## 2019-10-30 ENCOUNTER — Encounter: Payer: BC Managed Care – PPO | Admitting: Internal Medicine

## 2019-12-01 ENCOUNTER — Telehealth: Payer: Self-pay | Admitting: *Deleted

## 2019-12-01 ENCOUNTER — Encounter (HOSPITAL_BASED_OUTPATIENT_CLINIC_OR_DEPARTMENT_OTHER): Payer: Self-pay | Admitting: *Deleted

## 2019-12-01 ENCOUNTER — Other Ambulatory Visit: Payer: Self-pay

## 2019-12-01 ENCOUNTER — Telehealth (INDEPENDENT_AMBULATORY_CARE_PROVIDER_SITE_OTHER): Payer: BC Managed Care – PPO | Admitting: Internal Medicine

## 2019-12-01 ENCOUNTER — Emergency Department (HOSPITAL_BASED_OUTPATIENT_CLINIC_OR_DEPARTMENT_OTHER)
Admission: EM | Admit: 2019-12-01 | Discharge: 2019-12-01 | Disposition: A | Discharge status: Home / Self Care | Payer: BC Managed Care – PPO | Attending: Emergency Medicine | Admitting: Emergency Medicine

## 2019-12-01 DIAGNOSIS — R110 Nausea: Secondary | ICD-10-CM | POA: Diagnosis not present

## 2019-12-01 DIAGNOSIS — I10 Essential (primary) hypertension: Secondary | ICD-10-CM | POA: Diagnosis not present

## 2019-12-01 DIAGNOSIS — R002 Palpitations: Secondary | ICD-10-CM | POA: Diagnosis not present

## 2019-12-01 HISTORY — DX: Multiple sclerosis: G35

## 2019-12-01 HISTORY — DX: Multiple sclerosis, unspecified: G35.D

## 2019-12-01 LAB — CBC WITH DIFFERENTIAL/PLATELET
Abs Immature Granulocytes: 0.02 10*3/uL (ref 0.00–0.07)
Basophils Absolute: 0 10*3/uL (ref 0.0–0.1)
Basophils Relative: 1 %
Eosinophils Absolute: 0.2 10*3/uL (ref 0.0–0.5)
Eosinophils Relative: 5 %
HCT: 38.3 % (ref 36.0–46.0)
Hemoglobin: 13.2 g/dL (ref 12.0–15.0)
Immature Granulocytes: 1 %
Lymphocytes Relative: 10 %
Lymphs Abs: 0.4 10*3/uL — ABNORMAL LOW (ref 0.7–4.0)
MCH: 30.4 pg (ref 26.0–34.0)
MCHC: 34.5 g/dL (ref 30.0–36.0)
MCV: 88.2 fL (ref 80.0–100.0)
Monocytes Absolute: 0.7 10*3/uL (ref 0.1–1.0)
Monocytes Relative: 16 %
Neutro Abs: 2.8 10*3/uL (ref 1.7–7.7)
Neutrophils Relative %: 67 %
Platelets: 271 10*3/uL (ref 150–400)
RBC: 4.34 MIL/uL (ref 3.87–5.11)
RDW: 13.2 % (ref 11.5–15.5)
WBC: 4.1 10*3/uL (ref 4.0–10.5)
nRBC: 0 % (ref 0.0–0.2)

## 2019-12-01 LAB — BASIC METABOLIC PANEL
Anion gap: 8 (ref 5–15)
BUN: 10 mg/dL (ref 6–20)
CO2: 26 mmol/L (ref 22–32)
Calcium: 8.8 mg/dL — ABNORMAL LOW (ref 8.9–10.3)
Chloride: 103 mmol/L (ref 98–111)
Creatinine, Ser: 0.86 mg/dL (ref 0.44–1.00)
GFR, Estimated: >60 mL/min (ref >60)
Glucose, Bld: 87 mg/dL (ref 70–99)
Potassium: 3.8 mmol/L (ref 3.5–5.1)
Sodium: 137 mmol/L (ref 135–145)

## 2019-12-01 LAB — MAGNESIUM: Magnesium: 1.9 mg/dL (ref 1.7–2.4)

## 2019-12-01 NOTE — Progress Notes (Signed)
    Virtual Visit via Telephone Note  I connected with Amanda Wise on 12/01/19 at  3:30 PM EDT by telephone and verified that I am speaking with the correct person using two identifiers.   I discussed the limitations, risks, security and privacy concerns of performing an evaluation and management service by telephone and the availability of in person appointments. I also discussed with the patient that there may be a patient responsible charge related to this service. The patient expressed understanding and agreed to proceed.  Location patient: home Location provider: work office Participants present for the call: patient, provider Patient did not have a visit in the prior 7 days to address this/these issue(s).   History of Present Illness:  She has scheduled this phone visit to discuss palpitations.  Because she had increasing palpitations she went to the ED today.  I have reviewed her ED visits, labs are relatively normal, EKG was without arrhythmia.  EDP thought that possibly her MS medications could be contributing to arrhythmia.  She has an appointment tomorrow with her neurologist to discuss.  She has not had any further palpitations since her visit this morning.   Observations/Objective: Patient sounds cheerful and well on the phone. I do not appreciate any increased work of breathing. Speech and thought processing are grossly intact. Patient reported vitals: None reported   Current Outpatient Medications:  .  Siponimod Fumarate (MAYZENT) 2 MG TABS, Take 1 tablet by mouth daily. Mayzent, Disp: , Rfl:  .  Vitamin D, Ergocalciferol, (DRISDOL) 1.25 MG (50000 UT) CAPS capsule, Take 1 capsule (50,000 Units total) by mouth every 7 (seven) days., Disp: 13 capsule, Rfl: 4  Review of Systems:  Constitutional: Denies fever, chills, diaphoresis, appetite change and fatigue.  HEENT: Denies photophobia, eye pain, redness, hearing loss, ear pain, congestion, sore throat,  rhinorrhea, sneezing, mouth sores, trouble swallowing, neck pain, neck stiffness and tinnitus.   Respiratory: Denies SOB, DOE, cough, chest tightness,  and wheezing.   Cardiovascular: Denies chest pain and leg swelling.  Gastrointestinal: Denies nausea, vomiting, abdominal pain, diarrhea, constipation, blood in stool and abdominal distention.  Genitourinary: Denies dysuria, urgency, frequency, hematuria, flank pain and difficulty urinating.  Endocrine: Denies: hot or cold intolerance, sweats, changes in hair or nails, polyuria, polydipsia. Musculoskeletal: Denies myalgias, back pain, joint swelling, arthralgias and gait problem.  Skin: Denies pallor, rash and wound.  Neurological: Denies dizziness, seizures, syncope, weakness, light-headedness, numbness and headaches.  Hematological: Denies adenopathy. Easy bruising, personal or family bleeding history  Psychiatric/Behavioral: Denies suicidal ideation, mood changes, confusion, nervousness, sleep disturbance and agitation   Assessment and Plan:  Palpitations  -ED work-up has been reviewed. -If still having issues in the next 10 to 14 days she will contact me for consideration of an event monitor placement. -She will call neurology's office for appointment.  I discussed the assessment and treatment plan with the patient. The patient was provided an opportunity to ask questions and all were answered. The patient agreed with the plan and demonstrated an understanding of the instructions.   The patient was advised to call back or seek an in-person evaluation if the symptoms worsen or if the condition fails to improve as anticipated.  I provided 13 minutes of non-face-to-face time during this encounter.   Chaya Jan, MD [facility]

## 2019-12-01 NOTE — ED Provider Notes (Signed)
[facility] EMERGENCY DEPARTMENT Provider Note   CSN: [id number] Arrival date & time: 12/01/19  0247     History Chief Complaint  Patient presents with  . Palpitations    Amanda Wise is a 34 y.o. female.  The history is provided by the patient.  Palpitations Palpitations quality:  Slow Onset quality:  Sudden Duration:  3 days Timing:  Constant Progression:  Worsening Chronicity:  New Relieved by:  None tried Worsened by:  Nothing Associated symptoms: nausea   Associated symptoms: no chest pain, no lower extremity edema, no shortness of breath, no syncope and no vomiting   Risk factors: no heart disease and no hx of PE   Patient reports she has had feeling that her heart is dropping over the past 3 days.  She reports this occurred at work.  She reports she feels like her heart is bottoming out, and she feels nausea.  No chest or abdominal pain.  No shortness of breath.  No syncope.  She has had this previously but this is been ongoing for 3 days now.  No new medications, but she has been informed that her current MS medications could cause heart issues      Past Medical History:  Diagnosis Date  . Anemia   . Heart palpitations   . Hypertension   . MS (multiple sclerosis) (HCC)   . Pregnancy induced hypertension     Patient Active Problem List   Diagnosis Date Noted  . Optic neuritis 07/16/2019  . 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 Attention deficit 10/02/2018  . Other fatigue 10/02/2018  . Morbid obesity (HCC) 07/17/2018  . High risk medication use 02/19/2018  . OSA (obstructive sleep apnea) 02/19/2018  . Vitamin D deficiency 02/19/2018  . Urinary urgency 02/19/2018  . Numbness   . Multiple sclerosis (HCC) 01/30/2018  . Labor and delivery indication for care or intervention 08/07/2016  . Spontaneous vaginal delivery 08/07/2016  . Positive GBS test 03/06/2015  . Vaginal delivery 03/06/2015  .  Antepartum non-reassuring fetal heart rate or rhythm affecting care of mother 03/04/2015  . Status post fall 03/03/2015  . Abnormal antenatal test 03/03/2015    Past Surgical History:  Procedure Laterality Date  . NO PAST SURGERIES       OB History    Gravida  2   Para  2   Term  2   Preterm      AB      Living  2     SAB      TAB      Ectopic      Multiple  0   Live Births  2           Family History  Problem Relation Age of Onset  . Hypertension Other   . Hypertension Maternal Grandmother   . Hypertension Paternal Grandmother   . Diabetes Father   . High blood pressure Father     Social History   Tobacco Use  . Smoking status: Never Smoker  . Smokeless tobacco: Never Used  Substance Use Topics  . Alcohol use: No  . Drug use: No    Home Medications Prior to Admission medications   Medication Sig Start Date End Date Taking? Authorizing Provider  Siponimod Fumarate (MAYZENT) 2 MG TABS Take 1 tablet by mouth daily. Mayzent    [provider]  Vitamin D, Ergocalciferol, (DRISDOL) 1.25 MG (50000 UT) CAPS capsule Take  1 capsule (50,000 Units total) by mouth every 7 (seven) days. 02/20/18   Sater, Pearletha Furl, MD    Allergies    Patient has no known allergies.  Review of Systems   Review of Systems  Constitutional: Negative for fever.  Respiratory: Negative for shortness of breath.   Cardiovascular: Positive for palpitations. Negative for chest pain, leg swelling and syncope.  Gastrointestinal: Positive for nausea. Negative for vomiting.  Neurological: Negative for syncope.  All other systems reviewed and are negative.   Physical Exam Updated Vital Signs BP (!) 141/98 (BP Location: Right Arm)   Pulse 72   Temp 97.8 F (36.6 C) (Oral)   Resp 18   Ht 1.702 m (5\' 7" )   Wt (!) 150.1 kg   LMP 09/29/2019   SpO2 100%   BMI 51.84 kg/m   Physical Exam CONSTITUTIONAL: Well developed/well nourished HEAD:  Normocephalic/atraumatic EYES: EOMI/PERRL ENMT: Mucous membranes moist NECK: supple no meningeal signs SPINE/BACK:entire spine nontender CV: S1/S2 noted, no murmurs/rubs/gallops noted LUNGS: Lungs are clear to auscultation bilaterally, no apparent distress ABDOMEN: soft, nontender, no rebound or guarding, bowel sounds noted throughout abdomen GU:no cva tenderness NEURO: Pt is awake/alert/appropriate, moves all extremitiesx4.  No facial droop.   EXTREMITIES: pulses normal/equalx4, full ROM, no lower extremity edema SKIN: warm, color normal PSYCH: no abnormalities of mood noted, alert and oriented to situation  ED Results / Procedures / Treatments   Labs (all labs ordered are listed, but only abnormal results are displayed) Labs Reviewed  BASIC METABOLIC PANEL - Abnormal; Notable for the following components:      Result Value   Calcium 8.8 (*)    All other components within normal limits  CBC WITH DIFFERENTIAL/PLATELET - Abnormal; Notable for the following components:   Lymphs Abs 0.4 (*)    All other components within normal limits  MAGNESIUM    EKG EKG Interpretation  Date/Time:  Tuesday December 01 2019 02:57:29 EDT Ventricular Rate:  71 PR Interval:    QRS Duration: 97 QT Interval:  371 QTC Calculation: 404 R Axis:   59 Text Interpretation: Sinus rhythm No significant change since last tracing Confirmed by [id number] (Zadie Rhine) on 12/01/2019 3:09:43 AM   Radiology No results found.  Procedures Procedures Medications Ordered in ED Medications - No data to display  ED Course  I have reviewed the triage vital signs and the nursing notes.  Pertinent labs  results that were available during my care of the patient were reviewed by me and considered in my medical decision making (see chart for details).    MDM Rules/Calculators/A&P                          3:42 AM Patient presents with palpitations, reporting that her heart is bottoming out.  She reports is  been ongoing for 3 days.  Reports her heart rate is typically in the 70s, but her Fitbit has noted her lowest heart rate is 61 She reports she is managed on Mayzent for MS for up to 5 months and it appears that bradycardia/arrhythmia is a complication of this drug, but at this point there is no significant bradycardia.  She reports having issues with potassium/magnesium previously, this will be evaluated Low suspicion for ACS/PE at this time 5:16 AM BP (!) 138/91 (BP Location: Right Arm)   Pulse 63   Temp 98.1 F (36.7 C) (Oral)   Resp 17   Ht 1.702 m (5\' 7" )  Wt (!) 150.1 kg   LMP 09/29/2019   SpO2 100%   BMI 51.84 kg/m  Pt improved Lowest HR was around 59.  No AV block or dysrhythmia She feels well for d/c home Will f/u with neuro about her meds and if this could be related She will also need outpatient cardiac monitoring  Final Clinical Impression(s) / ED Diagnoses Final diagnoses:  Palpitations    Rx / DC Orders ED Discharge Orders    None       Zadie Rhine, MD 12/01/19 [phone]

## 2019-12-01 NOTE — Telephone Encounter (Signed)
Tried calling pt to schedule an appt per Dr. Epimenio Foot request in the next 1-2 weeks post ED visit.  If still open, can offer appt tomorrow (12/02/19) at 830am with Dr. Epimenio Foot if she calls

## 2019-12-01 NOTE — ED Triage Notes (Signed)
Pt reports 3 days of palpitations, states nausea without vomiting. Denies SOB. Reports intermittent dizziness.  Denies tenderness on palpation.  Reports left arm pain that has subsided at this time. Ambulatory without difficulty

## 2019-12-02 NOTE — Telephone Encounter (Signed)
Called, LVM for pt to call office to schedule follow up per Dr. Epimenio Foot request

## 2019-12-04 ENCOUNTER — Encounter: Payer: Self-pay | Admitting: *Deleted

## 2019-12-04 NOTE — Telephone Encounter (Signed)
Left patient a second message on her voicemail requesting her to call to schedule follow up. Left message on voicemails of both significant other and mother on DPR. Also, sent patient a mychart message.

## 2019-12-08 ENCOUNTER — Encounter: Payer: Self-pay | Admitting: *Deleted

## 2019-12-08 NOTE — Telephone Encounter (Signed)
Sent unable to contact letter to pt via mail. Asked her to call office to schedule appt.

## 2019-12-15 ENCOUNTER — Telehealth: Payer: Self-pay | Admitting: Neurology

## 2019-12-15 NOTE — Telephone Encounter (Signed)
I called the patient.  If she has only missed 4 doses of the medication, should be able to go back on the medication without a dose titration.  I talked with her concerning this.

## 2019-12-15 NOTE — Telephone Encounter (Signed)
Pt called to inform the RN and Provider that she has missed 4 dosages of her Siponimod Fumarate (MAYZENT) 2 MG TABS and she states that she was told to inform the office if that were to happen because she would have to do a titration. Please advise.

## 2019-12-24 ENCOUNTER — Ambulatory Visit: Payer: BC Managed Care – PPO | Admitting: Neurology

## 2019-12-24 ENCOUNTER — Ambulatory Visit (INDEPENDENT_AMBULATORY_CARE_PROVIDER_SITE_OTHER): Payer: BC Managed Care – PPO | Admitting: Internal Medicine

## 2019-12-24 ENCOUNTER — Other Ambulatory Visit: Payer: Self-pay

## 2019-12-24 ENCOUNTER — Encounter: Payer: Self-pay | Admitting: Internal Medicine

## 2019-12-24 VITALS — BP 130/74 | HR 83 | Temp 98.3°F | Ht 67.0 in | Wt 339.9 lb

## 2019-12-24 DIAGNOSIS — Z0000 Encounter for general adult medical examination without abnormal findings: Secondary | ICD-10-CM

## 2019-12-24 DIAGNOSIS — E559 Vitamin D deficiency, unspecified: Secondary | ICD-10-CM

## 2019-12-24 DIAGNOSIS — G4733 Obstructive sleep apnea (adult) (pediatric): Secondary | ICD-10-CM | POA: Diagnosis not present

## 2019-12-24 DIAGNOSIS — G35 Multiple sclerosis: Secondary | ICD-10-CM

## 2019-12-24 DIAGNOSIS — Z23 Encounter for immunization: Secondary | ICD-10-CM | POA: Diagnosis not present

## 2019-12-24 NOTE — Addendum Note (Signed)
Addended by: Kern Reap B on: 12/24/2019 05:23 PM   Modules accepted: Orders

## 2019-12-24 NOTE — Progress Notes (Signed)
Established Patient Office Visit     This visit occurred during the SARS-CoV-2 public health emergency.  Safety protocols were in place, including screening questions prior to the visit, additional usage of staff PPE, and extensive cleaning of exam room while observing appropriate contact time as indicated for disinfecting solutions.    CC/Reason for Visit: Annual preventive exam  HPI: Amanda Wise is a 34 y.o. female who is coming in today for the above mentioned reasons. Past Medical History is significant for: Multiple sclerosis followed by neurology, morbid obesity, obstructive sleep apnea on nightly CPAP and vitamin D deficiency.  She has no acute complaints today.  She has been able to lose 20 pounds since her last visit in June.  She had a Pap smear in March.  She is requesting a flu vaccine.  She had her initial Covid vaccines but not yet her booster.   Past Medical/Surgical History: Past Medical History:  Diagnosis Date  . Anemia   . Heart palpitations   . Hypertension   . MS (multiple sclerosis) ([place])   . Pregnancy induced hypertension     Past Surgical History:  Procedure Laterality Date  . NO PAST SURGERIES      Social History:  reports that she has never smoked. She has never used smokeless tobacco. She reports that she does not drink alcohol and does not use drugs.  Allergies: No Known Allergies  Family History:  Family History  Problem Relation Age of Onset  . Hypertension Other   . Hypertension Maternal Grandmother   . Hypertension Paternal Grandmother   . Diabetes Father   . High blood pressure Father      Current Outpatient Medications:  .  Siponimod Fumarate (MAYZENT) 2 MG TABS, Take 1 tablet by mouth daily. Mayzent, Disp: , Rfl:  .  Vitamin D, Ergocalciferol, (DRISDOL) 1.25 MG (50000 UT) CAPS capsule, Take 1 capsule (50,000 Units total) by mouth every 7 (seven) days., Disp: 13 capsule, Rfl: 4  Review of Systems:  Constitutional:  Denies fever, chills, diaphoresis, appetite change and fatigue.  HEENT: Denies photophobia, eye pain, redness, hearing loss, ear pain, congestion, sore throat, rhinorrhea, sneezing, mouth sores, trouble swallowing, neck pain, neck stiffness and tinnitus.   Respiratory: Denies SOB, DOE, cough, chest tightness,  and wheezing.   Cardiovascular: Denies chest pain, palpitations and leg swelling.  Gastrointestinal: Denies nausea, vomiting, abdominal pain, diarrhea, constipation, blood in stool and abdominal distention.  Genitourinary: Denies dysuria, urgency, frequency, hematuria, flank pain and difficulty urinating.  Endocrine: Denies: hot or cold intolerance, sweats, changes in hair or nails, polyuria, polydipsia. Musculoskeletal: Denies myalgias, back pain, joint swelling, arthralgias and gait problem.  Skin: Denies pallor, rash and wound.  Neurological: Denies dizziness, seizures, syncope, weakness, light-headedness, numbness and headaches.  Hematological: Denies adenopathy. Easy bruising, personal or family bleeding history  Psychiatric/Behavioral: Denies suicidal ideation, mood changes, confusion, nervousness, sleep disturbance and agitation    Physical Exam: Vitals:   12/24/19 1424  BP: 130/74  Pulse: 83  Temp: 98.3 F (36.8 C)  TempSrc: Oral  SpO2: 98%  Weight: (!) 339 lb 14.4 oz (154.2 kg)  Height: _0  (1.702 m)    Body mass index is 53.24 kg/m.   Constitutional: NAD, calm, comfortable, obese Eyes: PERRL, lids and conjunctivae normal ENMT: Mucous membranes are moist. Posterior pharynx clear of any exudate or lesions. Normal dentition. Tympanic membrane is pearly white, no erythema or bulging. Neck: normal, supple, no masses, no thyromegaly Respiratory: clear to auscultation  bilaterally, no wheezing, no crackles. Normal respiratory effort. No accessory muscle use.  Cardiovascular: Regular rate and rhythm, no murmurs / rubs / gallops. No extremity edema. 2+ pedal pulses.   Abdomen: no tenderness, no masses palpated. No hepatosplenomegaly. Bowel sounds positive.  Musculoskeletal: no clubbing / cyanosis. No joint deformity upper and lower extremities. Good ROM, no contractures. Normal muscle tone.  Skin: no rashes, lesions, ulcers. No induration Neurologic: CN 2-12 grossly intact. Sensation intact, DTR normal. Strength 5/5 in all 4.  Psychiatric: Normal judgment and insight. Alert and oriented x 3. Normal mood.    Impression and Plan:  Encounter for preventive health examination  -She has routine eye and dental care. -Flu vaccine today, otherwise immunizations are up-to-date with the exception of her Covid booster which she will be due for next month. -She will return fasting for labs. -Healthy lifestyle discussed in detail. -She had a Pap smear in March that was negative, repeat in 2024. -Commence routine breast cancer screening age 62 and colon cancer screening age 89.  Morbid obesity ([place]) -Discussed healthy lifestyle, including increased physical activity and better food choices to promote weight loss. -She has been congratulated on her weight loss thus far.  Vitamin D deficiency  - Plan: VITAMIN D 25 Hydroxy (Vit-D Deficiency, Fractures)  OSA (obstructive sleep apnea) -On nightly CPAP.  Multiple sclerosis ([place]) -On siponimod followed by neurology.  Need for influenza vaccination -Flu vaccine administered today.   Patient Instructions  -Nice seeing you today!!  -Return fasting for labs.  -Flu vaccine today.  -Schedule follow up in 1 year or sooner as needed.   Preventive Care 56-62 Years Old, Female Preventive care refers to visits with your health care provider and lifestyle choices that can promote health and wellness. This includes:  A yearly physical exam. This may also be called an annual well check.  Regular dental visits and eye exams.  Immunizations.  Screening for certain conditions.  Healthy lifestyle choices, such  as eating a healthy diet, getting regular exercise, not using drugs or products that contain nicotine and tobacco, and limiting alcohol use. What can I expect for my preventive care visit? Physical exam Your health care provider will check your:  Height and weight. This may be used to calculate body mass index (BMI), which tells if you are at a healthy weight.  Heart rate and blood pressure.  Skin for abnormal spots. Counseling Your health care provider may ask you questions about your:  Alcohol, tobacco, and drug use.  Emotional well-being.  Home and relationship well-being.  Sexual activity.  Eating habits.  Work and work Statistician.  Method of birth control.  Menstrual cycle.  Pregnancy history. What immunizations do I need?  Influenza (flu) vaccine  This is recommended every year. Tetanus, diphtheria, and pertussis (Tdap) vaccine  You may need a Td booster every 10 years. Varicella (chickenpox) vaccine  You may need this if you have not been vaccinated. Human papillomavirus (HPV) vaccine  If recommended by your health care provider, you may need three doses over 6 months. Measles, mumps, and rubella (MMR) vaccine  You may need at least one dose of MMR. You may also need a second dose. Meningococcal conjugate (MenACWY) vaccine  One dose is recommended if you are age 15-21 years and a first-year college student living in a residence hall, or if you have one of several medical conditions. You may also need additional booster doses. Pneumococcal conjugate (PCV13) vaccine  You may need this if you have  certain conditions and were not previously vaccinated. Pneumococcal polysaccharide (PPSV23) vaccine  You may need one or two doses if you smoke cigarettes or if you have certain conditions. Hepatitis A vaccine  You may need this if you have certain conditions or if you travel or work in places where you may be exposed to hepatitis A. Hepatitis B vaccine  You  may need this if you have certain conditions or if you travel or work in places where you may be exposed to hepatitis B. Haemophilus influenzae type b (Hib) vaccine  You may need this if you have certain conditions. You may receive vaccines as individual doses or as more than one vaccine together in one shot (combination vaccines). Talk with your health care provider about the risks and benefits of combination vaccines. What tests do I need?  Blood tests  Lipid and cholesterol levels. These may be checked every 5 years starting at age 23.  Hepatitis C test.  Hepatitis B test. Screening  Diabetes screening. This is done by checking your blood sugar (glucose) after you have not eaten for a while (fasting).  Sexually transmitted disease (STD) testing.  BRCA-related cancer screening. This may be done if you have a family history of breast, ovarian, tubal, or peritoneal cancers.  Pelvic exam and Pap test. This may be done every 3 years starting at age 23. Starting at age 66, this may be done every 5 years if you have a Pap test in combination with an HPV test. Talk with your health care provider about your test results, treatment options, and if necessary, the need for more tests. Follow these instructions at home: Eating and drinking   Eat a diet that includes fresh fruits and vegetables, whole grains, lean protein, and low-fat dairy.  Take vitamin and mineral supplements as recommended by your health care provider.  Do not drink alcohol if: ? Your health care provider tells you not to drink. ? You are pregnant, may be pregnant, or are planning to become pregnant.  If you drink alcohol: ? Limit how much you have to 0-1 drink a day. ? Be aware of how much alcohol is in your drink. In the [place] one drink equals one 12 oz bottle of beer (355 mL), one 5 oz glass of wine (148 mL), or one 1 oz glass of hard liquor (44 mL). Lifestyle  Take daily care of your teeth and gums.  Stay  active. Exercise for at least 30 minutes on 5 or more days each week.  Do not use any products that contain nicotine or tobacco, such as cigarettes, e-cigarettes, and chewing tobacco. If you need help quitting, ask your health care provider.  If you are sexually active, practice safe sex. Use a condom or other form of birth control (contraception) in order to prevent pregnancy and STIs (sexually transmitted infections). If you plan to become pregnant, see your health care provider for a preconception visit. What's next?  Visit your health care provider once a year for a well check visit.  Ask your health care provider how often you should have your eyes and teeth checked.  Stay up to date on all vaccines. This information is not intended to replace advice given to you by your health care provider. Make sure you discuss any questions you have with your health care provider. Document Revised: 10/03/2017 Document Reviewed: 10/03/2017 Elsevier Patient Education  2020 [place], MD [facility]

## 2019-12-24 NOTE — Patient Instructions (Signed)
-Nice seeing you today!!  -Return fasting for labs.  -Flu vaccine today.  -Schedule follow up in 1 year or sooner as needed.   Preventive Care 23-36 Years Old, Female Preventive care refers to visits with your health care provider and lifestyle choices that can promote health and wellness. This includes:  A yearly physical exam. This may also be called an annual well check.  Regular dental visits and eye exams.  Immunizations.  Screening for certain conditions.  Healthy lifestyle choices, such as eating a healthy diet, getting regular exercise, not using drugs or products that contain nicotine and tobacco, and limiting alcohol use. What can I expect for my preventive care visit? Physical exam Your health care provider will check your:  Height and weight. This may be used to calculate body mass index (BMI), which tells if you are at a healthy weight.  Heart rate and blood pressure.  Skin for abnormal spots. Counseling Your health care provider may ask you questions about your:  Alcohol, tobacco, and drug use.  Emotional well-being.  Home and relationship well-being.  Sexual activity.  Eating habits.  Work and work Statistician.  Method of birth control.  Menstrual cycle.  Pregnancy history. What immunizations do I need?  Influenza (flu) vaccine  This is recommended every year. Tetanus, diphtheria, and pertussis (Tdap) vaccine  You may need a Td booster every 10 years. Varicella (chickenpox) vaccine  You may need this if you have not been vaccinated. Human papillomavirus (HPV) vaccine  If recommended by your health care provider, you may need three doses over 6 months. Measles, mumps, and rubella (MMR) vaccine  You may need at least one dose of MMR. You may also need a second dose. Meningococcal conjugate (MenACWY) vaccine  One dose is recommended if you are age 47-21 years and a first-year college student living in a residence hall, or if you have  one of several medical conditions. You may also need additional booster doses. Pneumococcal conjugate (PCV13) vaccine  You may need this if you have certain conditions and were not previously vaccinated. Pneumococcal polysaccharide (PPSV23) vaccine  You may need one or two doses if you smoke cigarettes or if you have certain conditions. Hepatitis A vaccine  You may need this if you have certain conditions or if you travel or work in places where you may be exposed to hepatitis A. Hepatitis B vaccine  You may need this if you have certain conditions or if you travel or work in places where you may be exposed to hepatitis B. Haemophilus influenzae type b (Hib) vaccine  You may need this if you have certain conditions. You may receive vaccines as individual doses or as more than one vaccine together in one shot (combination vaccines). Talk with your health care provider about the risks and benefits of combination vaccines. What tests do I need?  Blood tests  Lipid and cholesterol levels. These may be checked every 5 years starting at age 73.  Hepatitis C test.  Hepatitis B test. Screening  Diabetes screening. This is done by checking your blood sugar (glucose) after you have not eaten for a while (fasting).  Sexually transmitted disease (STD) testing.  BRCA-related cancer screening. This may be done if you have a family history of breast, ovarian, tubal, or peritoneal cancers.  Pelvic exam and Pap test. This may be done every 3 years starting at age 11. Starting at age 58, this may be done every 5 years if you have a Pap test  in combination with an HPV test. Talk with your health care provider about your test results, treatment options, and if necessary, the need for more tests. Follow these instructions at home: Eating and drinking   Eat a diet that includes fresh fruits and vegetables, whole grains, lean protein, and low-fat dairy.  Take vitamin and mineral supplements as  recommended by your health care provider.  Do not drink alcohol if: ? Your health care provider tells you not to drink. ? You are pregnant, may be pregnant, or are planning to become pregnant.  If you drink alcohol: ? Limit how much you have to 0-1 drink a day. ? Be aware of how much alcohol is in your drink. In the [place] one drink equals one 12 oz bottle of beer (355 mL), one 5 oz glass of wine (148 mL), or one 1 oz glass of hard liquor (44 mL). Lifestyle  Take daily care of your teeth and gums.  Stay active. Exercise for at least 30 minutes on 5 or more days each week.  Do not use any products that contain nicotine or tobacco, such as cigarettes, e-cigarettes, and chewing tobacco. If you need help quitting, ask your health care provider.  If you are sexually active, practice safe sex. Use a condom or other form of birth control (contraception) in order to prevent pregnancy and STIs (sexually transmitted infections). If you plan to become pregnant, see your health care provider for a preconception visit. What's next?  Visit your health care provider once a year for a well check visit.  Ask your health care provider how often you should have your eyes and teeth checked.  Stay up to date on all vaccines. This information is not intended to replace advice given to you by your health care provider. Make sure you discuss any questions you have with your health care provider. Document Revised: 10/03/2017 Document Reviewed: 10/03/2017 Elsevier Patient Education  2020 [employer].

## 2019-12-30 ENCOUNTER — Other Ambulatory Visit: Payer: BC Managed Care – PPO

## 2019-12-30 ENCOUNTER — Other Ambulatory Visit: Payer: Self-pay

## 2019-12-30 DIAGNOSIS — Z0000 Encounter for general adult medical examination without abnormal findings: Secondary | ICD-10-CM

## 2019-12-30 DIAGNOSIS — E559 Vitamin D deficiency, unspecified: Secondary | ICD-10-CM

## 2019-12-30 NOTE — Addendum Note (Signed)
Addended by: Kathryne Hitch on: 12/30/2019 08:54 AM   Modules accepted: Orders

## 2020-01-04 ENCOUNTER — Other Ambulatory Visit: Payer: Self-pay | Admitting: Internal Medicine

## 2020-01-04 DIAGNOSIS — E559 Vitamin D deficiency, unspecified: Secondary | ICD-10-CM

## 2020-01-04 MED ORDER — VITAMIN D (ERGOCALCIFEROL) 1.25 MG (50000 UNIT) PO CAPS: 50000.0000 [IU] | ORAL_CAPSULE | ORAL | 0 refills | Status: AC

## 2020-01-05 ENCOUNTER — Other Ambulatory Visit: Payer: Self-pay | Admitting: Internal Medicine

## 2020-01-05 DIAGNOSIS — E559 Vitamin D deficiency, unspecified: Secondary | ICD-10-CM

## 2020-01-07 LAB — TEST AUTHORIZATION 2

## 2020-01-07 LAB — COMPREHENSIVE METABOLIC PANEL
AG Ratio: 1.3 (calc) (ref 1.0–2.5)
ALT: 15 U/L (ref 6–29)
AST: 11 U/L (ref 10–30)
Albumin: 3.9 g/dL (ref 3.6–5.1)
Alkaline phosphatase (APISO): 58 U/L (ref 31–125)
BUN: 14 mg/dL (ref 7–25)
CO2: 20 mmol/L (ref 20–32)
Calcium: 8.8 mg/dL (ref 8.6–10.2)
Chloride: 107 mmol/L (ref 98–110)
Creat: 1.06 mg/dL (ref 0.50–1.10)
Globulin: 2.9 g/dL (calc) (ref 1.9–3.7)
Glucose, Bld: 83 mg/dL (ref 65–99)
Potassium: 4.3 mmol/L (ref 3.5–5.3)
Sodium: 141 mmol/L (ref 135–146)
Total Bilirubin: 0.5 mg/dL (ref 0.2–1.2)
Total Protein: 6.8 g/dL (ref 6.1–8.1)

## 2020-01-07 LAB — TSH: TSH: 0.95 mIU/L

## 2020-01-07 LAB — TEST AUTHORIZATION

## 2020-01-07 LAB — LIPID PANEL
Cholesterol: 247 mg/dL — ABNORMAL HIGH (ref <200)
HDL: 50 mg/dL (ref >=50)
LDL Cholesterol (Calc): 172 mg/dL (calc) — ABNORMAL HIGH
Non-HDL Cholesterol (Calc): 197 mg/dL (calc) — ABNORMAL HIGH (ref <130)
Total CHOL/HDL Ratio: 4.9 (calc) (ref <5.0)
Triglycerides: 122 mg/dL (ref <150)

## 2020-01-07 LAB — VITAMIN B12: Vitamin B-12: 426 pg/mL (ref 200–1100)

## 2020-01-07 LAB — VITAMIN D 25 HYDROXY (VIT D DEFICIENCY, FRACTURES): Vit D, 25-Hydroxy: 23 ng/mL — ABNORMAL LOW (ref 30–100)

## 2020-01-08 ENCOUNTER — Telehealth: Payer: Self-pay | Admitting: Internal Medicine

## 2020-01-08 NOTE — Telephone Encounter (Signed)
Pt is calling in wanting to get her lab results she is very concerned about he cholesterol and want to know what to do about it.

## 2020-01-12 ENCOUNTER — Encounter: Payer: Self-pay | Admitting: Internal Medicine

## 2020-01-12 ENCOUNTER — Other Ambulatory Visit: Payer: Self-pay | Admitting: Internal Medicine

## 2020-01-12 DIAGNOSIS — E785 Hyperlipidemia, unspecified: Secondary | ICD-10-CM

## 2020-01-12 DIAGNOSIS — E782 Mixed hyperlipidemia: Secondary | ICD-10-CM | POA: Insufficient documentation

## 2020-01-12 MED ORDER — ATORVASTATIN CALCIUM 40 MG PO TABS: 40.0000 mg | ORAL_TABLET | Freq: Every day | ORAL | 1 refills | Status: DC

## 2020-01-13 ENCOUNTER — Other Ambulatory Visit: Payer: Self-pay | Admitting: Internal Medicine

## 2020-01-13 DIAGNOSIS — E785 Hyperlipidemia, unspecified: Secondary | ICD-10-CM

## 2020-01-13 DIAGNOSIS — E559 Vitamin D deficiency, unspecified: Secondary | ICD-10-CM

## 2020-03-03 ENCOUNTER — Ambulatory Visit (INDEPENDENT_AMBULATORY_CARE_PROVIDER_SITE_OTHER): Payer: BC Managed Care – PPO | Admitting: Neurology

## 2020-03-03 ENCOUNTER — Encounter: Payer: Self-pay | Admitting: Neurology

## 2020-03-03 VITALS — BP 129/81 | HR 79 | Ht 67.0 in | Wt 315.0 lb

## 2020-03-03 DIAGNOSIS — G4733 Obstructive sleep apnea (adult) (pediatric): Secondary | ICD-10-CM

## 2020-03-03 DIAGNOSIS — G35 Multiple sclerosis: Secondary | ICD-10-CM | POA: Diagnosis not present

## 2020-03-03 DIAGNOSIS — R5383 Other fatigue: Secondary | ICD-10-CM | POA: Diagnosis not present

## 2020-03-03 DIAGNOSIS — Z79899 Other long term (current) drug therapy: Secondary | ICD-10-CM | POA: Diagnosis not present

## 2020-03-03 DIAGNOSIS — G253 Myoclonus: Secondary | ICD-10-CM

## 2020-03-03 DIAGNOSIS — H469 Unspecified optic neuritis: Secondary | ICD-10-CM | POA: Diagnosis not present

## 2020-03-03 DIAGNOSIS — R41840 Attention and concentration deficit: Secondary | ICD-10-CM

## 2020-03-03 DIAGNOSIS — G4719 Other hypersomnia: Secondary | ICD-10-CM | POA: Insufficient documentation

## 2020-03-03 MED ORDER — ARMODAFINIL 200 MG PO TABS: ORAL_TABLET | ORAL | 5 refills | Status: DC

## 2020-03-03 NOTE — Progress Notes (Signed)
[facility]  PATIENT: Amanda Wise DOB: [birth year]  REFERRING DOCTOR OR PCP: Ritta Slot SOURCE: Patient, notes from emergency room and hospital admission, laboratory reports, imaging reports, MRI images personally reviewed.  _________________________________   HISTORICAL  CHIEF COMPLAINT:  Chief Complaint  Patient presents with  . Follow-up    RM 13. Last seen 07/16/2019. On Mayzent for MS.  "memory and focus not as good as it used to be; physically I am fine"    HISTORY OF PRESENT ILLNESS:  Amanda Wise is a 35 y.o. woman who was diagnosed with multiple sclerosis in December 2019.  Update 03/03/2020: She has been on siponimod and she tolerates it well. No exacerbation since her last visit though sh may had a visual one last summer.     MRI brain in April 2021 showed no new MS lesions.  The visual changes she had at the last visit have improved.   Colors are no longer desaturated OD.      Strength, sensation and gait are all stable.   She notes balance is very mildly reduced (holds bannister going downstairs.  Right facial myokymia resolved.    Vision as above.   Bladder is doing well.    Fatigue is still a problem.   She has reduced sleep (goes to bed at 1230 am and needs to wake up at 630 am for her kids on weekdays and she works longer hours on weekends.     She also mild cognitive issues - forgetful, reduced focus/attention and some word finding issues..   Mood is fine now.    She has OSA, (mild overall but severe during REM).  Due to the expense she opted not to go on CPAP.  EPWORTH SLEEPINESS SCALE  On a scale of 0 - 3 what is the chance of dozing:  Sitting and Reading:   1 Watching TV:    1 Sitting inactive in a public place: 0 Passenger in car for one hour: 3 Lying down to rest in the afternoon: 3 Sitting and talking to someone: 0 Sitting quietly after lunch:  3 In a car, stopped in traffic:  1  Total (out of 24):   12/24    Mile EDS    MS HISTORY: She had an episode of bilateral leg weakness in 2012, she improved over 4-5 months.  She felt symptoms were due to working out.  In 2016, she had the onset of poor balance x 3-6 months that completely resolved.    She never sought out medical/neurologic care.  In August 2019, she had some weakness and numbness in her legs for a couple weeks but then improved after she saw a Land.    In December 2019,  she had the onset of tingling in both hands and right hand weakness.   She went to the ED.  She had MRI of the brain and MRI of the cervical spine and they were consistent with MS.  She was admitted and received 5 days of IV Solu-Medrol.   She felt better by her discharge, closer to her baseline.   She went on Tysabri but due to cost switched to a siponimod drug study in 2022.   She has been on siponimod/Mayzent since January 2021     IMAGING: MRI of the brain and cervical spine dated 01/30/2018.  The brain shows multiple T2/flair hyperintense foci in the hemispheres in the periventricular, juxtacortical and deep white matter.  There are 2 foci peripherally in the  pons and another focus in the left middle cerebellar peduncle.  The one pontine focus enhances.  2 small foci in the hemispheres enhance.  The MRI of the cervical spine shows several foci in the spinal cord including 1 at C3 to the left and 1 at C6-C7 on the right that enhance.    The other foci located at C2-C3 posteriorly and C3 posteriorly to the right.  MRI of the brain 05/23/2019 showed multiple T2/FLAIR hyperintense foci in the brainstem, cerebellum and cerebral hemispheres in a pattern and configuration consistent with chronic demyelinating plaque associated with multiple sclerosis. None of the foci appear to be acute. Compared to the MRI from 01/30/2018, there are no new lesions. Several foci that were enhancing in 2019 no longer do so.   Other studies: Polysomnography 04/23/2018 showed mild overall AHI  (10.6) that was severe during REM sleep (REM AHI equals 37.9).  There is no significant restless leg syndrome.   REVIEW OF SYSTEMS: Constitutional: No fevers, chills, sweats, or change in appetite Eyes: No visual changes, double vision, eye pain Ear, nose and throat: No hearing loss, ear pain, nasal congestion, sore throat Cardiovascular: No chest pain, palpitations Respiratory: No shortness of breath at rest or with exertion.   No wheezes GastrointestinaI: No nausea, vomiting, diarrhea, abdominal pain, fecal incontinence Genitourinary: No dysuria, urinary retention or frequency.  No nocturia. Musculoskeletal: No neck pain, back pain Integumentary: No rash, pruritus, skin lesions Neurological: as above Psychiatric: No depression at this time.  No anxiety Endocrine: No palpitations, diaphoresis, change in appetite, change in weigh or increased thirst Hematologic/Lymphatic: No anemia, purpura, petechiae. Allergic/Immunologic: No itchy/runny eyes, nasal congestion, recent allergic reactions, rashes  ALLERGIES: No Known Allergies  HOME MEDICATIONS:  Current Outpatient Medications:  .  atorvastatin (LIPITOR) 40 MG tablet, Take 1 tablet (40 mg total) by mouth daily., Disp: 90 tablet, Rfl: 1 .  Siponimod Fumarate (MAYZENT) 2 MG TABS, Take 1 tablet by mouth daily. Mayzent, Disp: , Rfl:  .  Vitamin D, Ergocalciferol, (DRISDOL) 1.25 MG (50000 UNIT) CAPS capsule, Take 1 capsule (50,000 Units total) by mouth every 7 (seven) days for 12 doses., Disp: 12 capsule, Rfl: 0  PAST MEDICAL HISTORY: Past Medical History:  Diagnosis Date  . Anemia   . Heart palpitations   . Hypertension   . MS (multiple sclerosis) (HCC)   . Pregnancy induced hypertension     PAST SURGICAL HISTORY: Past Surgical History:  Procedure Laterality Date  . NO PAST SURGERIES      FAMILY HISTORY: Family History  Problem Relation Age of Onset  . Hypertension Other   . Hypertension Maternal Grandmother   .  Hypertension Paternal Grandmother   . Diabetes Father   . High blood pressure Father     SOCIAL HISTORY:  Social History   Socioeconomic History  . Marital status: Single    Spouse name: Not on file  . Number of children: 2  . Years of education: 28  . Highest education level: Not on file  Occupational History  . Occupation: Orthoptist  Tobacco Use  . Smoking status: Never Smoker  . Smokeless tobacco: Never Used  Substance and Sexual Activity  . Alcohol use: No  . Drug use: No  . Sexual activity: Yes    Birth control/protection: None  Other Topics Concern  . Not on file  Social History Narrative   Lives with family   Caffeine use: soda sometimes   Right handed    Social Determinants of Health  Financial Resource Strain: Not on file  Food Insecurity: Not on file  Transportation Needs: Not on file  Physical Activity: Not on file  Stress: Not on file  Social Connections: Not on file  Intimate Partner Violence: Not on file     PHYSICAL EXAM  Vitals:   03/03/20 1509  BP: 129/81  Pulse: 79  Weight: (!) 315 lb (142.9 kg)  Height: 5\' 7"  (1.702 m)    Body mass index is 49.34 kg/m.  No exam data present   General: The patient is well-developed and well-nourished and in no acute distress.  Abdomen is nontender with normal sounds.  Eyes:  Funduscopic exam shows normal optic discs and retinal vessels.  Neck: No thyromegaly.  The neck is supple, no carotid bruits are noted.  The neck is nontender.  Cardiovascular: The heart has a regular rate and rhythm with a normal S1 and S2. There were no murmurs, gallops or rubs. Lungs are clear to auscultation.  Skin: Extremities are without rash or edema.  Musculoskeletal: Joints are nontender.  Back is nontender  Neurologic Exam  Mental status: The patient is alert and oriented x 3 at the time of the examination. The patient has apparent normal recent and remote memory, with an apparently normal  attention span and concentration ability.   Speech is normal.  Cranial nerves: Extraocular movements are full.  She has a mild right APD.  Facial strength was normal.  She had visible myokymia at the corner of the mouth and above the eye on the right..  Trapezius and sternocleidomastoid strength is normal. No dysarthria is noted.  Hearing appears normal and symmetric  Motor:  Muscle bulk is normal.   Tone is normal. Strength is  5 / 5 in all 4 extremities.   Sensory: She has intact sensation to touch and vibration in the arms and legs. Coordination: Cerebellar testing reveals good finger-nose-finger and heel-to-shin bilaterally.  Gait and station: Station is normal.   Gait is normal.  Tandem gait is mildly wide.  Romberg is negative.  Reflexes: Deep tendon reflexes are symmetric and normal in arms and increased in legs without clonus.       DIAGNOSTIC DATA (LABS, IMAGING, TESTING) - I reviewed patient records, labs, notes, testing and imaging myself where available.  Lab Results  Component Value Date   WBC 4.1 12/01/2019   HGB 13.2 12/01/2019   HCT 38.3 12/01/2019   MCV 88.2 12/01/2019   PLT 271 12/01/2019      Component Value Date/Time   NA 141 12/30/2019 0856   K 4.3 12/30/2019 0856   CL 107 12/30/2019 0856   CO2 20 12/30/2019 0856   GLUCOSE 83 12/30/2019 0856   BUN 14 12/30/2019 0856   CREATININE 1.06 12/30/2019 0856   CALCIUM 8.8 12/30/2019 0856   PROT 6.8 12/30/2019 0856   ALBUMIN 3.7 07/17/2018 1012   AST 11 12/30/2019 0856   ALT 15 12/30/2019 0856   ALKPHOS 62 07/17/2018 1012   BILITOT 0.5 12/30/2019 0856   GFRNONAA >60 12/01/2019 0311   GFRAA >60 12/22/2018 0240     ____________________________________________________________   1. Multiple sclerosis (HCC)   2. High risk medication use   3. Other fatigue   4. Optic neuritis   5. Myokymia   6. Attention deficit   7. OSA (obstructive sleep apnea)   8. Excessive daytime sleepiness     1.    Continue  Mayzent.   MRI of the brain and labwork later this year.  2.    Stay active and exercise as tolerated. 3.    Nuvigil for EDS (has OSA) and ADD .  If not better, try a stimulant.  We also discussed reconsideration of CPAP. 4.    rtc 6 months or sooner if new or worsening neurologic issues.  Steel Kerney A. Epimenio Foot, MD, [facility] 03/03/2020, 3:58 PM Certified in Neurology, Clinical Neurophysiology, Sleep Medicine, Pain Medicine and Neuroimaging  [facility] [address], [address] [place], [place] [address] [phone]

## 2020-04-06 DIAGNOSIS — Z20822 Contact with and (suspected) exposure to covid-19: Secondary | ICD-10-CM | POA: Diagnosis not present

## 2020-04-08 ENCOUNTER — Telehealth: Payer: BC Managed Care – PPO | Admitting: Internal Medicine

## 2020-05-03 ENCOUNTER — Emergency Department (HOSPITAL_BASED_OUTPATIENT_CLINIC_OR_DEPARTMENT_OTHER): Payer: BC Managed Care – PPO | Admitting: Radiology

## 2020-05-03 ENCOUNTER — Other Ambulatory Visit: Payer: Self-pay

## 2020-05-03 ENCOUNTER — Encounter (HOSPITAL_BASED_OUTPATIENT_CLINIC_OR_DEPARTMENT_OTHER): Payer: Self-pay

## 2020-05-03 ENCOUNTER — Emergency Department (HOSPITAL_BASED_OUTPATIENT_CLINIC_OR_DEPARTMENT_OTHER)
Admission: EM | Admit: 2020-05-03 | Discharge: 2020-05-04 | Disposition: A | Discharge status: Home / Self Care | Payer: BC Managed Care – PPO | Attending: Emergency Medicine | Admitting: Emergency Medicine

## 2020-05-03 DIAGNOSIS — R0789 Other chest pain: Secondary | ICD-10-CM | POA: Diagnosis not present

## 2020-05-03 DIAGNOSIS — I10 Essential (primary) hypertension: Secondary | ICD-10-CM | POA: Diagnosis not present

## 2020-05-03 DIAGNOSIS — R079 Chest pain, unspecified: Secondary | ICD-10-CM | POA: Diagnosis not present

## 2020-05-03 LAB — BASIC METABOLIC PANEL
Anion gap: 10 (ref 5–15)
BUN: 13 mg/dL (ref 6–20)
CO2: 24 mmol/L (ref 22–32)
Calcium: 8.9 mg/dL (ref 8.9–10.3)
Chloride: 103 mmol/L (ref 98–111)
Creatinine, Ser: 1.01 mg/dL — ABNORMAL HIGH (ref 0.44–1.00)
GFR, Estimated: >60 mL/min (ref >60)
Glucose, Bld: 85 mg/dL (ref 70–99)
Potassium: 3.7 mmol/L (ref 3.5–5.1)
Sodium: 137 mmol/L (ref 135–145)

## 2020-05-03 LAB — CBC
HCT: 37.6 % (ref 36.0–46.0)
Hemoglobin: 12.8 g/dL (ref 12.0–15.0)
MCH: 29.8 pg (ref 26.0–34.0)
MCHC: 34 g/dL (ref 30.0–36.0)
MCV: 87.4 fL (ref 80.0–100.0)
Platelets: 248 10*3/uL (ref 150–400)
RBC: 4.3 MIL/uL (ref 3.87–5.11)
RDW: 13.4 % (ref 11.5–15.5)
WBC: 4.5 10*3/uL (ref 4.0–10.5)
nRBC: 0 % (ref 0.0–0.2)

## 2020-05-03 LAB — PREGNANCY, URINE: Preg Test, Ur: NEGATIVE

## 2020-05-03 LAB — TROPONIN I (HIGH SENSITIVITY): Troponin I (High Sensitivity): 6 ng/L (ref <18)

## 2020-05-03 NOTE — ED Provider Notes (Signed)
[facility] EMERGENCY DEPT Provider Note   CSN: [id number] Arrival date & time: 05/03/20  2034     History Chief Complaint  Patient presents with  . Chest Pain    Amanda Wise is a 35 y.o. female.  The history is provided by the patient.  Chest Pain Pain location:  R chest Pain quality comment:  "like a pulled muscle" Pain radiates to:  R jaw Pain severity:  Mild Onset quality:  Sudden Duration:  2 hours Timing:  Constant Progression:  Unchanged Chronicity:  New Context: at rest   Relieved by:  Nothing Worsened by:  Nothing Ineffective treatments:  None tried Associated symptoms: no abdominal pain, no back pain, no cough, no diaphoresis, no fever, no lower extremity edema, no nausea, no numbness, no palpitations, no shortness of breath and no vomiting   Risk factors: high cholesterol and obesity        Past Medical History:  Diagnosis Date  . Anemia   . Heart palpitations   . Hypertension   . MS (multiple sclerosis) (HCC)   . Pregnancy induced hypertension     Patient Active Problem List   Diagnosis Date Noted  . Excessive daytime sleepiness 03/03/2020  . Hyperlipidemia 01/12/2020  . Optic neuritis 07/16/2019  . 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 Attention deficit 10/02/2018  . Other fatigue 10/02/2018  . Morbid obesity (HCC) 07/17/2018  . High risk medication use 02/19/2018  . OSA (obstructive sleep apnea) 02/19/2018  . Vitamin D deficiency 02/19/2018  . Urinary urgency 02/19/2018  . Numbness   . Multiple sclerosis (HCC) 01/30/2018  . Labor and delivery indication for care or intervention 08/07/2016  . Spontaneous vaginal delivery 08/07/2016  . Positive GBS test 03/06/2015  . Vaginal delivery 03/06/2015  . Antepartum non-reassuring fetal heart rate or rhythm affecting care of mother 03/04/2015  . Status post fall 03/03/2015  . Abnormal antenatal test 03/03/2015    Past Surgical  History:  Procedure Laterality Date  . NO PAST SURGERIES       OB History    Gravida  2   Para  2   Term  2   Preterm      AB      Living  2     SAB      IAB      Ectopic      Multiple  0   Live Births  2           Family History  Problem Relation Age of Onset  . Hypertension Other   . Hypertension Maternal Grandmother   . Hypertension Paternal Grandmother   . Diabetes Father   . High blood pressure Father     Social History   Tobacco Use  . Smoking status: Never Smoker  . Smokeless tobacco: Never Used  Substance Use Topics  . Alcohol use: No  . Drug use: No    Home Medications Prior to Admission medications   Medication Sig Start Date End Date Taking? Authorizing Provider  Armodafinil 200 MG TABS One po qAM 03/03/20   Sater, Pearletha Furl, MD  atorvastatin (LIPITOR) 40 MG tablet Take 1 tablet (40 mg total) by mouth daily. 01/12/20   Philip Aspen, Limmie Patricia, MD  Siponimod Fumarate (MAYZENT) 2 MG TABS Take 1 tablet by mouth daily. Mayzent    [provider]    Allergies    Patient has no known allergies.  Review of Systems  Review of Systems  Constitutional: Negative for chills, diaphoresis and fever.  HENT: Negative for ear pain and sore throat.   Eyes: Negative for pain and visual disturbance.  Respiratory: Negative for cough and shortness of breath.   Cardiovascular: Positive for chest pain. Negative for palpitations.  Gastrointestinal: Negative for abdominal pain, nausea and vomiting.  Genitourinary: Negative for dysuria and hematuria.  Musculoskeletal: Negative for arthralgias and back pain.  Skin: Negative for color change and rash.  Neurological: Negative for seizures, syncope and numbness.  All other systems reviewed and are negative.   Physical Exam Updated Vital Signs BP 125/82 (BP Location: Right Arm)   Pulse 73   Temp 98 F (36.7 C) (Oral)   Resp 16   Ht 5\' 7"  (1.702 m)   Wt (!) 139.3 kg   LMP 04/07/2020 (Within  Days)   SpO2 100%   BMI 48.08 kg/m   Physical Exam Vitals and nursing note reviewed.  Constitutional:      General: She is not in acute distress.    Appearance: She is well-developed.  HENT:     Head: Normocephalic and atraumatic.  Eyes:     Conjunctiva/sclera: Conjunctivae normal.  Cardiovascular:     Rate and Rhythm: Normal rate and regular rhythm.     Heart sounds: No murmur heard.   Pulmonary:     Effort: Pulmonary effort is normal. No respiratory distress.     Breath sounds: Normal breath sounds.  Abdominal:     Palpations: Abdomen is soft.     Tenderness: There is no abdominal tenderness.  Musculoskeletal:     Cervical back: Neck supple.  Skin:    General: Skin is warm and dry.  Neurological:     General: No focal deficit present.     Mental Status: She is alert.  Psychiatric:        Mood and Affect: Mood normal.     ED Results / Procedures / Treatments   Labs (all labs ordered are listed, but only abnormal results are displayed) Labs Reviewed  BASIC METABOLIC PANEL - Abnormal; Notable for the following components:      Result Value   Creatinine, Ser 1.01 (*)    All other components within normal limits  CBC  PREGNANCY, URINE  TROPONIN I (HIGH SENSITIVITY)  TROPONIN I (HIGH SENSITIVITY)    EKG None  Radiology DG Chest 2 View  Result Date: 05/03/2020 CLINICAL DATA:  Chest pain EXAM: CHEST - 2 VIEW COMPARISON:  12/22/2018 FINDINGS: The heart size and mediastinal contours are within normal limits. Both lungs are clear. The visualized skeletal structures are unremarkable. IMPRESSION: No active cardiopulmonary disease. Electronically Signed   By: 12/24/2018 M.D.   On: 05/03/2020 21:16    Procedures Procedures   Medications Ordered in ED Medications - No data to display  ED Course  I have reviewed the triage vital signs and the nursing notes.  Pertinent labs & imaging results that were available during my care of the patient were reviewed by me  and considered in my medical decision making (see chart for details).    MDM Rules/Calculators/A&P                          05/05/2020 presents with chest pain. It is somewhat atypical in nature. Nonetheless, she was fully evaluated for ACS. Pain not pleuritic. PERC negative.  Final Clinical Impression(s) / ED Diagnoses Final diagnoses:  Chest pain, unspecified type  Rx / DC Orders ED Discharge Orders    None       Koleen Distance, MD 05/03/20 2328

## 2020-05-03 NOTE — ED Triage Notes (Signed)
Pt here with CP began 2 Hrs PTA.  No SOB, No N/V. No Dizziness.  No Medications PTA. Ambulatory, [facility] 15

## 2020-05-03 NOTE — ED Notes (Signed)
Patient provided with Cranberry Juice as requested.

## 2020-05-04 LAB — TROPONIN I (HIGH SENSITIVITY): Troponin I (High Sensitivity): <2 ng/L (ref <18)

## 2020-05-04 NOTE — ED Provider Notes (Signed)
Care assumed from Dr. Delford Field at shift change.  Patient awaiting results of a second troponin.  Patient presents here with tightness in her chest that started approximately 6 PM.  She describes a tightness with no radiation, nausea, diaphoresis, shortness of breath.  She seems to be feeling better now.  Patient's second troponin has returned and is negative.  Reviewing her laboratory and imaging studies shows no acute abnormality.  Her EKG is normal and chest x-ray is clear.  At this point, discharge seems appropriate.  Patient advised to rest, take ibuprofen as needed, and return if her symptoms worsen.   Geoffery Lyons, MD 05/04/20 [phone]

## 2020-05-04 NOTE — Discharge Instructions (Addendum)
Take ibuprofen 600 mg every 6 hours as needed for pain.  Follow-up with your primary doctor if symptoms or not improving in the next few days, and return to the emergency department if symptoms significantly worsen or change.

## 2020-05-09 ENCOUNTER — Telehealth: Payer: Self-pay | Admitting: Neurology

## 2020-05-09 NOTE — Telephone Encounter (Signed)
LVM for pt to call office

## 2020-05-09 NOTE — Telephone Encounter (Signed)
[facility] called,, we do have Siponimod Fumarate (MAYZENT) 2 MG TABS ready to ship today. Pharmacist want to clarify with the office as far as if there has been a gap in therapy. Record indicated last shipped on February 17 and pharmacist if a first dose observation would be needed once again as far as repeated. Repeat  First dose monitored procedure in patient for home recommended; dosing administration is interrupted for 4 or more consecutive daily doses. So that's why pharmacist wanted [place] to reach out to you. If you could give [place] a call back. [facility] and speak directly to one of the pharmacist on how to proceed. Please contact us at [phone] and provide pharmacist with  Reference no: [id number]. Medication has been place on hold awaiting call form your office.

## 2020-05-09 NOTE — Telephone Encounter (Signed)
Called LVM for pt to call office to further discuss

## 2020-05-10 NOTE — Telephone Encounter (Addendum)
Tried calling Bernita Buffy (partner on [place]) at [phone]. LVM for him to have pt call our office back at [phone]. Tried mother Marise Knapper DPR) at [phone]. LVM for her to have pt call our office back at [phone]  Called [facility] at [phone]. Spoke with pharmacist. Advised pt unreachable at this time. He states they show they shipped medication out yesterday and should arrive by the end of today to pt. Pt called 05/05/20 to advise she did not miss any doses of Mayzent. Nothing further needed.

## 2020-05-18 IMAGING — MR MR CERVICAL SPINE WO/W CM
14 of 22 series · 21 of 48 positions shown · IV contrast (gadavist)
Comparison: None.

CLINICAL DATA: Two days of RIGHT arm numbness now in LEFT hand.
Lower extremity paresthesias for several weeks. Assess stroke versus
demyelination. History of hypertension.

EXAM:
MRI HEAD WITHOUT AND WITH CONTRAST
MRI CERVICAL SPINE WITHOUT AND WITH CONTRAST
TECHNIQUE: Multiplanar, multiecho pulse sequences of the brain and surrounding
structures, and cervical spine, to include the craniocervical
junction and cervicothoracic junction, were obtained without and
with intravenous contrast.
CONTRAST:  10 cc Gadavist

[Series 3: DWI · axial · 3.0mm · 0.94mm/px · z∈[-111,+35]mm · 4 of 100 slices shown (1 of 2)]
[im 1/100]
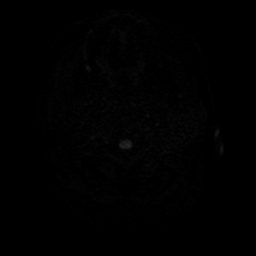
[im 34/100]
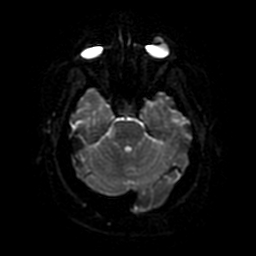
[im 67/100]
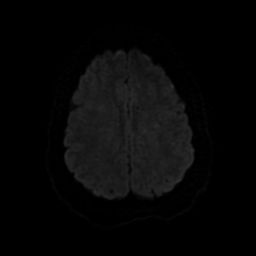
[im 100/100]
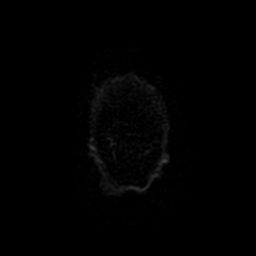

[Series 4: DWI · coronal · 4.0mm · 0.94mm/px · 2 of 70 slices shown (2 of 2)]
[im 1/70]
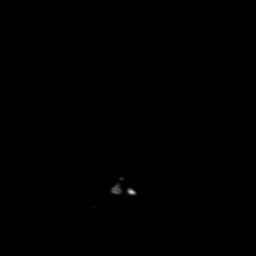
[im 70/70]
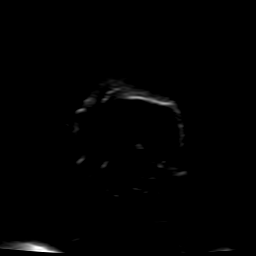

[Series 5: FLAIR · sagittal · 5.0mm · 0.47mm/px · 1 of 23 slices shown (1 of 2)]
[im 1/23]
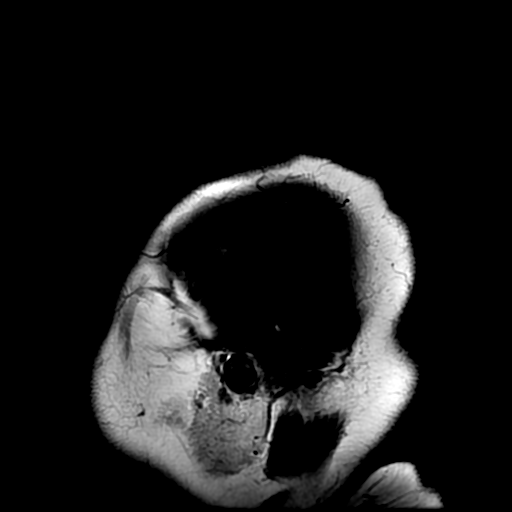

[Series 6: T2 · axial · 5.0mm · 0.47mm/px · 1 of 25 slices shown (1 of 3)]
[im 1/25]
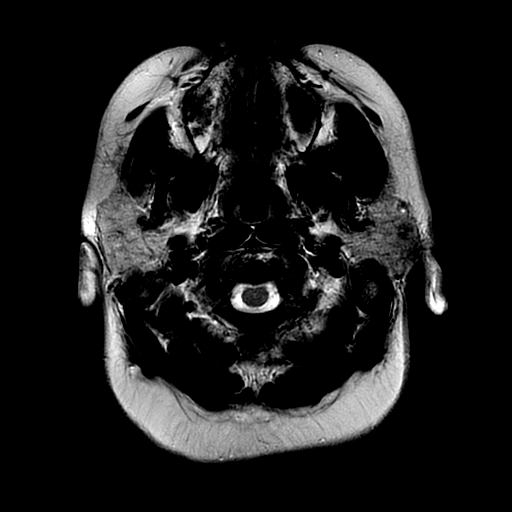

[Series 7: FLAIR · axial · 3.0mm · 0.47mm/px · 1 of 25 slices shown (2 of 2)]
[im 1/25]
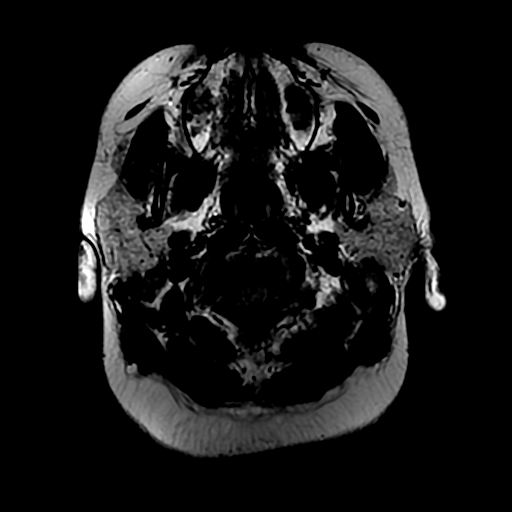

[Series 8: (person_name) · axial · 3.0mm · 0.47mm/px · 1 of 100 slices shown]
[im 1/100]
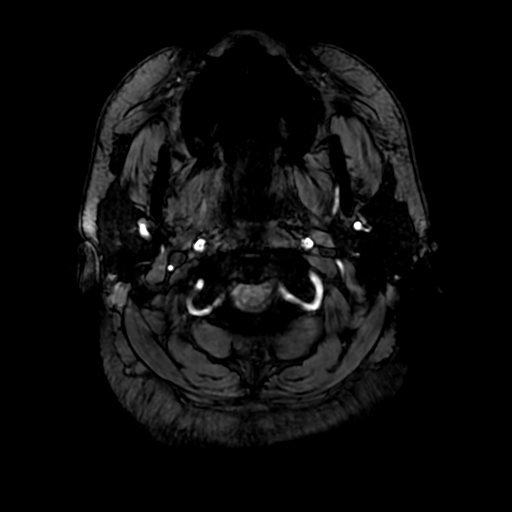

[Series 13: T2 · sagittal · 3.0mm · 0.43mm/px · 1 of 16 slices shown (2 of 3)]
[im 1/16]
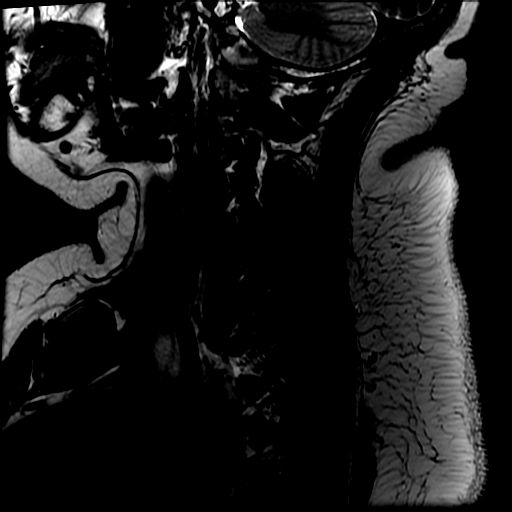

[Series 15: STIR · sagittal · 3.0mm · 0.43mm/px · 1 of 16 slices shown]
[im 1/16]
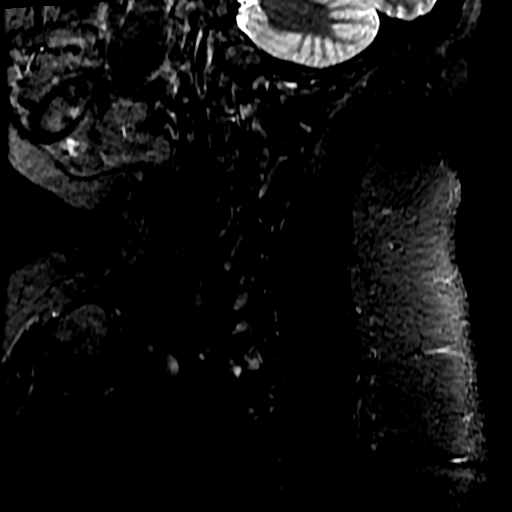

[Series 17: T2 · axial · 3.0mm · 0.35mm/px · z∈[-239,-123]mm · 2 of 36 slices shown (3 of 3)]
[im 1/36]
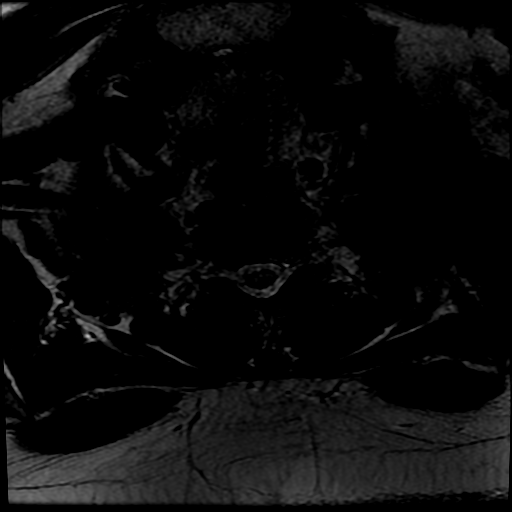
[im 36/36]
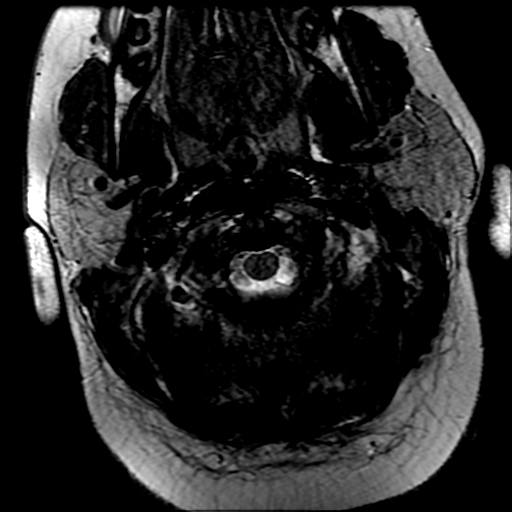

[Series 18: T1 · axial · non-contrast · 3.0mm · 0.35mm/px · z∈[-239,-123]mm · 2 of 36 slices shown (1 of 2)]
[im 1/36]
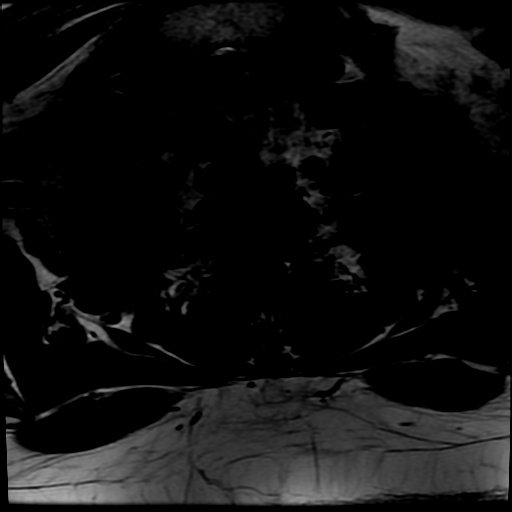
[im 36/36]
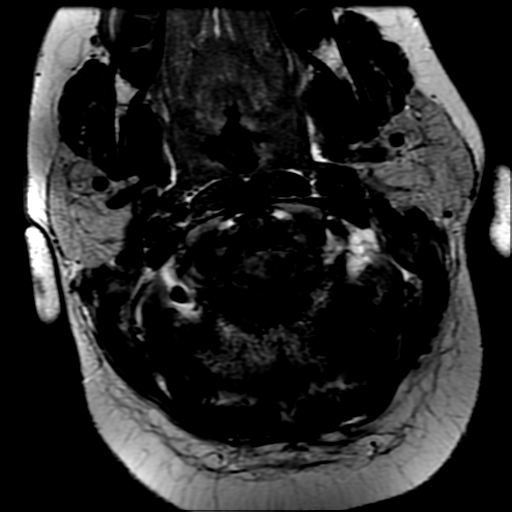

[Series 19: T1 fat-sat post-contrast · sagittal · 3.0mm · 0.43mm/px · 1 of 16 slices shown]
[im 1/16]
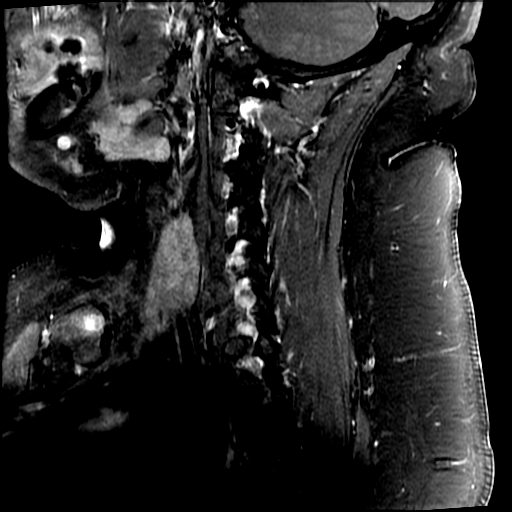

[Series 21: T1 post-contrast · axial · 3.0mm · 0.35mm/px · z∈[-239,-123]mm · 2 of 36 slices shown]
[im 1/36]
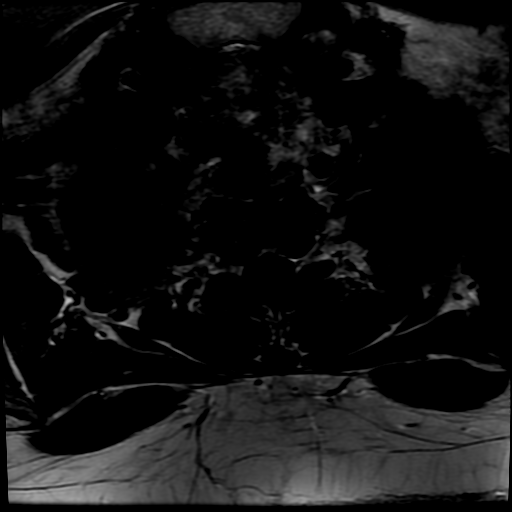
[im 36/36]
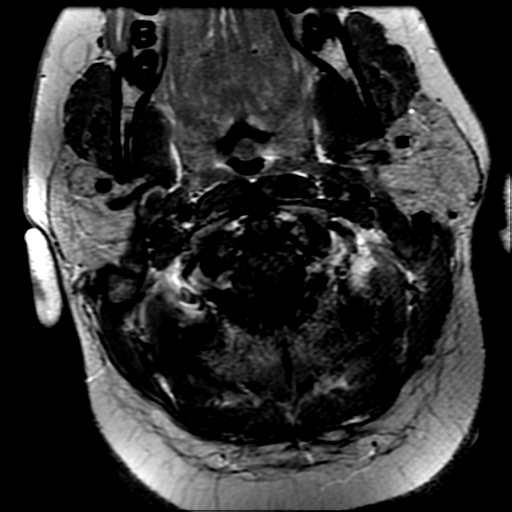

[Series 22: T2 post-contrast · coronal · 5.0mm · 0.39mm/px · 1 of 29 slices shown]
[im 1/29]
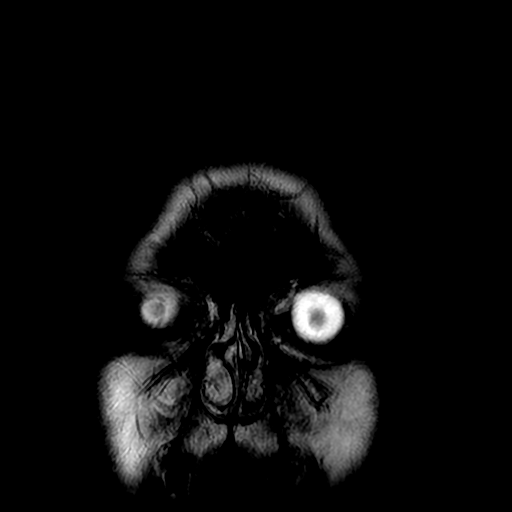

[Series 24: T1 · coronal · 5.0mm · 0.39mm/px · 1 of 29 slices shown (2 of 2)]
[im 1/29]
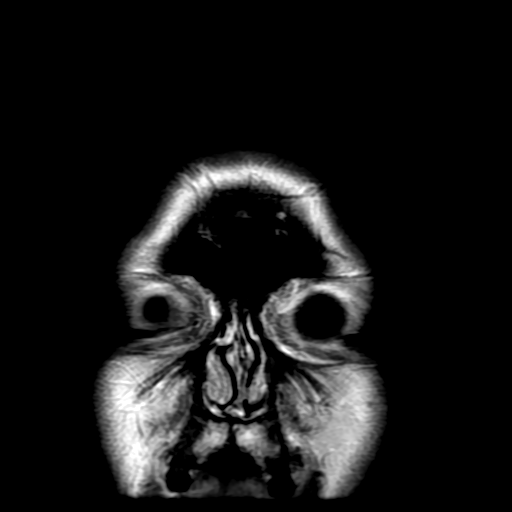

[21 of 48 positions shown; findings below may reference images not displayed]

FINDINGS: MRI HEAD FINDINGS

INTRACRANIAL CONTENTS: Punctate reduced diffusion LEFT periatrial
white matter with low ADC values favoring hyperacute demyelination.
Greater then 10 supratentorial white matter lesions with low T1
signal and T2 shine through, many of which radiate from the
periventricular margin. Dominant lesion RIGHT frontal lobe measuring
11 mm. Juxtacortical and bifrontal cortical lesions. At least 6
infratentorial lesions. Faint enhancement of RIGHT pontine, LEFT
periatrial, RIGHT parietal, RIGHT frontal juxta cortical lesion. No
susceptibility artifact to suggest hemorrhage. The ventricles and
sulci are normal for patient's age. No masses or mass effect. No
abnormal intraparenchymal or extra-axial enhancement. No abnormal
extra-axial fluid collections. No extra-axial masses. Borderline
cerebellar tonsillar ectopia without Chiari 1 malformation.

VASCULAR: Normal major intracranial vascular flow voids present at
skull base.

SKULL AND UPPER CERVICAL SPINE: No abnormal sellar expansion. No
suspicious calvarial bone marrow signal. Craniocervical junction
maintained.

SINUSES/ORBITS: Mild paranasal sinus mucosal thickening, lobulated
maxillary component. Mastoid air cells are well aerated.The included
ocular globes and orbital contents are non-suspicious.

OTHER: None.

MRI CERVICAL SPINE FINDINGS

ALIGNMENT: Straightened cervical lordosis.  No malalignment.

VERTEBRAE/DISCS: Vertebral bodies are intact. Intervertebral disc
morphology's and signal are normal. No acute or abnormal bone marrow
signal. No abnormal osseous or disc enhancement.

CORD:At least 4 spinal cord lesions identified, dominant expansile
lesion is 17 mm in craniocaudad dimension at C6-7 within RIGHT
hemicord with enhancement. Subcentimeter lesions at C2-3 with faint
enhancement. No myelomalacia or syrinx.

POSTERIOR FOSSA, VERTEBRAL ARTERIES, PARASPINAL TISSUES: No MR
findings of ligamentous injury. Vertebral artery flow voids present.
Included posterior fossa and paraspinal soft tissues are normal.

DISC LEVELS:

C2-3 through C4-5: No disc bulge, canal stenosis nor neural
foraminal narrowing.

C5-6: Small central disc protrusion. Mild canal stenosis without
neural foraminal narrowing.

C6-7 and C7-T1: No disc bulge, canal stenosis nor neural foraminal
narrowing.
IMPRESSION: MRI HEAD:

1. Severe demyelination with multiple supra-and infratentorial
lesions varying from hyperacute to acute and chronic.
2. No parenchymal brain volume loss for age.
3. Mild cerebellar tonsillar ectopia without Chiari 1 malformation.

MRI CERVICAL SPINE:

1. Spinal cord demyelination with dominant 17 mm acute enhancing
lesion at C6-7. No myelomalacia.
2. Mild canal stenosis C5-6.

Acute findings discussed with and reconfirmed by Dr.GAB TIGER
on 01/30/2018 at [DATE].

## 2020-05-25 ENCOUNTER — Other Ambulatory Visit: Payer: Self-pay | Admitting: *Deleted

## 2020-05-25 DIAGNOSIS — G35 Multiple sclerosis: Secondary | ICD-10-CM

## 2020-05-25 MED ORDER — MAYZENT 2 MG PO TABS: 1.0000 | ORAL_TABLET | Freq: Every day | ORAL | 11 refills | Status: DC

## 2020-06-08 ENCOUNTER — Telehealth: Payer: Self-pay | Admitting: *Deleted

## 2020-06-08 NOTE — Telephone Encounter (Signed)
Submitted PA Mayzent on CMM. Key: [id number]. Received instant approval: [id number];VAPOLI:[id number];Review Type:Prior Auth;Coverage Start Date:05/09/2020;Coverage End Date:06/08/2021;

## 2020-06-09 ENCOUNTER — Ambulatory Visit: Payer: BC Managed Care – PPO | Admitting: Internal Medicine

## 2020-06-21 ENCOUNTER — Other Ambulatory Visit: Payer: Self-pay

## 2020-06-22 ENCOUNTER — Encounter: Payer: Self-pay | Admitting: Internal Medicine

## 2020-06-22 ENCOUNTER — Ambulatory Visit (INDEPENDENT_AMBULATORY_CARE_PROVIDER_SITE_OTHER): Payer: BC Managed Care – PPO | Admitting: Internal Medicine

## 2020-06-22 VITALS — BP 130/90 | HR 74 | Temp 98.2°F | Wt 304.4 lb

## 2020-06-22 DIAGNOSIS — K5900 Constipation, unspecified: Secondary | ICD-10-CM

## 2020-06-22 NOTE — Progress Notes (Signed)
Established Patient Office Visit     This visit occurred during the SARS-CoV-2 public health emergency.  Safety protocols were in place, including screening questions prior to the visit, additional usage of staff PPE, and extensive cleaning of exam room while observing appropriate contact time as indicated for disinfecting solutions.    CC/Reason for Visit: Constipation  HPI: Amanda Wise is a 35 y.o. female who is coming in today for the above mentioned reasons. Past Medical History is significant for: Multiple sclerosis and morbid obesity.  She has lost an additional 10 pounds since her visit in January.  She has been having constipation now for which she feels like is a few months.  Her bowel movements are usually every 4 to 5 days.  Lately she has noticed some liquid stools.  She does not have blood in her stool, no fever, no abdominal pain.  She has not had a prior colonoscopy.   Past Medical/Surgical History: Past Medical History:  Diagnosis Date  . Anemia   . Heart palpitations   . Hypertension   . MS (multiple sclerosis) (HCC)   . Pregnancy induced hypertension     Past Surgical History:  Procedure Laterality Date  . NO PAST SURGERIES      Social History:  reports that she has never smoked. She has never used smokeless tobacco. She reports that she does not drink alcohol and does not use drugs.  Allergies: No Known Allergies  Family History:  Family History  Problem Relation Age of Onset  . Hypertension Other   . Hypertension Maternal Grandmother   . Hypertension Paternal Grandmother   . Diabetes Father   . High blood pressure Father      Current Outpatient Medications:  .  Siponimod Fumarate (MAYZENT) 2 MG TABS, Take 1 tablet by mouth daily. Mayzent, Disp: 30 tablet, Rfl: 11 .  Armodafinil 200 MG TABS, One po qAM, Disp: 30 tablet, Rfl: 5 .  atorvastatin (LIPITOR) 40 MG tablet, Take 1 tablet (40 mg total) by mouth daily. (Patient not taking:  Reported on 06/22/2020), Disp: 90 tablet, Rfl: 1  Review of Systems:  Constitutional: Denies fever, chills, diaphoresis, appetite change and fatigue.  HEENT: Denies photophobia, eye pain, redness, hearing loss, ear pain, congestion, sore throat, rhinorrhea, sneezing, mouth sores, trouble swallowing, neck pain, neck stiffness and tinnitus.   Respiratory: Denies SOB, DOE, cough, chest tightness,  and wheezing.   Cardiovascular: Denies chest pain, palpitations and leg swelling.  Gastrointestinal: Denies nausea, vomiting, abdominal pain, diarrhea,  blood in stool and abdominal distention.  Genitourinary: Denies dysuria, urgency, frequency, hematuria, flank pain and difficulty urinating.  Endocrine: Denies: hot or cold intolerance, sweats, changes in hair or nails, polyuria, polydipsia. Musculoskeletal: Denies myalgias, back pain, joint swelling, arthralgias and gait problem.  Skin: Denies pallor, rash and wound.  Neurological: Denies dizziness, seizures, syncope, weakness, light-headedness, numbness and headaches.  Hematological: Denies adenopathy. Easy bruising, personal or family bleeding history  Psychiatric/Behavioral: Denies suicidal ideation, mood changes, confusion, nervousness, sleep disturbance and agitation    Physical Exam: Vitals:   06/22/20 0942  BP: 130/90  Pulse: 74  Temp: 98.2 F (36.8 C)  TempSrc: Oral  SpO2: 99%  Weight: (!) 304 lb 6.4 oz (138.1 kg)    Body mass index is 47.68 kg/m.   Constitutional: NAD, calm, comfortable Eyes: PERRL, lids and conjunctivae normal ENMT: Mucous membranes are moist.  Respiratory: clear to auscultation bilaterally, no wheezing, no crackles. Normal respiratory effort. No accessory muscle use.  Cardiovascular: Regular rate and rhythm, no murmurs / rubs / gallops. No extremity edema.  Neurologic: Grossly intact and nonfocal Psychiatric: Normal judgment and insight. Alert and oriented x 3. Normal mood.    Impression and  Plan:  Constipation, unspecified constipation type -I have advised that she increase her water intake, take a daily fiber supplement. -She should take Colace twice daily as well as MiraLAX twice daily. -She is to eventually aim to have a soft bowel movement at a minimum of every other day.   Patient Instructions   -Nice seeing you today!!  -Make sure you increase your water intake. Take a fiber supplement daily (metamucil/benefiber are some examples).  -Take Colace 100 mg twice daily.  -Take Miralax twice daily.  -Schedule follow up in 6 months for your physical. Please come in fasting that day.   Constipation, Adult Constipation is when a person has trouble pooping (having a bowel movement). When you have this condition, you may poop fewer than 3 times a week. Your poop (stool) may also be dry, hard, or bigger than normal. Follow these instructions at home: Eating and drinking  Eat foods that have a lot of fiber, such as: ? Fresh fruits and vegetables. ? Whole grains. ? Beans.  Eat less of foods that are low in fiber and high in fat and sugar, such as: ? [place] fries. ? Hamburgers. ? Cookies. ? Candy. ? Soda.  Drink enough fluid to keep your pee (urine) pale yellow.   General instructions  Exercise regularly or as told by your doctor. Try to do 150 minutes of exercise each week.  Go to the restroom when you feel like you need to poop. Do not hold it in.  Take over-the-counter and prescription medicines only as told by your doctor. These include any fiber supplements.  When you poop: ? Do deep breathing while relaxing your lower belly (abdomen). ? Relax your pelvic floor. The pelvic floor is a group of muscles that support the rectum, bladder, and intestines (as well as the uterus in women).  Watch your condition for any changes. Tell your doctor if you notice any.  Keep all follow-up visits as told by your doctor. This is important. Contact a doctor if:  You  have pain that gets worse.  You have a fever.  You have not pooped for 4 days.  You vomit.  You are not hungry.  You lose weight.  You are bleeding from the opening of the butt (anus).  You have thin, pencil-like poop. Get help right away if:  You have a fever, and your symptoms suddenly get worse.  You leak poop or have blood in your poop.  Your belly feels hard or bigger than normal (bloated).  You have very bad belly pain.  You feel dizzy or you faint. Summary  Constipation is when a person poops fewer than 3 times a week, has trouble pooping, or has poop that is dry, hard, or bigger than normal.  Eat foods that have a lot of fiber.  Drink enough fluid to keep your pee (urine) pale yellow.  Take over-the-counter and prescription medicines only as told by your doctor. These include any fiber supplements. This information is not intended to replace advice given to you by your health care provider. Make sure you discuss any questions you have with your health care provider. Document Revised: 12/10/2018 Document Reviewed: 12/10/2018 Elsevier Patient Education  2021 Elsevier Inc.      Chaya Jan, MD [place]

## 2020-06-22 NOTE — Patient Instructions (Signed)
-  Nice seeing you today!!  -Make sure you increase your water intake. Take a fiber supplement daily (metamucil/benefiber are some examples).  -Take Colace 100 mg twice daily.  -Take Miralax twice daily.  -Schedule follow up in 6 months for your physical. Please come in fasting that day.   Constipation, Adult Constipation is when a person has trouble pooping (having a bowel movement). When you have this condition, you may poop fewer than 3 times a week. Your poop (stool) may also be dry, hard, or bigger than normal. Follow these instructions at home: Eating and drinking  Eat foods that have a lot of fiber, such as: ? Fresh fruits and vegetables. ? Whole grains. ? Beans.  Eat less of foods that are low in fiber and high in fat and sugar, such as: ? [place] fries. ? Hamburgers. ? Cookies. ? Candy. ? Soda.  Drink enough fluid to keep your pee (urine) pale yellow.   General instructions  Exercise regularly or as told by your doctor. Try to do 150 minutes of exercise each week.  Go to the restroom when you feel like you need to poop. Do not hold it in.  Take over-the-counter and prescription medicines only as told by your doctor. These include any fiber supplements.  When you poop: ? Do deep breathing while relaxing your lower belly (abdomen). ? Relax your pelvic floor. The pelvic floor is a group of muscles that support the rectum, bladder, and intestines (as well as the uterus in women).  Watch your condition for any changes. Tell your doctor if you notice any.  Keep all follow-up visits as told by your doctor. This is important. Contact a doctor if:  You have pain that gets worse.  You have a fever.  You have not pooped for 4 days.  You vomit.  You are not hungry.  You lose weight.  You are bleeding from the opening of the butt (anus).  You have thin, pencil-like poop. Get help right away if:  You have a fever, and your symptoms suddenly get worse.  You  leak poop or have blood in your poop.  Your belly feels hard or bigger than normal (bloated).  You have very bad belly pain.  You feel dizzy or you faint. Summary  Constipation is when a person poops fewer than 3 times a week, has trouble pooping, or has poop that is dry, hard, or bigger than normal.  Eat foods that have a lot of fiber.  Drink enough fluid to keep your pee (urine) pale yellow.  Take over-the-counter and prescription medicines only as told by your doctor. These include any fiber supplements. This information is not intended to replace advice given to you by your health care provider. Make sure you discuss any questions you have with your health care provider. Document Revised: 12/10/2018 Document Reviewed: 12/10/2018 Elsevier Patient Education  2021 [employer].

## 2020-07-07 ENCOUNTER — Other Ambulatory Visit: Payer: Self-pay

## 2020-07-08 ENCOUNTER — Ambulatory Visit (INDEPENDENT_AMBULATORY_CARE_PROVIDER_SITE_OTHER): Payer: BC Managed Care – PPO | Admitting: Internal Medicine

## 2020-07-08 ENCOUNTER — Encounter: Payer: Self-pay | Admitting: Internal Medicine

## 2020-07-08 VITALS — BP 130/90 | HR 74 | Temp 97.9°F | Wt 313.1 lb

## 2020-07-08 DIAGNOSIS — K5900 Constipation, unspecified: Secondary | ICD-10-CM | POA: Diagnosis not present

## 2020-07-08 MED ORDER — MAGNESIUM CITRATE PO SOLN: 1.0000 | Freq: Once | ORAL | 1 refills | Status: AC

## 2020-07-08 NOTE — Progress Notes (Signed)
Established Patient Office Visit     This visit occurred during the SARS-CoV-2 public health emergency.  Safety protocols were in place, including screening questions prior to the visit, additional usage of staff PPE, and extensive cleaning of exam room while observing appropriate contact time as indicated for disinfecting solutions.    CC/Reason for Visit: Follow-up constipation  HPI: Amanda Wise is a 35 y.o. female who is coming in today for the above mentioned reasons.  I saw her on 5/18 for constipation.  At that point she was having 1 bowel movement every 4 to 5 days.  She was starting to feel some bloating.  She was advised to take MiraLAX and Colace twice a day and to aim for soft bowel movement at least every other day.  She initially was compliant.  She was having 1-2 bowel movements a day.  Then she backtracked to taking the medication maybe once a day and is constipated again.  Past Medical/Surgical History: Past Medical History:  Diagnosis Date  . Anemia   . Heart palpitations   . Hypertension   . MS (multiple sclerosis) (HCC)   . Pregnancy induced hypertension     Past Surgical History:  Procedure Laterality Date  . NO PAST SURGERIES      Social History:  reports that she has never smoked. She has never used smokeless tobacco. She reports that she does not drink alcohol and does not use drugs.  Allergies: No Known Allergies  Family History:  Family History  Problem Relation Age of Onset  . Hypertension Other   . Hypertension Maternal Grandmother   . Hypertension Paternal Grandmother   . Diabetes Father   . High blood pressure Father      Current Outpatient Medications:  .  Armodafinil 200 MG TABS, One po qAM, Disp: 30 tablet, Rfl: 5 .  atorvastatin (LIPITOR) 40 MG tablet, Take 1 tablet (40 mg total) by mouth daily., Disp: 90 tablet, Rfl: 1 .  magnesium citrate SOLN, Take 296 mLs (1 Bottle total) by mouth once for 1 dose., Disp: 195 mL,  Rfl: 1 .  Siponimod Fumarate (MAYZENT) 2 MG TABS, Take 1 tablet by mouth daily. Mayzent, Disp: 30 tablet, Rfl: 11  Review of Systems:  Constitutional: Denies fever, chills, diaphoresis, appetite change and fatigue.  HEENT: Denies photophobia, eye pain, redness, hearing loss, ear pain, congestion, sore throat, rhinorrhea, sneezing, mouth sores, trouble swallowing, neck pain, neck stiffness and tinnitus.   Respiratory: Denies SOB, DOE, cough, chest tightness,  and wheezing.   Cardiovascular: Denies chest pain, palpitations and leg swelling.  Gastrointestinal: Denies nausea, vomiting, abdominal pain, diarrhea, blood in stool. Genitourinary: Denies dysuria, urgency, frequency, hematuria, flank pain and difficulty urinating.  Endocrine: Denies: hot or cold intolerance, sweats, changes in hair or nails, polyuria, polydipsia. Musculoskeletal: Denies myalgias, back pain, joint swelling, arthralgias and gait problem.  Skin: Denies pallor, rash and wound.  Neurological: Denies dizziness, seizures, syncope, weakness, light-headedness, numbness and headaches.  Hematological: Denies adenopathy. Easy bruising, personal or family bleeding history  Psychiatric/Behavioral: Denies suicidal ideation, mood changes, confusion, nervousness, sleep disturbance and agitation    Physical Exam: Vitals:   07/08/20 1008  BP: 130/90  Pulse: 74  Temp: 97.9 F (36.6 C)  TempSrc: Oral  SpO2: 99%  Weight: (!) 313 lb 1.6 oz (142 kg)    Body mass index is 49.04 kg/m.   Constitutional: NAD, calm, comfortable Eyes: PERRL, lids and conjunctivae normal ENMT: Mucous membranes are moist.  Abdomen:  no tenderness, no masses palpated. No hepatosplenomegaly. Bowel sounds positive.  Neurologic: Grossly intact and nonfocal Psychiatric: Normal judgment and insight. Alert and oriented x 3. Normal mood.    Impression and Plan:  Constipation, unspecified constipation type  -I will prescribe mag citrate for her to take  today. -Advised to continue MiraLAX and Colace with a goal of at least 1 soft bowel movement every other day.   Patient Instructions   -Nice seeing you today!!  -Take 1 bottle of Mag citrate.  -Continue taking miralax and colace twice a day with the goal of 1 soft bowel movement at least every other day.   Constipation, Adult Constipation is when a person has trouble pooping (having a bowel movement). When you have this condition, you may poop fewer than 3 times a week. Your poop (stool) may also be dry, hard, or bigger than normal. Follow these instructions at home: Eating and drinking  Eat foods that have a lot of fiber, such as: ? Fresh fruits and vegetables. ? Whole grains. ? Beans.  Eat less of foods that are low in fiber and high in fat and sugar, such as: ? [place] fries. ? Hamburgers. ? Cookies. ? Candy. ? Soda.  Drink enough fluid to keep your pee (urine) pale yellow.   General instructions  Exercise regularly or as told by your doctor. Try to do 150 minutes of exercise each week.  Go to the restroom when you feel like you need to poop. Do not hold it in.  Take over-the-counter and prescription medicines only as told by your doctor. These include any fiber supplements.  When you poop: ? Do deep breathing while relaxing your lower belly (abdomen). ? Relax your pelvic floor. The pelvic floor is a group of muscles that support the rectum, bladder, and intestines (as well as the uterus in women).  Watch your condition for any changes. Tell your doctor if you notice any.  Keep all follow-up visits as told by your doctor. This is important. Contact a doctor if:  You have pain that gets worse.  You have a fever.  You have not pooped for 4 days.  You vomit.  You are not hungry.  You lose weight.  You are bleeding from the opening of the butt (anus).  You have thin, pencil-like poop. Get help right away if:  You have a fever, and your symptoms suddenly  get worse.  You leak poop or have blood in your poop.  Your belly feels hard or bigger than normal (bloated).  You have very bad belly pain.  You feel dizzy or you faint. Summary  Constipation is when a person poops fewer than 3 times a week, has trouble pooping, or has poop that is dry, hard, or bigger than normal.  Eat foods that have a lot of fiber.  Drink enough fluid to keep your pee (urine) pale yellow.  Take over-the-counter and prescription medicines only as told by your doctor. These include any fiber supplements. This information is not intended to replace advice given to you by your health care provider. Make sure you discuss any questions you have with your health care provider. Document Revised: 12/10/2018 Document Reviewed: 12/10/2018 Elsevier Patient Education  2021 Elsevier Inc.      Chaya Jan, MD [facility]

## 2020-07-08 NOTE — Patient Instructions (Signed)
-  Nice seeing you today!!  -Take 1 bottle of Mag citrate.  -Continue taking miralax and colace twice a day with the goal of 1 soft bowel movement at least every other day.   Constipation, Adult Constipation is when a person has trouble pooping (having a bowel movement). When you have this condition, you may poop fewer than 3 times a week. Your poop (stool) may also be dry, hard, or bigger than normal. Follow these instructions at home: Eating and drinking  Eat foods that have a lot of fiber, such as: ? Fresh fruits and vegetables. ? Whole grains. ? Beans.  Eat less of foods that are low in fiber and high in fat and sugar, such as: ? [place] fries. ? Hamburgers. ? Cookies. ? Candy. ? Soda.  Drink enough fluid to keep your pee (urine) pale yellow.   General instructions  Exercise regularly or as told by your doctor. Try to do 150 minutes of exercise each week.  Go to the restroom when you feel like you need to poop. Do not hold it in.  Take over-the-counter and prescription medicines only as told by your doctor. These include any fiber supplements.  When you poop: ? Do deep breathing while relaxing your lower belly (abdomen). ? Relax your pelvic floor. The pelvic floor is a group of muscles that support the rectum, bladder, and intestines (as well as the uterus in women).  Watch your condition for any changes. Tell your doctor if you notice any.  Keep all follow-up visits as told by your doctor. This is important. Contact a doctor if:  You have pain that gets worse.  You have a fever.  You have not pooped for 4 days.  You vomit.  You are not hungry.  You lose weight.  You are bleeding from the opening of the butt (anus).  You have thin, pencil-like poop. Get help right away if:  You have a fever, and your symptoms suddenly get worse.  You leak poop or have blood in your poop.  Your belly feels hard or bigger than normal (bloated).  You have very bad belly  pain.  You feel dizzy or you faint. Summary  Constipation is when a person poops fewer than 3 times a week, has trouble pooping, or has poop that is dry, hard, or bigger than normal.  Eat foods that have a lot of fiber.  Drink enough fluid to keep your pee (urine) pale yellow.  Take over-the-counter and prescription medicines only as told by your doctor. These include any fiber supplements. This information is not intended to replace advice given to you by your health care provider. Make sure you discuss any questions you have with your health care provider. Document Revised: 12/10/2018 Document Reviewed: 12/10/2018 Elsevier Patient Education  2021 [employer].

## 2020-08-04 ENCOUNTER — Encounter: Payer: Self-pay | Admitting: Internal Medicine

## 2020-09-01 ENCOUNTER — Ambulatory Visit (INDEPENDENT_AMBULATORY_CARE_PROVIDER_SITE_OTHER): Payer: BC Managed Care – PPO | Admitting: Neurology

## 2020-09-01 ENCOUNTER — Encounter: Payer: Self-pay | Admitting: Neurology

## 2020-09-01 VITALS — BP 142/91 | HR 86 | Ht 67.0 in | Wt 315.5 lb

## 2020-09-01 DIAGNOSIS — G4719 Other hypersomnia: Secondary | ICD-10-CM

## 2020-09-01 DIAGNOSIS — G4733 Obstructive sleep apnea (adult) (pediatric): Secondary | ICD-10-CM | POA: Diagnosis not present

## 2020-09-01 DIAGNOSIS — G35 Multiple sclerosis: Secondary | ICD-10-CM

## 2020-09-01 DIAGNOSIS — R41840 Attention and concentration deficit: Secondary | ICD-10-CM

## 2020-09-01 DIAGNOSIS — Z79899 Other long term (current) drug therapy: Secondary | ICD-10-CM

## 2020-09-01 DIAGNOSIS — H469 Unspecified optic neuritis: Secondary | ICD-10-CM

## 2020-09-01 MED ORDER — AMPHETAMINE-DEXTROAMPHETAMINE 10 MG PO TABS: ORAL_TABLET | ORAL | 0 refills | Status: DC

## 2020-09-01 NOTE — Progress Notes (Signed)
[facility]  PATIENT: Amanda Wise DOB: Aug 11, 1985  REFERRING DOCTOR OR PCP: Ritta Slot SOURCE: Patient, notes from emergency room and hospital admission, laboratory reports, imaging reports, MRI images personally reviewed.  _________________________________   HISTORICAL  CHIEF COMPLAINT:  Chief Complaint  Patient presents with   Follow-up    RM 1, alone. Last seen 03/03/20. On Mayzent for MS. OSA- not on cpap (cost issue). No changes, doing ok.    HISTORY OF PRESENT ILLNESS:  Amanda Wise is a 35 y.o. woman who was diagnosed with multiple sclerosis in December 2019.  Update 09/01/2020: She has been on siponimod and she tolerates it well. No exacerbation since her last visit       Last MRI was April 2021 showed no new MS lesions.  The visual changes she had at the last visit have improved.   Colors are no longer desaturated OD.     Strength and sensation are doing well.  Gait is fine.   Balance is doing well.   Does not need to use the bannister.      Right facial myokymia resolved.    Vision as above.   Bladder is doing well.    Fatigue is still a problem.     She has reduced sleep - only 4 hours a day during the week and 5-6 on weekends.     She also mild cognitive issues - forgetful, reduced focus/attention and some word finding issues..   Mood is fine now.    She has OSA, (mild overall but severe during REM).  She felt much better on CPAP but insurance did not cover it well.   Due to the expense she opted not to stay  on CPAP.  EPWORTH SLEEPINESS SCALE  On a scale of 0 - 3 what is the chance of dozing:  Sitting and Reading:   2 Watching TV:    3 Sitting inactive in a public place: 1 Passenger in car for one hour: 3 Lying down to rest in the afternoon: 2 Sitting and talking to someone: 0 Sitting quietly after lunch:  1 In a car, stopped in traffic:  0  Total (out of 24):   12/24   Mild EDS    MS HISTORY: She had an episode  of bilateral leg weakness in 2012, she improved over 4-5 months.  She felt symptoms were due to working out.  In 2016, she had the onset of poor balance x 3-6 months that completely resolved.    She never sought out medical/neurologic care.  In August 2019, she had some weakness and numbness in her legs for a couple weeks but then improved after she saw a Land.    In December 2019,  she had the onset of tingling in both hands and right hand weakness.   She went to the ED.  She had MRI of the brain and MRI of the cervical spine and they were consistent with MS.  She was admitted and received 5 days of IV Solu-Medrol.   She felt better by her discharge, closer to her baseline.   She went on Tysabri but due to cost switched to a siponimod drug study in 2022.   She has been on siponimod/Mayzent since January 2021     IMAGING: MRI of the brain and cervical spine dated 01/30/2018.  The brain shows multiple T2/flair hyperintense foci in the hemispheres in the periventricular, juxtacortical and deep white matter.  There are 2 foci peripherally in  the pons and another focus in the left middle cerebellar peduncle.  The one pontine focus enhances.  2 small foci in the hemispheres enhance.  The MRI of the cervical spine shows several foci in the spinal cord including 1 at C3 to the left and 1 at C6-C7 on the right that enhance.    The other foci located at C2-C3 posteriorly and C3 posteriorly to the right.  MRI of the brain 05/23/2019 showed multiple T2/FLAIR hyperintense foci in the brainstem, cerebellum and cerebral hemispheres in a pattern and configuration consistent with chronic demyelinating plaque associated with multiple sclerosis. None of the foci appear to be acute. Compared to the MRI from 01/30/2018, there are no new lesions. Several foci that were enhancing in 2019 no longer do so.   Other studies: Polysomnography 04/23/2018 showed mild overall AHI (10.6) that was severe during REM sleep (REM AHI  equals 37.9).  There is no significant restless leg syndrome.   She tolerated CPAP (APAP) but had high copays.     REVIEW OF SYSTEMS: Constitutional: No fevers, chills, sweats, or change in appetite Eyes: No visual changes, double vision, eye pain Ear, nose and throat: No hearing loss, ear pain, nasal congestion, sore throat Cardiovascular: No chest pain, palpitations Respiratory:  No shortness of breath at rest or with exertion.   No wheezes GastrointestinaI: No nausea, vomiting, diarrhea, abdominal pain, fecal incontinence Genitourinary:  No dysuria, urinary retention or frequency.  No nocturia. Musculoskeletal:  No neck pain, back pain Integumentary: No rash, pruritus, skin lesions Neurological: as above Psychiatric: No depression at this time.  No anxiety Endocrine: No palpitations, diaphoresis, change in appetite, change in weigh or increased thirst Hematologic/Lymphatic:  No anemia, purpura, petechiae. Allergic/Immunologic: No itchy/runny eyes, nasal congestion, recent allergic reactions, rashes  ALLERGIES: No Known Allergies  HOME MEDICATIONS:  Current Outpatient Medications:    amphetamine-dextroamphetamine (ADDERALL) 10 MG tablet, Take one pill po bid., Disp: 60 tablet, Rfl: 0   Armodafinil 200 MG TABS, One po qAM, Disp: 30 tablet, Rfl: 5   atorvastatin (LIPITOR) 40 MG tablet, Take 1 tablet (40 mg total) by mouth daily., Disp: 90 tablet, Rfl: 1   Siponimod Fumarate (MAYZENT) 2 MG TABS, Take 1 tablet by mouth daily. Mayzent, Disp: 30 tablet, Rfl: 11  PAST MEDICAL HISTORY: Past Medical History:  Diagnosis Date   Anemia    Heart palpitations    Hypertension    MS (multiple sclerosis) (HCC)    Pregnancy induced hypertension     PAST SURGICAL HISTORY: Past Surgical History:  Procedure Laterality Date   NO PAST SURGERIES      FAMILY HISTORY: Family History  Problem Relation Age of Onset   Hypertension Other    Hypertension Maternal Grandmother    Hypertension  Paternal Grandmother    Diabetes Father    High blood pressure Father     SOCIAL HISTORY:  Social History   Socioeconomic History   Marital status: Single    Spouse name: Not on file   Number of children: 2   Years of education: 14   Highest education level: Not on file  Occupational History   Occupation: Orthoptist  Tobacco Use   Smoking status: Never   Smokeless tobacco: Never  Substance and Sexual Activity   Alcohol use: No   Drug use: No   Sexual activity: Yes    Birth control/protection: None  Other Topics Concern   Not on file  Social History Narrative   Lives with family  Caffeine use: soda sometimes   Right handed    Social Determinants of Health   Financial Resource Strain: Not on file  Food Insecurity: Not on file  Transportation Needs: Not on file  Physical Activity: Not on file  Stress: Not on file  Social Connections: Not on file  Intimate Partner Violence: Not on file     PHYSICAL EXAM  Vitals:   09/01/20 1515  BP: (!) 142/91  Pulse: 86  Weight: (!) 315 lb 8 oz (143.1 kg)  Height: 5\' 7"  (1.702 m)    Body mass index is 49.41 kg/m.  No results found.   General: The patient is well-developed and well-nourished and in no acute distress.  Abdomen is nontender with normal sounds.  Eyes:  Funduscopic exam shows normal optic discs and retinal vessels.  Neck: No thyromegaly.  The neck is supple, no carotid bruits are noted.  The neck is nontender.  Cardiovascular: The heart has a regular rate and rhythm with a normal S1 and S2. There were no murmurs, gallops or rubs. Lungs are clear to auscultation.  Skin: Extremities are without rash or edema.  Musculoskeletal: Joints are nontender.  Back is nontender  Neurologic Exam  Mental status: The patient is alert and oriented x 3 at the time of the examination. The patient has apparent normal recent and remote memory, with an apparently normal attention span and concentration  ability.   Speech is normal.  Cranial nerves: Extraocular movements are full.  She has a mild right APD.  Facial strength was normal.  Myokymia om the right.  Trapezius and sternocleidomastoid strength is normal. No dysarthria is noted.  Hearing appears normal and symmetric  Motor:  Muscle bulk is normal.   Tone is normal. Strength is  5 / 5 in all 4 extremities.   Sensory: She has intact sensation to touch and vibration in the arms and legs.  Coordination: Cerebellar testing reveals good finger-nose-finger and heel-to-shin bilaterally.  Gait and station: Station is normal.   Gait is normal.  Tandem gait is mildly wide.  Romberg is negative.  Reflexes: Deep tendon reflexes are symmetric and normal in arms and increased in legs without clonus.       DIAGNOSTIC DATA (LABS, IMAGING, TESTING) - I reviewed patient records, labs, notes, testing and imaging myself where available.  Lab Results  Component Value Date   WBC 3.1 (L) 09/01/2020   HGB 12.5 09/01/2020   HCT 35.7 09/01/2020   MCV 87 09/01/2020   PLT 278 09/01/2020      Component Value Date/Time   NA 137 05/03/2020 2125   K 3.7 05/03/2020 2125   CL 103 05/03/2020 2125   CO2 24 05/03/2020 2125   GLUCOSE 85 05/03/2020 2125   BUN 13 05/03/2020 2125   CREATININE 1.01 (H) 05/03/2020 2125   CREATININE 1.06 12/30/2019 0856   CALCIUM 8.9 05/03/2020 2125   PROT 6.9 09/01/2020 1601   ALBUMIN 4.1 09/01/2020 1601   AST 16 09/01/2020 1601   ALT 16 09/01/2020 1601   ALKPHOS 65 09/01/2020 1601   BILITOT 0.3 09/01/2020 1601   GFRNONAA >60 05/03/2020 2125   GFRAA >60 12/22/2018 0240     ____________________________________________________________   1. Multiple sclerosis (HCC)   2. High risk medication use   3. OSA (obstructive sleep apnea)   4. Excessive daytime sleepiness   5. Attention deficit   6. Optic neuritis      1.    Continue Mayzent.   Check CBC/Diff and  LFT.    2.    Stay active and exercise as tolerated. 3.     Adderall EDS (has OSA) and ADD .  We also discussed reconsideration of CPAP (she tolerated but insurance had high copays).  . 4.    rtc 6 months or sooner if new or worsening neurologic issues.  Darrelle Barrell A. Epimenio Foot, MD, [facility] 09/04/2020, 7:06 PM Certified in Neurology, Clinical Neurophysiology, Sleep Medicine, Pain Medicine and Neuroimaging  [facility] [address] [place], [place] [address] [phone]

## 2020-09-02 LAB — CBC WITH DIFFERENTIAL/PLATELET
Basophils Absolute: 0 10*3/uL (ref 0.0–0.2)
Basos: 1 %
EOS (ABSOLUTE): 0.1 10*3/uL (ref 0.0–0.4)
Eos: 5 %
Hematocrit: 35.7 % (ref 34.0–46.6)
Hemoglobin: 12.5 g/dL (ref 11.1–15.9)
Immature Grans (Abs): 0 10*3/uL (ref 0.0–0.1)
Immature Granulocytes: 0 %
Lymphocytes Absolute: 0.6 10*3/uL — ABNORMAL LOW (ref 0.7–3.1)
Lymphs: 21 %
MCH: 30.6 pg (ref 26.6–33.0)
MCHC: 35 g/dL (ref 31.5–35.7)
MCV: 87 fL (ref 79–97)
Monocytes Absolute: 0.6 10*3/uL (ref 0.1–0.9)
Monocytes: 19 %
Neutrophils Absolute: 1.7 10*3/uL (ref 1.4–7.0)
Neutrophils: 54 %
Platelets: 278 10*3/uL (ref 150–450)
RBC: 4.09 x10E6/uL (ref 3.77–5.28)
RDW: 12.1 % (ref 11.7–15.4)
WBC: 3.1 10*3/uL — ABNORMAL LOW (ref 3.4–10.8)

## 2020-09-02 LAB — HEPATIC FUNCTION PANEL
ALT: 16 IU/L (ref 0–32)
AST: 16 IU/L (ref 0–40)
Albumin: 4.1 g/dL (ref 3.8–4.8)
Alkaline Phosphatase: 65 IU/L (ref 44–121)
Bilirubin Total: 0.3 mg/dL (ref 0.0–1.2)
Bilirubin, Direct: <0.1 mg/dL (ref 0.00–0.40)
Total Protein: 6.9 g/dL (ref 6.0–8.5)

## 2020-09-14 ENCOUNTER — Other Ambulatory Visit: Payer: Self-pay | Admitting: *Deleted

## 2020-09-14 DIAGNOSIS — G35 Multiple sclerosis: Secondary | ICD-10-CM

## 2020-09-14 MED ORDER — MAYZENT 2 MG PO TABS: 1.0000 | ORAL_TABLET | Freq: Every day | ORAL | 3 refills | Status: DC

## 2020-12-27 ENCOUNTER — Encounter: Payer: BC Managed Care – PPO | Admitting: Internal Medicine

## 2021-01-03 ENCOUNTER — Ambulatory Visit (INDEPENDENT_AMBULATORY_CARE_PROVIDER_SITE_OTHER): Payer: BC Managed Care – PPO | Admitting: Internal Medicine

## 2021-01-03 ENCOUNTER — Encounter: Payer: Self-pay | Admitting: Internal Medicine

## 2021-01-03 VITALS — BP 110/80 | HR 73 | Temp 98.0°F | Ht 67.0 in | Wt 309.3 lb

## 2021-01-03 DIAGNOSIS — H469 Unspecified optic neuritis: Secondary | ICD-10-CM | POA: Diagnosis not present

## 2021-01-03 DIAGNOSIS — E782 Mixed hyperlipidemia: Secondary | ICD-10-CM | POA: Diagnosis not present

## 2021-01-03 DIAGNOSIS — G35 Multiple sclerosis: Secondary | ICD-10-CM

## 2021-01-03 DIAGNOSIS — Z0000 Encounter for general adult medical examination without abnormal findings: Secondary | ICD-10-CM

## 2021-01-03 DIAGNOSIS — Z23 Encounter for immunization: Secondary | ICD-10-CM | POA: Diagnosis not present

## 2021-01-03 LAB — COMPREHENSIVE METABOLIC PANEL
ALT: 51 U/L — ABNORMAL HIGH (ref 0–35)
AST: 30 U/L (ref 0–37)
Albumin: 4 g/dL (ref 3.5–5.2)
Alkaline Phosphatase: 73 U/L (ref 39–117)
BUN: 12 mg/dL (ref 6–23)
CO2: 28 mEq/L (ref 19–32)
Calcium: 9.2 mg/dL (ref 8.4–10.5)
Chloride: 102 mEq/L (ref 96–112)
Creatinine, Ser: 1.02 mg/dL (ref 0.40–1.20)
GFR: 70.57 mL/min (ref >60.00)
Glucose, Bld: 77 mg/dL (ref 70–99)
Potassium: 3.5 mEq/L (ref 3.5–5.1)
Sodium: 138 mEq/L (ref 135–145)
Total Bilirubin: 0.8 mg/dL (ref 0.2–1.2)
Total Protein: 7.2 g/dL (ref 6.0–8.3)

## 2021-01-03 LAB — LIPID PANEL
Cholesterol: 178 mg/dL (ref 0–200)
HDL: 51.8 mg/dL (ref >39.00)
LDL Cholesterol: 102 mg/dL — ABNORMAL HIGH (ref 0–99)
NonHDL: 125.8
Total CHOL/HDL Ratio: 3
Triglycerides: 118 mg/dL (ref 0.0–149.0)
VLDL: 23.6 mg/dL (ref 0.0–40.0)

## 2021-01-03 LAB — CBC WITH DIFFERENTIAL/PLATELET
Basophils Absolute: 0 10*3/uL (ref 0.0–0.1)
Basophils Relative: 0.4 % (ref 0.0–3.0)
Eosinophils Absolute: 0.1 10*3/uL (ref 0.0–0.7)
Eosinophils Relative: 3.7 % (ref 0.0–5.0)
HCT: 37.3 % (ref 36.0–46.0)
Hemoglobin: 12.5 g/dL (ref 12.0–15.0)
Lymphocytes Relative: 12.7 % (ref 12.0–46.0)
Lymphs Abs: 0.5 10*3/uL — ABNORMAL LOW (ref 0.7–4.0)
MCHC: 33.4 g/dL (ref 30.0–36.0)
MCV: 89.9 fl (ref 78.0–100.0)
Monocytes Absolute: 0.7 10*3/uL (ref 0.1–1.0)
Monocytes Relative: 16.9 % — ABNORMAL HIGH (ref 3.0–12.0)
Neutro Abs: 2.6 10*3/uL (ref 1.4–7.7)
Neutrophils Relative %: 66.3 % (ref 43.0–77.0)
Platelets: 287 10*3/uL (ref 150.0–400.0)
RBC: 4.15 Mil/uL (ref 3.87–5.11)
RDW: 13.6 % (ref 11.5–15.5)
WBC: 4 10*3/uL (ref 4.0–10.5)

## 2021-01-03 LAB — TSH: TSH: 0.77 u[IU]/mL (ref 0.35–5.50)

## 2021-01-03 LAB — VITAMIN B12: Vitamin B-12: 353 pg/mL (ref 211–911)

## 2021-01-03 LAB — HEMOGLOBIN A1C: Hgb A1c MFr Bld: 5.3 % (ref 4.6–6.5)

## 2021-01-03 LAB — VITAMIN D 25 HYDROXY (VIT D DEFICIENCY, FRACTURES): VITD: 23.6 ng/mL — ABNORMAL LOW (ref 30.00–100.00)

## 2021-01-03 NOTE — Addendum Note (Signed)
Addended by: Kern Reap B on: 01/03/2021 10:29 AM   Modules accepted: Orders

## 2021-01-03 NOTE — Progress Notes (Signed)
Established Patient Office Visit     This visit occurred during the SARS-CoV-2 public health emergency.  Safety protocols were in place, including screening questions prior to the visit, additional usage of staff PPE, and extensive cleaning of exam room while observing appropriate contact time as indicated for disinfecting solutions.    CC/Reason for Visit: Annual preventive exam  HPI: Candee Heidorn is a 35 y.o. female who is coming in today for the above mentioned reasons. Past Medical History is significant for: Multiple sclerosis with history of optic neuritis followed by neurology and morbid obesity.  She has been doing well since we last spoke, her constipation has now resolved.  She is overdue for flu and COVID booster.  She is overdue for eye and dental exams.  She had a Pap smear that was negative in 2021.   Past Medical/Surgical History: Past Medical History:  Diagnosis Date   Anemia    Heart palpitations    Hypertension    MS (multiple sclerosis) ([place])    Pregnancy induced hypertension     Past Surgical History:  Procedure Laterality Date   NO PAST SURGERIES      Social History:  reports that she has never smoked. She has never used smokeless tobacco. She reports that she does not drink alcohol and does not use drugs.  Allergies: No Known Allergies  Family History:  Family History  Problem Relation Age of Onset   Hypertension Other    Hypertension Maternal Grandmother    Hypertension Paternal Grandmother    Diabetes Father    High blood pressure Father      Current Outpatient Medications:    amphetamine-dextroamphetamine (ADDERALL) 10 MG tablet, Take one pill po bid., Disp: 60 tablet, Rfl: 0   Armodafinil 200 MG TABS, One po qAM, Disp: 30 tablet, Rfl: 5   atorvastatin (LIPITOR) 40 MG tablet, Take 1 tablet (40 mg total) by mouth daily., Disp: 90 tablet, Rfl: 1   Siponimod Fumarate (MAYZENT) 2 MG TABS, Take 1 tablet by mouth daily. Mayzent,  Disp: 90 tablet, Rfl: 3  Review of Systems:  Constitutional: Denies fever, chills, diaphoresis, appetite change and fatigue.  HEENT: Denies photophobia, eye pain, redness, hearing loss, ear pain, congestion, sore throat, rhinorrhea, sneezing, mouth sores, trouble swallowing, neck pain, neck stiffness and tinnitus.   Respiratory: Denies SOB, DOE, cough, chest tightness,  and wheezing.   Cardiovascular: Denies chest pain, palpitations and leg swelling.  Gastrointestinal: Denies nausea, vomiting, abdominal pain, diarrhea, constipation, blood in stool and abdominal distention.  Genitourinary: Denies dysuria, urgency, frequency, hematuria, flank pain and difficulty urinating.  Endocrine: Denies: hot or cold intolerance, sweats, changes in hair or nails, polyuria, polydipsia. Musculoskeletal: Denies myalgias, back pain, joint swelling, arthralgias and gait problem.  Skin: Denies pallor, rash and wound.  Neurological: Denies dizziness, seizures, syncope, weakness, light-headedness, numbness and headaches.  Hematological: Denies adenopathy. Easy bruising, personal or family bleeding history  Psychiatric/Behavioral: Denies suicidal ideation, mood changes, confusion, nervousness, sleep disturbance and agitation    Physical Exam: Vitals:   01/03/21 0959  BP: 110/80  Pulse: 73  Temp: 98 F (36.7 C)  TempSrc: Oral  SpO2: 98%  Weight: (!) 309 lb 4.8 oz (140.3 kg)  Height: 5\' 7"  (1.702 m)    Body mass index is 48.44 kg/m.   Constitutional: NAD, calm, comfortable, obese Eyes: PERRL, lids and conjunctivae normal ENMT: Mucous membranes are moist. Posterior pharynx clear of any exudate or lesions. Normal dentition. Tympanic membrane is pearly white,  no erythema or bulging. Neck: normal, supple, no masses, no thyromegaly Respiratory: clear to auscultation bilaterally, no wheezing, no crackles. Normal respiratory effort. No accessory muscle use.  Cardiovascular: Regular rate and rhythm, no murmurs  / rubs / gallops. No extremity edema. 2+ pedal pulses. No carotid bruits.  Abdomen: no tenderness, no masses palpated. No hepatosplenomegaly. Bowel sounds positive.  Musculoskeletal: no clubbing / cyanosis. No joint deformity upper and lower extremities. Good ROM, no contractures. Normal muscle tone.  Skin: no rashes, lesions, ulcers. No induration Neurologic: CN 2-12 grossly intact. Sensation intact, DTR normal. Strength 5/5 in all 4.  Psychiatric: Normal judgment and insight. Alert and oriented x 3. Normal mood.    Impression and Plan:  Encounter for preventive health examination -Recommend routine eye and dental care. -Immunizations: Flu vaccine in office today, she will get COVID booster at pharmacy, otherwise immunizations are up-to-date. -Healthy lifestyle discussed in detail. -Labs to be updated today. -Colon cancer screening: Commence age 44 -Breast cancer screening: Commence age 65 -Cervical cancer screening: Negative Pap smear in 2021 -Lung cancer screening: Not applicable -Prostate cancer screening: Not applicable -DEXA: Not applicable  Needs flu shot  - Plan: Flu Vaccine QUAD 6+ mos PF IM (Fluarix Quad PF)  Mixed hyperlipidemia -Check lipids today, she is currently on atorvastatin 40 mg daily.  Last lipid panel in November 2021 with a total cholesterol of 247, triglycerides 122 and LDL 172.  Multiple sclerosis (HCC)  Optic neuritis -Followed by neurology, will refer to ophthalmology.  Morbid obesity (HCC) -Discussed healthy lifestyle, including increased physical activity and better food choices to promote weight loss.     Patient Instructions  -Nice seeing you today!!  -Lab work today; will notify you once results are available.  -Remember your COVID booster.  -Flu vaccine today.  -Remember to schedule your eye and dental exams.  -Schedule follow up in 6 months.     Chaya Jan, MD [facility]

## 2021-01-03 NOTE — Patient Instructions (Signed)
-  Nice seeing you today!!  -Lab work today; will notify you once results are available.  -Remember your COVID booster.  -Flu vaccine today.  -Remember to schedule your eye and dental exams.  -Schedule follow up in 6 months.

## 2021-01-04 ENCOUNTER — Encounter: Payer: Self-pay | Admitting: Internal Medicine

## 2021-01-04 ENCOUNTER — Other Ambulatory Visit: Payer: Self-pay | Admitting: Internal Medicine

## 2021-01-04 DIAGNOSIS — R7401 Elevation of levels of liver transaminase levels: Secondary | ICD-10-CM | POA: Insufficient documentation

## 2021-01-04 DIAGNOSIS — E559 Vitamin D deficiency, unspecified: Secondary | ICD-10-CM

## 2021-01-04 MED ORDER — VITAMIN D (ERGOCALCIFEROL) 1.25 MG (50000 UNIT) PO CAPS: 50000.0000 [IU] | ORAL_CAPSULE | ORAL | 0 refills | Status: AC

## 2021-01-05 ENCOUNTER — Other Ambulatory Visit: Payer: Self-pay | Admitting: Internal Medicine

## 2021-01-05 DIAGNOSIS — R7989 Other specified abnormal findings of blood chemistry: Secondary | ICD-10-CM

## 2021-01-05 DIAGNOSIS — E559 Vitamin D deficiency, unspecified: Secondary | ICD-10-CM

## 2021-01-13 ENCOUNTER — Encounter: Payer: Self-pay | Admitting: Internal Medicine

## 2021-01-21 ENCOUNTER — Emergency Department (HOSPITAL_BASED_OUTPATIENT_CLINIC_OR_DEPARTMENT_OTHER)
Admission: EM | Admit: 2021-01-21 | Discharge: 2021-01-21 | Disposition: A | Discharge status: Home / Self Care | Payer: BC Managed Care – PPO | Attending: Emergency Medicine | Admitting: Emergency Medicine

## 2021-01-21 ENCOUNTER — Emergency Department (HOSPITAL_BASED_OUTPATIENT_CLINIC_OR_DEPARTMENT_OTHER): Payer: BC Managed Care – PPO

## 2021-01-21 ENCOUNTER — Encounter (HOSPITAL_BASED_OUTPATIENT_CLINIC_OR_DEPARTMENT_OTHER): Payer: Self-pay | Admitting: *Deleted

## 2021-01-21 ENCOUNTER — Other Ambulatory Visit: Payer: Self-pay

## 2021-01-21 DIAGNOSIS — I10 Essential (primary) hypertension: Secondary | ICD-10-CM | POA: Diagnosis not present

## 2021-01-21 DIAGNOSIS — R519 Headache, unspecified: Secondary | ICD-10-CM | POA: Insufficient documentation

## 2021-01-21 MED ORDER — BUTALBITAL-APAP-CAFFEINE 50-325-40 MG PO TABS: 1.0000 | ORAL_TABLET | Freq: Four times a day (QID) | ORAL | 0 refills | Status: DC | PRN

## 2021-01-21 NOTE — ED Triage Notes (Signed)
Pt reports she took her BP at home this morning 159/106- she took it because she had a headache. Not prescribed meds for BP

## 2021-01-21 NOTE — ED Provider Notes (Signed)
[facility] EMERGENCY DEPT Provider Note   CSN: [id number] Arrival date & time: 01/21/21  0426     History Chief Complaint  Patient presents with   Headache    Amanda Wise is a 35 y.o. female.   Headache Pain location:  Generalized Quality:  Sharp Radiates to:  Does not radiate Severity currently:  6/10 Severity at highest:  6/10 Onset quality:  Sudden Duration: 2 minutes. 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   Myokymia 04/16/2019   Attention deficit 10/02/2018   Other fatigue 10/02/2018   Morbid obesity (HCC) 07/17/2018   High risk medication use 02/19/2018   OSA (obstructive sleep apnea) 02/19/2018   Vitamin D deficiency 02/19/2018   Urinary urgency 02/19/2018   Numbness    Multiple sclerosis (HCC) 01/30/2018   Labor and delivery indication for care or intervention 08/07/2016   Spontaneous vaginal delivery 08/07/2016   Positive GBS test 03/06/2015   Vaginal delivery 03/06/2015   Antepartum non-reassuring fetal heart rate or rhythm affecting care of mother 03/04/2015   Status post fall 03/03/2015   Abnormal antenatal test 03/03/2015    Past Surgical History:  Procedure Laterality Date   NO PAST SURGERIES       OB History     Gravida  2   Para  2   Term  2   Preterm      AB      Living  2      SAB      IAB      Ectopic      Multiple  0   Live Births  2           Family History  Problem Relation Age of Onset    Hypertension Other    Hypertension Maternal Grandmother    Hypertension Paternal Grandmother    Diabetes Father    High blood pressure Father     Social History   Tobacco Use   Smoking status: Never   Smokeless tobacco: Never  Substance Use Topics   Alcohol use: No   Drug use: No    Home Medications Prior to Admission medications   Medication Sig Start Date End Date Taking? Authorizing Provider  butalbital-acetaminophen-caffeine (FIORICET) 50-325-40 MG tablet Take 1-2 tablets by mouth every 6 (six) hours as needed for headache. 01/21/21 01/21/22 Yes Abdulwahab Demelo, Barbara Cower, MD  amphetamine-dextroamphetamine (ADDERALL) 10 MG tablet Take one pill po bid. 09/01/20   Sater, Pearletha Furl, MD  Armodafinil 200 MG TABS One po qAM 03/03/20   Sater, Pearletha Furl, MD  atorvastatin (LIPITOR) 40 MG tablet Take 1 tablet (40 mg total) by mouth daily. 01/12/20   Philip Aspen, Limmie Patricia, MD  Siponimod Fumarate (MAYZENT) 2 MG TABS Take 1 tablet by mouth daily. Mayzent 09/14/20   Sater, Pearletha Furl, MD  Vitamin D, Ergocalciferol, (DRISDOL) 1.25 MG (50000 UNIT) CAPS capsule Take 1 capsule (50,000 Units total) by mouth every 7 (seven) days for  12 doses. 01/04/21 03/23/21  Henderson Cloud, MD    Allergies    Patient has no known allergies.  Review of Systems   Review of Systems  Neurological:  Positive for headaches.  All other systems reviewed and are negative.  Physical Exam Updated Vital Signs BP 133/86    Pulse 77    Temp 98.4 F (36.9 C) (Oral)    Resp 18    Ht 5\' 7"  (1.702 m)    Wt (!) 140.2 kg    SpO2 100%    BMI 48.40 kg/m   Physical Exam Vitals and nursing note reviewed.  Constitutional:      Appearance: She is well-developed.  HENT:     Head: Normocephalic and atraumatic.  Cardiovascular:     Rate and Rhythm: Normal rate and regular rhythm.  Pulmonary:     Effort: No respiratory distress.     Breath sounds: No stridor.  Abdominal:     General: There is no distension.     Palpations:  Abdomen is soft.     Tenderness: There is no abdominal tenderness.  Musculoskeletal:        General: No swelling or tenderness. Normal range of motion.     Cervical back: Normal range of motion.  Skin:    General: Skin is warm and dry.  Neurological:     Mental Status: She is alert and oriented to person, place, and time. Mental status is at baseline.     GCS: GCS eye subscore is 4. GCS verbal subscore is 5. GCS motor subscore is 6.     Cranial Nerves: No cranial nerve deficit or dysarthria.     Sensory: No sensory deficit.     Motor: No weakness.     Coordination: Romberg sign negative.     Gait: Gait normal.    ED Results / Procedures / Treatments   Labs (all labs ordered are listed, but only abnormal results are displayed) Labs Reviewed - No data to display  EKG None  Radiology CT Head Wo Contrast  Result Date: 01/21/2021 CLINICAL DATA:  Worsening headache with hypertension. EXAM: CT HEAD WITHOUT CONTRAST TECHNIQUE: Contiguous axial images were obtained from the base of the skull through the vertex without intravenous contrast. COMPARISON:  None. FINDINGS: Brain: No evidence of acute infarction, hemorrhage, hydrocephalus, extra-axial collection or mass lesion/mass effect. Vascular: No hyperdense vessel or unexpected calcification. Skull: Normal. Negative for fracture or focal lesion. Sinuses/Orbits: There is mild membrane thickening in the ethmoid air cells, right maxillary sinus. Other visible sinuses and mastoid air cells are clear. Other: There benign dural calcifications along side of the falx at the vertex. IMPRESSION: No acute intracranial CT findings. Electronically Signed   By: 01/23/2021 M.D.   On: 01/21/2021 06:00    Procedures Procedures   Medications Ordered in ED Medications - No data to display  ED Course  I have reviewed the triage vital signs and the nursing notes.  Pertinent labs & imaging results that were available during my care of the patient were  reviewed by me and considered in my medical decision making (see chart for details).    MDM Rules/Calculators/A&P                         Headache w/ htn. Doubt MS flare. CT w/o evidence of hemorrhage or other complication. No neuro symptoms. No fever or infectious symptoms to suggest meningitis.    Final Clinical Impression(s) /  ED Diagnoses Final diagnoses:  Nonintractable headache, unspecified chronicity pattern, unspecified headache type    Rx / DC Orders ED Discharge Orders          Ordered    butalbital-acetaminophen-caffeine (FIORICET) 50-325-40 MG tablet  Every 6 hours PRN        01/21/21 0604             Landrey Mahurin, Barbara Cower, MD 01/21/21 [phone]

## 2021-01-25 ENCOUNTER — Ambulatory Visit: Payer: BC Managed Care – PPO | Admitting: Internal Medicine

## 2021-02-16 ENCOUNTER — Other Ambulatory Visit (INDEPENDENT_AMBULATORY_CARE_PROVIDER_SITE_OTHER): Payer: BC Managed Care – PPO

## 2021-02-16 DIAGNOSIS — R7989 Other specified abnormal findings of blood chemistry: Secondary | ICD-10-CM

## 2021-02-16 LAB — COMPLETE METABOLIC PANEL WITH GFR
AG Ratio: 1.4 (calc) (ref 1.0–2.5)
ALT: 27 U/L (ref 6–29)
AST: 18 U/L (ref 10–30)
Albumin: 3.9 g/dL (ref 3.6–5.1)
Alkaline phosphatase (APISO): 52 U/L (ref 31–125)
BUN/Creatinine Ratio: 9 (calc) (ref 6–22)
BUN: 10 mg/dL (ref 7–25)
CO2: 28 mmol/L (ref 20–32)
Calcium: 9 mg/dL (ref 8.6–10.2)
Chloride: 103 mmol/L (ref 98–110)
Creat: 1.06 mg/dL — ABNORMAL HIGH (ref 0.50–0.97)
Globulin: 2.8 g/dL (calc) (ref 1.9–3.7)
Glucose, Bld: 79 mg/dL (ref 65–99)
Potassium: 4.1 mmol/L (ref 3.5–5.3)
Sodium: 138 mmol/L (ref 135–146)
Total Bilirubin: 0.4 mg/dL (ref 0.2–1.2)
Total Protein: 6.7 g/dL (ref 6.1–8.1)
eGFR: 69 mL/min/{1.73_m2} (ref >=60)

## 2021-02-20 ENCOUNTER — Telehealth: Payer: Self-pay | Admitting: Neurology

## 2021-02-20 ENCOUNTER — Other Ambulatory Visit: Payer: Self-pay

## 2021-02-20 DIAGNOSIS — G35 Multiple sclerosis: Secondary | ICD-10-CM

## 2021-02-20 MED ORDER — MAYZENT 2 MG PO TABS: 1.0000 | ORAL_TABLET | Freq: Every day | ORAL | 3 refills | Status: DC

## 2021-02-20 NOTE — Telephone Encounter (Signed)
Rx e-scribed to pharmacy after this message was left. This has been handled.

## 2021-02-20 NOTE — Telephone Encounter (Signed)
At 1:44pm [facility] tech Onalee Hua called re: 2nd attempt made re: a refill of pt's: Siponimod Fumarate (MAYZENT) 2 MG TABS.  Their call # for a verbal order is [phone] they accept [place] and faxing is an option fax# [phone]

## 2021-03-08 ENCOUNTER — Encounter: Payer: Self-pay | Admitting: Neurology

## 2021-03-08 ENCOUNTER — Ambulatory Visit (INDEPENDENT_AMBULATORY_CARE_PROVIDER_SITE_OTHER): Payer: BC Managed Care – PPO | Admitting: Neurology

## 2021-03-08 VITALS — BP 138/94 | HR 89 | Ht 67.0 in | Wt 306.0 lb

## 2021-03-08 DIAGNOSIS — Z79899 Other long term (current) drug therapy: Secondary | ICD-10-CM | POA: Diagnosis not present

## 2021-03-08 DIAGNOSIS — R5383 Other fatigue: Secondary | ICD-10-CM

## 2021-03-08 DIAGNOSIS — R269 Unspecified abnormalities of gait and mobility: Secondary | ICD-10-CM | POA: Diagnosis not present

## 2021-03-08 DIAGNOSIS — G4733 Obstructive sleep apnea (adult) (pediatric): Secondary | ICD-10-CM

## 2021-03-08 DIAGNOSIS — H469 Unspecified optic neuritis: Secondary | ICD-10-CM

## 2021-03-08 DIAGNOSIS — G35 Multiple sclerosis: Secondary | ICD-10-CM | POA: Diagnosis not present

## 2021-03-08 NOTE — Progress Notes (Signed)
[facility]  PATIENT: Amanda Wise DOB: 01-20-1986  REFERRING DOCTOR OR PCP: Ritta Slot SOURCE: Patient, notes from emergency room and hospital admission, laboratory reports, imaging reports, MRI images personally reviewed.  _________________________________   HISTORICAL  CHIEF COMPLAINT:  Chief Complaint  Patient presents with   Follow-up    Rm 1, alone. Here for 6 month MS f/u, on Mayzent and tolerating well. Pt reports fatigue and cognitive has been in a fog. Pt reports numbness on R big toe, last 1.5 years.    HISTORY OF PRESENT ILLNESS:  Amanda Wise is a 36 y.o. woman who was diagnosed with multiple sclerosis in December 2019.  Update 09/01/2020: She has been on siponimod and she tolerates it well. No exacerbation since her last visit       Last MRI was April 2021 showed no new MS lesions.    She is waling well and could easily do a couple miles at the same pace as others.   Strength is doing well.  She has right big toe numbnes/pain.   Balance is doing well.   Does not need to use the bannister.      Right facial myokymia resolved.      Vision is baseline and colors are now symmetric.   Her bladder is doing well.    Fatigue occurs some days..   Heat makes this wore  She has reduced sleep due to third shift..     She has fairly mild cognitive issues - forgetful, reduced focus/attention and some word finding issues..   She stopped the dderall was stopped due to increased BP.    She has OSA, (mild overall but severe during REM).  She felt much better on CPAP but insurance did not cover it well.   Due to the expense she opted not to stay  on CPAP.    No depression but she has some  EPWORTH SLEEPINESS SCALE  On a scale of 0 - 3 what is the chance of dozing:  Sitting and Reading:   2 Watching TV:    3 Sitting inactive in a public place: 1 Passenger in car for one hour: 3 Lying down to rest in the afternoon: 2 Sitting and talking to  someone: 0 Sitting quietly after lunch:  1 In a car, stopped in traffic:  0  Total (out of 24):   12/24   Mild EDS  Vit D was low (23) and she is on 50000 U weekly.    Lymphocytes were 0.5 01/03/2021.   ALT = 51 (mildly high)ut AST = 30 (normal)   MS HISTORY: She had an episode of bilateral leg weakness in 2012, she improved over 4-5 months.  She felt symptoms were due to working out.  In 2016, she had the onset of poor balance x 3-6 months that completely resolved.    She never sought out medical/neurologic care.  In August 2019, she had some weakness and numbness in her legs for a couple weeks but then improved after she saw a Land.    In December 2019,  she had the onset of tingling in both hands and right hand weakness.   She went to the ED.  She had MRI of the brain and MRI of the cervical spine and they were consistent with MS.  She was admitted and received 5 days of IV Solu-Medrol.   She felt better by her discharge, closer to her baseline.   She went on Tysabri initially, but  due to cost switched to a siponimod drug study in 2022.   She has been on siponimod/Mayzent since January 2021     IMAGING: MRI of the brain and cervical spine dated 01/30/2018.  The brain shows multiple T2/flair hyperintense foci in the hemispheres in the periventricular, juxtacortical and deep white matter.  There are 2 foci peripherally to the right in the pons and another focus in the left middle cerebellar peduncle.  The one pontine focus enhances.  2 small foci in the hemispheres enhance.  The MRI of the cervical spine shows several foci in the spinal cord including 1 at C3 to the left and 1 at C6-C7 on the right that enhance.    The other foci located at C2-C3 posteriorly and C3 posteriorly to the right.  MRI of the brain 05/23/2019 showed multiple T2/FLAIR hyperintense foci in the brainstem, cerebellum and cerebral hemispheres in a pattern and configuration consistent with chronic demyelinating plaque  associated with multiple sclerosis. None of the foci appear to be acute. Compared to the MRI from 01/30/2018, there are no new lesions. Several foci that were enhancing in 2019 no longer do so.   Other studies: Polysomnography 04/23/2018 showed mild overall AHI (10.6) that was severe during REM sleep (REM AHI equals 37.9).  There is no significant restless leg syndrome.   She tolerated CPAP (APAP) but had high copays.     REVIEW OF SYSTEMS: Constitutional: No fevers, chills, sweats, or change in appetite Eyes: No visual changes, double vision, eye pain Ear, nose and throat: No hearing loss, ear pain, nasal congestion, sore throat Cardiovascular: No chest pain, palpitations Respiratory:  No shortness of breath at rest or with exertion.   No wheezes GastrointestinaI: No nausea, vomiting, diarrhea, abdominal pain, fecal incontinence Genitourinary:  No dysuria, urinary retention or frequency.  No nocturia. Musculoskeletal:  No neck pain, back pain Integumentary: No rash, pruritus, skin lesions Neurological: as above Psychiatric: No depression at this time.  No anxiety Endocrine: No palpitations, diaphoresis, change in appetite, change in weigh or increased thirst Hematologic/Lymphatic:  No anemia, purpura, petechiae. Allergic/Immunologic: No itchy/runny eyes, nasal congestion, recent allergic reactions, rashes  ALLERGIES: No Known Allergies  HOME MEDICATIONS:  Current Outpatient Medications:    Siponimod Fumarate (MAYZENT) 2 MG TABS, Take 1 tablet by mouth daily., Disp: 90 tablet, Rfl: 3   Vitamin D, Ergocalciferol, (DRISDOL) 1.25 MG (50000 UNIT) CAPS capsule, Take 1 capsule (50,000 Units total) by mouth every 7 (seven) days for 12 doses., Disp: 12 capsule, Rfl: 0  PAST MEDICAL HISTORY: Past Medical History:  Diagnosis Date   Anemia    Heart palpitations    Hypertension    MS (multiple sclerosis) (HCC)    Pregnancy induced hypertension     PAST SURGICAL HISTORY: Past Surgical  History:  Procedure Laterality Date   NO PAST SURGERIES      FAMILY HISTORY: Family History  Problem Relation Age of Onset   Hypertension Other    Hypertension Maternal Grandmother    Hypertension Paternal Grandmother    Diabetes Father    High blood pressure Father     SOCIAL HISTORY:  Social History   Socioeconomic History   Marital status: Single    Spouse name: Not on file   Number of children: 2   Years of education: 14   Highest education level: Associate degree: occupational, Scientist, product/process development, or vocational program  Occupational History   Occupation: Orthoptist  Tobacco Use   Smoking status: Never   Smokeless tobacco:  Never  Substance and Sexual Activity   Alcohol use: No   Drug use: No   Sexual activity: Yes    Birth control/protection: None  Other Topics Concern   Not on file  Social History Narrative   Lives with family   Caffeine use: soda sometimes   Right handed    Social Determinants of Health   Financial Resource Strain: Medium Risk   Difficulty of Paying Living Expenses: Somewhat hard  Food Insecurity: No Food Insecurity   Worried About Programme researcher, broadcasting/film/video in the Last Year: Never true   Ran Out of Food in the Last Year: Never true  Transportation Needs: Not on file  Physical Activity: Insufficiently Active   Days of Exercise per Week: 3 days   Minutes of Exercise per Session: 30 min  Stress: Stress Concern Present   Feeling of Stress : Very much  Social Connections: Moderately Isolated   Frequency of Communication with Friends and Family: More than three times a week   Frequency of Social Gatherings with Friends and Family: Once a week   Attends Religious Services: Never   Database administrator or Organizations: No   Attends Engineer, structural: Not on file   Marital Status: Living with partner  Intimate Partner Violence: Not on file     PHYSICAL EXAM  Vitals:   03/08/21 0841  BP: (!) 138/94  Pulse: 89  SpO2: 98%   Weight: (!) 306 lb (138.8 kg)  Height: 5\' 7"  (1.702 m)     Body mass index is 47.93 kg/m.  No results found.   General: The patient is well-developed and well-nourished and in no acute distress.  Abdomen is nontender with normal sounds.  Eyes:  Funduscopic exam shows normal optic discs and retinal vessels.   Skin: Extremities are without rash or edema.   Neurologic Exam  Mental status: The patient is alert and oriented x 3 at the time of the examination. The patient has apparent normal recent and remote memory, with an apparently normal attention span and concentration ability.   Speech is normal.  Cranial nerves: Extraocular movements are full.  She has a mild right APD.  Color vision was symmetric, however.  Facial strength and sensation was normal.  There was no myokymia.  Hearing appears normal and symmetric  Motor:  Muscle bulk is normal.   Tone is normal. Strength is  5 / 5 in all 4 extremities.   Sensory: She has intact sensation to touch and vibration in the arms and legs.  Coordination: Cerebellar testing reveals good finger-nose-finger and heel-to-shin bilaterally.  Gait and station: Station is normal.   Gait is normal.  Tandem gait is mildly wide.  Romberg is negative.  Reflexes: Deep tendon reflexes are symmetric and normal in arms and increased in legs without clonus.       DIAGNOSTIC DATA (LABS, IMAGING, TESTING) - I reviewed patient records, labs, notes, testing and imaging myself where available.  Lab Results  Component Value Date   WBC 4.0 01/03/2021   HGB 12.5 01/03/2021   HCT 37.3 01/03/2021   MCV 89.9 01/03/2021   PLT 287.0 01/03/2021      Component Value Date/Time   NA 138 02/16/2021 0856   K 4.1 02/16/2021 0856   CL 103 02/16/2021 0856   CO2 28 02/16/2021 0856   GLUCOSE 79 02/16/2021 0856   BUN 10 02/16/2021 0856   CREATININE 1.06 (H) 02/16/2021 0856   CALCIUM 9.0 02/16/2021 0856  PROT 6.7 02/16/2021 0856   PROT 6.9 09/01/2020 1601    ALBUMIN 4.0 01/03/2021 1031   ALBUMIN 4.1 09/01/2020 1601   AST 18 02/16/2021 0856   ALT 27 02/16/2021 0856   ALKPHOS 73 01/03/2021 1031   BILITOT 0.4 02/16/2021 0856   BILITOT 0.3 09/01/2020 1601   GFRNONAA >60 05/03/2020 2125   GFRAA >60 12/22/2018 0240     ____________________________________________________________   1. Multiple sclerosis (HCC)   2. Neurologic gait dysfunction   3. OSA (obstructive sleep apnea)   4. High risk medication use   5. Other fatigue   6. Optic neuritis       1.    Continue Mayzent.   We will check MRI of the brain and cervical spine to determine if there has been subclinical progression.  If this is occurring, I would recommend that she go back on Tysabri we will consider an anti-CD20 agent.    2.    Stay active and exercise as tolerated. 3.    Adderall helps the fatigue but because elevated blood pressure.  Therefore she stopped..  We also discussed reconsideration of CPAP (she tolerated but insurance had high copays).  .  We also discussed weight loss. 4.    rtc 6 months or sooner if new or worsening neurologic issues.  Oktober Glazer A. Epimenio Foot, MD, [facility] 03/08/2021, 9:10 AM Certified in Neurology, Clinical Neurophysiology, Sleep Medicine, Pain Medicine and Neuroimaging  [facility] [address], [address] [place], [place] [address] [phone]

## 2021-03-09 ENCOUNTER — Telehealth: Payer: Self-pay | Admitting: Neurology

## 2021-03-09 NOTE — Telephone Encounter (Signed)
[employer] Berkley Harvey: [id number] (exp. 03/09/21 to 04/07/21) order sent to GI, theyw ill reach out to the patient to schedule.

## 2021-03-24 ENCOUNTER — Ambulatory Visit
Admission: RE | Admit: 2021-03-24 | Discharge: 2021-03-24 | Disposition: A | Discharge status: Home / Self Care | Payer: BC Managed Care – PPO | Source: Ambulatory Visit | Attending: Neurology | Admitting: Neurology

## 2021-03-24 DIAGNOSIS — G35 Multiple sclerosis: Secondary | ICD-10-CM | POA: Diagnosis not present

## 2021-03-24 DIAGNOSIS — R269 Unspecified abnormalities of gait and mobility: Secondary | ICD-10-CM

## 2021-03-24 MED ORDER — GADOBENATE DIMEGLUMINE 529 MG/ML IV SOLN
20.0000 mL | Freq: Once | INTRAVENOUS | Status: AC | PRN
  Administered 2021-03-24: 20 mL via INTRAVENOUS

## 2021-03-27 ENCOUNTER — Telehealth: Payer: Self-pay | Admitting: Neurology

## 2021-03-27 NOTE — Telephone Encounter (Signed)
I called to go over the results of the MRIs (breakthrough activity) I got voicemail and left a message that I will try to reach her later.  We will need to consider a different disease modifying therapy.   MRI of the brain 03/24/2021 showed T2/FLAIR hyperintense foci in the brainstem, cervical cerebellum, spinal cord and cerebral hemispheres in a pattern consistent with demyelinating plaque associated with multiple sclerosis.  1 focus in the left parietal lobe enhances consistent with an acute demyelinating plaque.  Additionally, there are 4 chronic plaques in the hemispheres seen on the current MRI that were not present on the MRI from 05/23/2019 consistent with plaques developing during the interim.   MRI of the cervical spine 03/24/2021 showed Four T2 hyperintense foci within the spinal cord adjacent to C2-C3, C3-C4, C4 and C6-C7.  The foci at C2-C3, C4 C6-C7 were clearly present on the previous MRI and the C2-C3 and C6-C7 foci were larger and enhanced at that time.  The focus adjacent to C3-C4 posterolaterally to the right was not clearly present on the previous MRI and could represent interval development of an additional plaque.  1 focus adjacent to C4-C5 to the right on the previous MRI was not clearly present on the current MRI.    Also, at C5-C6 there is a disc protrusion and mild left uncovertebral spurring causing mild spinal stenosis and mild to moderate left foraminal narrowing but no nerve root compression.  This is essentially unchanged compared to the previous MRI.

## 2021-03-28 NOTE — Telephone Encounter (Addendum)
LVM for pt to call office to schedule appt. I also called mother (on [place]). LVM for her or pt to call office to make appt

## 2021-03-28 NOTE — Telephone Encounter (Signed)
I spoke to the patient about the MRI results showing breakthrough activity.  She does state that she has not always been compliant with her meds and missing a good number of doses at times.  Therefore, I think she would be better off on a medication like Mavenclad or Ocrevus.  That way she would not need to take the medicine on a daily basis to get extended benefit.   Please call her to schedule an appointment to see me sometime in the next 2 - 3 weeks.

## 2021-03-29 NOTE — Telephone Encounter (Signed)
Phone went straight to voice mail. I left a message that we have this spot held for her and to call us back if they can take it.

## 2021-03-29 NOTE — Telephone Encounter (Signed)
Hey, can you please try calling pt/mother again to offer 04/05/21 at 11:30pm? I have not heard back yet. Thank you!

## 2021-04-10 ENCOUNTER — Other Ambulatory Visit (INDEPENDENT_AMBULATORY_CARE_PROVIDER_SITE_OTHER): Payer: BC Managed Care – PPO

## 2021-04-10 DIAGNOSIS — E559 Vitamin D deficiency, unspecified: Secondary | ICD-10-CM | POA: Diagnosis not present

## 2021-04-10 LAB — VITAMIN D 25 HYDROXY (VIT D DEFICIENCY, FRACTURES): VITD: 37.54 ng/mL (ref 30.00–100.00)

## 2021-04-12 ENCOUNTER — Encounter: Payer: Self-pay | Admitting: Internal Medicine

## 2021-04-12 ENCOUNTER — Telehealth (INDEPENDENT_AMBULATORY_CARE_PROVIDER_SITE_OTHER): Payer: BC Managed Care – PPO | Admitting: Internal Medicine

## 2021-04-12 VITALS — BP 120/84 | HR 85 | Temp 97.9°F | Wt 296.9 lb

## 2021-04-12 DIAGNOSIS — J029 Acute pharyngitis, unspecified: Secondary | ICD-10-CM | POA: Diagnosis not present

## 2021-04-12 DIAGNOSIS — R0981 Nasal congestion: Secondary | ICD-10-CM | POA: Diagnosis not present

## 2021-04-12 DIAGNOSIS — J020 Streptococcal pharyngitis: Secondary | ICD-10-CM | POA: Diagnosis not present

## 2021-04-12 LAB — POCT RAPID STREP A (OFFICE)
Rapid Strep A Screen: NEGATIVE
Rapid Strep A Screen: POSITIVE — AB

## 2021-04-12 LAB — POCT INFLUENZA A/B
Influenza A, POC: NEGATIVE
Influenza B, POC: NEGATIVE

## 2021-04-12 LAB — POC COVID19 BINAXNOW: SARS Coronavirus 2 Ag: NEGATIVE

## 2021-04-12 MED ORDER — AMOXICILLIN 500 MG PO TABS: 500.0000 mg | ORAL_TABLET | Freq: Three times a day (TID) | ORAL | 0 refills | Status: AC

## 2021-04-12 NOTE — Progress Notes (Signed)
? ? ? ?Established Patient Office Visit ? ? ? ? ?This visit occurred during the SARS-CoV-2 public health emergency.  Safety protocols were in place, including screening questions prior to the visit, additional usage of staff PPE, and extensive cleaning of exam room while observing appropriate contact time as indicated for disinfecting solutions.  ? ? ?CC/Reason for Visit: Sore throat, cough, nausea ? ?HPI: Amanda Wise is a 36 y.o. female who is coming in today for the above mentioned reasons.  For the past 4 days she has been having subjective fever and chills coupled with a significant sore throat.  Yesterday she vomited x1 and has been having some nausea, she has some other URI symptoms such as cough and congestion. ? ?Past Medical/Surgical History: ?Past Medical History:  ?Diagnosis Date  ? Anemia   ? Heart palpitations   ? Hypertension   ? MS (multiple sclerosis) (HCC)   ? Pregnancy induced hypertension   ? ? ?Past Surgical History:  ?Procedure Laterality Date  ? NO PAST SURGERIES    ? ? ?Social History: ? reports that she has never smoked. She has never used smokeless tobacco. She reports that she does not drink alcohol and does not use drugs. ? ?Allergies: ?No Known Allergies ? ?Family History:  ?Family History  ?Problem Relation Age of Onset  ? Hypertension Other   ? Hypertension Maternal Grandmother   ? Hypertension Paternal Grandmother   ? Diabetes Father   ? High blood pressure Father   ? ? ? ?Current Outpatient Medications:  ?  amoxicillin (AMOXIL) 500 MG tablet, Take 1 tablet (500 mg total) by mouth in the morning, at noon, and at bedtime for 10 days., Disp: 30 tablet, Rfl: 0 ?  Siponimod Fumarate (MAYZENT) 2 MG TABS, Take 1 tablet by mouth daily., Disp: 90 tablet, Rfl: 3 ? ?Review of Systems:  ?Constitutional: Positive for fever, chills, diaphoresis, appetite change and fatigue.  ?HEENT: Denies photophobia, eye pain, redness,  trouble swallowing, neck pain, neck stiffness and tinnitus.    ?Respiratory: Denies SOB, DOE, chest tightness,  and wheezing.   ?Cardiovascular: Denies chest pain, palpitations and leg swelling.  ?Gastrointestinal: Denies nausea, vomiting, abdominal pain, diarrhea, constipation, blood in stool and abdominal distention.  ?Genitourinary: Denies dysuria, urgency, frequency, hematuria, flank pain and difficulty urinating.  ?Endocrine: Denies: hot or cold intolerance, sweats, changes in hair or nails, polyuria, polydipsia. ?Musculoskeletal: Denies myalgias, back pain, joint swelling, arthralgias and gait problem.  ?Skin: Denies pallor, rash and wound.  ?Neurological: Denies dizziness, seizures, syncope, weakness, light-headedness, numbness and headaches.  ?Hematological: Denies adenopathy. Easy bruising, personal or family bleeding history  ?Psychiatric/Behavioral: Denies suicidal ideation, mood changes, confusion, nervousness, sleep disturbance and agitation ? ? ? ?Physical Exam: ?Vitals:  ? 04/12/21 1418  ?BP: 120/84  ?Pulse: 85  ?Temp: 97.9 ?F (36.6 ?C)  ?TempSrc: Oral  ?SpO2: 97%  ?Weight: 296 lb 14.4 oz (134.7 kg)  ? ? ?Body mass index is 46.5 kg/m?. ? ? ?Constitutional: NAD, calm, comfortable ?Eyes: PERRL, lids and conjunctivae normal ?ENMT: Mucous membranes are moist. Posterior pharynx is very erythematous clear of any exudate or lesions. Normal dentition. Tympanic membrane is pearly white, no erythema or bulging. ?Neck: normal, supple, no masses, no thyromegaly ?Respiratory: clear to auscultation bilaterally, no wheezing, no crackles. Normal respiratory effort. No accessory muscle use.  ?Cardiovascular: Regular rate and rhythm, no murmurs / rubs / gallops. No extremity edema.  ?Psychiatric: Normal judgment and insight. Alert and oriented x 3. Normal mood.  ? ? ?  Impression and Plan: ? ?Sore throat - Plan: POCT Influenza A/B, POCT rapid strep A, POC COVID-19, POC Rapid Strep A, CANCELED: POC Rapid Strep A ? ?Nasal congestion - Plan: POCT Influenza A/B, POC COVID-19 ? ?Strep  pharyngitis - Plan: amoxicillin (AMOXIL) 500 MG tablet ? ?-In office flu and COVID test are negative, strep throat is positive.  She will be treated with amoxicillin for 10 days, she will be provided with work note for 2 days. ? ?Time spent: 30 minutes reviewing chart, interviewing and examining patient and formulating plan care. ? ? ? ? ?Chaya Jan, MD ?[facility] ? ? ?

## 2021-04-17 ENCOUNTER — Telehealth: Payer: Self-pay | Admitting: Internal Medicine

## 2021-04-17 NOTE — Telephone Encounter (Signed)
Placed in Dr Hernandez's folder 

## 2021-04-17 NOTE — Telephone Encounter (Signed)
FMLA forms to be filled out--placed in dr's folder. ?

## 2021-04-18 NOTE — Telephone Encounter (Signed)
Form ready and patient is aware ?

## 2021-04-28 ENCOUNTER — Encounter: Payer: Self-pay | Admitting: Internal Medicine

## 2021-04-28 NOTE — Telephone Encounter (Signed)
In person appt scheduled mar 27.  Last VV for same issue on 04/12/21. ?

## 2021-05-01 ENCOUNTER — Encounter: Payer: Self-pay | Admitting: Internal Medicine

## 2021-05-01 ENCOUNTER — Ambulatory Visit (INDEPENDENT_AMBULATORY_CARE_PROVIDER_SITE_OTHER): Payer: BC Managed Care – PPO | Admitting: Internal Medicine

## 2021-05-01 VITALS — BP 120/84 | HR 75 | Temp 97.6°F | Wt 303.3 lb

## 2021-05-01 DIAGNOSIS — J029 Acute pharyngitis, unspecified: Secondary | ICD-10-CM

## 2021-05-01 NOTE — Progress Notes (Signed)
? ? ? ?  Established Patient Office Visit ? ? ? ? ?This visit occurred during the SARS-CoV-2 public health emergency.  Safety protocols were in place, including screening questions prior to the visit, additional usage of staff PPE, and extensive cleaning of exam room while observing appropriate contact time as indicated for disinfecting solutions.  ? ? ?CC/Reason for Visit: Continued sore throat ? ?HPI: Amanda Wise is a 36 y.o. female who is coming in today for the above mentioned reasons.  She had an in person visit earlier this month and was diagnosed with group A pharyngitis.  She was treated with amoxicillin.  She has noted significant improvement especially in her fevers and chills.  Her sore throat, although improved is still present.  She wonders if she needs another round of antibiotic therapy. ? ?Past Medical/Surgical History: ?Past Medical History:  ?Diagnosis Date  ? Anemia   ? Heart palpitations   ? Hypertension   ? MS (multiple sclerosis) (HCC)   ? Pregnancy induced hypertension   ? ? ?Past Surgical History:  ?Procedure Laterality Date  ? NO PAST SURGERIES    ? ? ?Social History: ? reports that she has never smoked. She has never used smokeless tobacco. She reports that she does not drink alcohol and does not use drugs. ? ?Allergies: ?No Known Allergies ? ?Family History:  ?Family History  ?Problem Relation Age of Onset  ? Hypertension Other   ? Hypertension Maternal Grandmother   ? Hypertension Paternal Grandmother   ? Diabetes Father   ? High blood pressure Father   ? ? ? ?Current Outpatient Medications:  ?  Siponimod Fumarate (MAYZENT) 2 MG TABS, Take 1 tablet by mouth daily., Disp: 90 tablet, Rfl: 3 ? ?Review of Systems:  ?Constitutional: Denies fever, chills, diaphoresis, appetite change and fatigue.  ?HEENT: Denies photophobia, eye pain, redness, hearing loss,  neck pain, neck stiffness and tinnitus.   ?Respiratory: Denies SOB, DOE, cough, chest tightness,  and wheezing.    ?Cardiovascular: Denies chest pain, palpitations and leg swelling.  ?Gastrointestinal: Denies nausea, vomiting, abdominal pain, diarrhea, constipation, blood in stool and abdominal distention.  ?Genitourinary: Denies dysuria, urgency, frequency, hematuria, flank pain and difficulty urinating.  ?Endocrine: Denies: hot or cold intolerance, sweats, changes in hair or nails, polyuria, polydipsia. ?Musculoskeletal: Denies myalgias, back pain, joint swelling, arthralgias and gait problem.  ?Skin: Denies pallor, rash and wound.  ?Neurological: Denies dizziness, seizures, syncope, weakness, light-headedness, numbness and headaches.  ?Hematological: Denies adenopathy. Easy bruising, personal or family bleeding history  ?Psychiatric/Behavioral: Denies suicidal ideation, mood changes, confusion, nervousness, sleep disturbance and agitation ? ? ? ?Physical Exam: ?Vitals:  ? 05/01/21 0855  ?BP: 120/84  ?Pulse: 75  ?Temp: 97.6 ?F (36.4 ?C)  ?TempSrc: Oral  ?SpO2: 98%  ?Weight: (!) 303 lb 4.8 oz (137.6 kg)  ? ? ?Body mass index is 47.5 kg/m?. ? ? ?Constitutional: NAD, calm, comfortable ?Eyes: PERRL, lids and conjunctivae normal ?ENMT: Mucous membranes are moist. Posterior pharynx is slightly erythematous but clear of any exudate or lesions.  ?Psychiatric: Normal judgment and insight. Alert and oriented x 3. Normal mood.  ? ? ?Impression and Plan: ? ?Sore throat ?-Do not believe further antibiotic therapy is needed, have recommended Chloraseptic lozenges or spray as needed. ? ? ?Time spent: 20 minutes reviewing chart, interviewing and examining patient and formulating plan of care. ? ? ? ? ?Chaya Jan, MD ?[facility] ? ? ?

## 2021-06-22 ENCOUNTER — Encounter: Payer: Self-pay | Admitting: Internal Medicine

## 2021-06-22 ENCOUNTER — Ambulatory Visit (INDEPENDENT_AMBULATORY_CARE_PROVIDER_SITE_OTHER): Payer: BC Managed Care – PPO | Admitting: Internal Medicine

## 2021-06-22 VITALS — BP 130/90 | HR 99 | Temp 98.0°F | Ht 67.0 in | Wt 287.9 lb

## 2021-06-22 DIAGNOSIS — R030 Elevated blood-pressure reading, without diagnosis of hypertension: Secondary | ICD-10-CM | POA: Diagnosis not present

## 2021-06-22 NOTE — Progress Notes (Signed)
Established Patient Office Visit     CC/Reason for Visit: Discuss elevated blood pressure  HPI: Amanda Wise is a 36 y.o. female who is coming in today for the above mentioned reasons.  For the past 6 days she has noted elevated blood pressure.  She is not known to be hypertensive.  She brings in ambulatory blood pressure measurements: 160/103, 145/95, 145/90.  2 in office blood pressures are 130/90 and 134/76.  She admits to a lot of familial stressors over the past week with an aunt who had a stroke and is in a coma in the hospital among other things.  Previous in office blood pressure measurements have been well within the range of normal.  She has lost 16 pounds since her last in office visit in March by doing intermittent fasting.  Past Medical/Surgical History: Past Medical History:  Diagnosis Date   Anemia    Heart palpitations    Hypertension    MS (multiple sclerosis) (HCC)    Pregnancy induced hypertension     Past Surgical History:  Procedure Laterality Date   NO PAST SURGERIES      Social History:  reports that she has never smoked. She has never used smokeless tobacco. She reports that she does not drink alcohol and does not use drugs.  Allergies: No Known Allergies  Family History:  Family History  Problem Relation Age of Onset   Hypertension Other    Hypertension Maternal Grandmother    Hypertension Paternal Grandmother    Diabetes Father    High blood pressure Father      Current Outpatient Medications:    Siponimod Fumarate (MAYZENT) 2 MG TABS, Take 1 tablet by mouth daily., Disp: 90 tablet, Rfl: 3  Review of Systems:  Constitutional: Denies fever, chills, diaphoresis, appetite change and fatigue.  HEENT: Denies photophobia, eye pain, redness, hearing loss, ear pain, congestion, sore throat, rhinorrhea, sneezing, mouth sores, trouble swallowing, neck pain, neck stiffness and tinnitus.   Respiratory: Denies SOB, DOE, cough, chest  tightness,  and wheezing.   Cardiovascular: Denies chest pain, palpitations and leg swelling.  Gastrointestinal: Denies nausea, vomiting, abdominal pain, diarrhea, constipation, blood in stool and abdominal distention.  Genitourinary: Denies dysuria, urgency, frequency, hematuria, flank pain and difficulty urinating.  Endocrine: Denies: hot or cold intolerance, sweats, changes in hair or nails, polyuria, polydipsia. Musculoskeletal: Denies myalgias, back pain, joint swelling, arthralgias and gait problem.  Skin: Denies pallor, rash and wound.  Neurological: Denies dizziness, seizures, syncope, weakness, light-headedness, numbness and headaches.  Hematological: Denies adenopathy. Easy bruising, personal or family bleeding history  Psychiatric/Behavioral: Denies suicidal ideation, mood changes, confusion, nervousness, sleep disturbance and agitation    Physical Exam: Vitals:   06/22/21 1338 06/22/21 1342  BP: 134/76 130/90  Pulse: 99   Temp: 98 F (36.7 C)   TempSrc: Oral   SpO2: 98%   Weight: 287 lb 14.4 oz (130.6 kg)   Height: 5\' 7"  (1.702 m)     Body mass index is 45.09 kg/m.   Constitutional: NAD, calm, comfortable Eyes: PERRL, lids and conjunctivae normal ENMT: Mucous membranes are moist.  Respiratory: clear to auscultation bilaterally, no wheezing, no crackles. Normal respiratory effort. No accessory muscle use.  Cardiovascular: Regular rate and rhythm, no murmurs / rubs / gallops. No extremity edema.  Psychiatric: Normal judgment and insight. Alert and oriented x 3. Normal mood.    Impression and Plan:  Elevated BP without diagnosis of hypertension -Not quite ready to make diagnosis of  hypertension yet.  I have advised her to do ambulatory blood pressure measurements 2-3 times a week and return in 6 weeks to discuss.  Morbid obesity (HCC) -Discussed healthy lifestyle, including increased physical activity and better food choices to promote weight loss. -Congratulated  on weight loss success thus far.    Time spent:21 minutes reviewing chart, interviewing and examining patient and formulating plan of care.     Chaya Jan, MD [facility]

## 2021-07-18 ENCOUNTER — Telehealth: Payer: Self-pay | Admitting: *Deleted

## 2021-07-18 NOTE — Telephone Encounter (Signed)
LVM at [phone] for pt to call office to schedule sooner f/u than 09/13/21.

## 2021-08-03 ENCOUNTER — Ambulatory Visit (INDEPENDENT_AMBULATORY_CARE_PROVIDER_SITE_OTHER): Payer: BC Managed Care – PPO | Admitting: Internal Medicine

## 2021-08-03 ENCOUNTER — Encounter: Payer: Self-pay | Admitting: Internal Medicine

## 2021-08-03 VITALS — BP 130/90 | HR 110 | Temp 97.7°F | Wt 288.8 lb

## 2021-08-03 DIAGNOSIS — I10 Essential (primary) hypertension: Secondary | ICD-10-CM | POA: Diagnosis not present

## 2021-08-03 MED ORDER — AMLODIPINE BESYLATE 5 MG PO TABS: 5.0000 mg | ORAL_TABLET | Freq: Every day | ORAL | 1 refills | Status: DC

## 2021-08-03 NOTE — Progress Notes (Signed)
Established Patient Office Visit     CC/Reason for Visit: Follow-up blood pressure  HPI: Amanda Wise is a 36 y.o. female who is coming in today for the above mentioned reasons.  She has been doing ambulatory blood pressure measurements due to an elevated blood pressure that we noticed in the office at last visit.  Her home measurements have been around 130/90.  This coincides with her office measurements.  She is feeling well.  She states that most of her family members have been diagnosed with hypertension.  Past Medical/Surgical History: Past Medical History:  Diagnosis Date   Anemia    Heart palpitations    Hypertension    MS (multiple sclerosis) (HCC)    Pregnancy induced hypertension     Past Surgical History:  Procedure Laterality Date   NO PAST SURGERIES      Social History:  reports that she has never smoked. She has never used smokeless tobacco. She reports that she does not drink alcohol and does not use drugs.  Allergies: No Known Allergies  Family History:  Family History  Problem Relation Age of Onset   Hypertension Other    Hypertension Maternal Grandmother    Hypertension Paternal Grandmother    Diabetes Father    High blood pressure Father      Current Outpatient Medications:    amLODipine (NORVASC) 5 MG tablet, Take 1 tablet (5 mg total) by mouth daily., Disp: 90 tablet, Rfl: 1   Siponimod Fumarate (MAYZENT) 2 MG TABS, Take 1 tablet by mouth daily., Disp: 90 tablet, Rfl: 3  Review of Systems:  Constitutional: Denies fever, chills, diaphoresis, appetite change and fatigue.  HEENT: Denies photophobia, eye pain, redness, hearing loss, ear pain, congestion, sore throat, rhinorrhea, sneezing, mouth sores, trouble swallowing, neck pain, neck stiffness and tinnitus.   Respiratory: Denies SOB, DOE, cough, chest tightness,  and wheezing.   Cardiovascular: Denies chest pain, palpitations and leg swelling.  Gastrointestinal: Denies nausea,  vomiting, abdominal pain, diarrhea, constipation, blood in stool and abdominal distention.  Genitourinary: Denies dysuria, urgency, frequency, hematuria, flank pain and difficulty urinating.  Endocrine: Denies: hot or cold intolerance, sweats, changes in hair or nails, polyuria, polydipsia. Musculoskeletal: Denies myalgias, back pain, joint swelling, arthralgias and gait problem.  Skin: Denies pallor, rash and wound.  Neurological: Denies dizziness, seizures, syncope, weakness, light-headedness, numbness and headaches.  Hematological: Denies adenopathy. Easy bruising, personal or family bleeding history  Psychiatric/Behavioral: Denies suicidal ideation, mood changes, confusion, nervousness, sleep disturbance and agitation    Physical Exam: Vitals:   08/03/21 1039 08/03/21 1044 08/03/21 1109  BP: 130/90 122/82 130/90  Pulse: (!) 110    Temp: 97.7 F (36.5 C)    TempSrc: Oral    SpO2: 99%    Weight: 288 lb 12.8 oz (131 kg)      Body mass index is 45.23 kg/m.   Constitutional: NAD, calm, comfortable Eyes: PERRL, lids and conjunctivae normal ENMT: Mucous membranes are moist.  Respiratory: clear to auscultation bilaterally, no wheezing, no crackles. Normal respiratory effort. No accessory muscle use.  Cardiovascular: Regular rate and rhythm, no murmurs / rubs / gallops. No extremity edema.  Neurologic: Grossly intact and nonfocal Psychiatric: Normal judgment and insight. Alert and oriented x 3. Normal mood.    Impression and Plan:  Primary hypertension  - Plan: amLODipine (NORVASC) 5 MG tablet -I will make this diagnosis today, start amlodipine 5 mg daily, she will return in 6 weeks for follow-up.    Time  spent:30 minutes reviewing chart, interviewing and examining patient and formulating plan of care.    Chaya Jan, MD  [facility] at [facility]

## 2021-08-12 DIAGNOSIS — I10 Essential (primary) hypertension: Secondary | ICD-10-CM | POA: Diagnosis not present

## 2021-08-16 ENCOUNTER — Telehealth: Payer: Self-pay | Admitting: Neurology

## 2021-08-16 NOTE — Telephone Encounter (Signed)
[email or web address] has called to report the PA has expired as of 06-30 on pt's (MAYZENT) please call

## 2021-08-16 NOTE — Telephone Encounter (Signed)
Dr. Epimenio Foot, this is the patient you wanted to get in for appt back in March d/t breakthrough activity on MRI while on Mayzent.  However, she has been unable to reach to schedule sooner f/u than her 09/13/21 appt. (She read multiple mychart messages since and has not replied) Did you want me to complete PA on Mayzent or proceed differently?

## 2021-08-17 NOTE — Telephone Encounter (Signed)
Received fax from [employer] that PA approved 08/17/21-08/18/22. Must be filled via [facility]. Fax: [phone].

## 2021-08-17 NOTE — Telephone Encounter (Signed)
Called [facility] at [phone]. Spoke w/ rep. States she has [employer].   ID: [id number] Rx BIN: [id number] PCN: [id number] Grp: [id number] Phone# [phone]  Unable to initiate PA on covermymeds w/ above info. Called number provided and spoke w/ Kyung Rudd (certified rep). She ran test claim for Mayzent 2mg  tablet. Confirmed PA needed. I completed PA over the phone. She forwarded to pharmacist for review/determination. Should receive determination by fax [phone].   She has been on Mayzent since 03/2019. Tolerating well and stable on therapy.Dx code: G10

## 2021-08-22 ENCOUNTER — Emergency Department (HOSPITAL_BASED_OUTPATIENT_CLINIC_OR_DEPARTMENT_OTHER): Payer: BC Managed Care – PPO | Admitting: Radiology

## 2021-08-22 ENCOUNTER — Encounter (HOSPITAL_BASED_OUTPATIENT_CLINIC_OR_DEPARTMENT_OTHER): Payer: Self-pay

## 2021-08-22 ENCOUNTER — Other Ambulatory Visit: Payer: Self-pay

## 2021-08-22 ENCOUNTER — Emergency Department (HOSPITAL_BASED_OUTPATIENT_CLINIC_OR_DEPARTMENT_OTHER)
Admission: EM | Admit: 2021-08-22 | Discharge: 2021-08-23 | Disposition: A | Discharge status: Home / Self Care | Payer: BC Managed Care – PPO | Attending: Emergency Medicine | Admitting: Emergency Medicine

## 2021-08-22 DIAGNOSIS — R002 Palpitations: Secondary | ICD-10-CM | POA: Insufficient documentation

## 2021-08-22 DIAGNOSIS — R079 Chest pain, unspecified: Secondary | ICD-10-CM | POA: Diagnosis not present

## 2021-08-22 DIAGNOSIS — I10 Essential (primary) hypertension: Secondary | ICD-10-CM | POA: Diagnosis not present

## 2021-08-22 DIAGNOSIS — Z79899 Other long term (current) drug therapy: Secondary | ICD-10-CM | POA: Insufficient documentation

## 2021-08-22 LAB — BASIC METABOLIC PANEL
Anion gap: 12 (ref 5–15)
BUN: 12 mg/dL (ref 6–20)
CO2: 23 mmol/L (ref 22–32)
Calcium: 9.9 mg/dL (ref 8.9–10.3)
Chloride: 104 mmol/L (ref 98–111)
Creatinine, Ser: 1.03 mg/dL — ABNORMAL HIGH (ref 0.44–1.00)
GFR, Estimated: >60 mL/min (ref >60)
Glucose, Bld: 93 mg/dL (ref 70–99)
Potassium: 3.5 mmol/L (ref 3.5–5.1)
Sodium: 139 mmol/L (ref 135–145)

## 2021-08-22 LAB — CBC
HCT: 37.2 % (ref 36.0–46.0)
Hemoglobin: 12.8 g/dL (ref 12.0–15.0)
MCH: 30.3 pg (ref 26.0–34.0)
MCHC: 34.4 g/dL (ref 30.0–36.0)
MCV: 87.9 fL (ref 80.0–100.0)
Platelets: 243 10*3/uL (ref 150–400)
RBC: 4.23 MIL/uL (ref 3.87–5.11)
RDW: 13.2 % (ref 11.5–15.5)
WBC: 3.7 10*3/uL — ABNORMAL LOW (ref 4.0–10.5)
nRBC: 0 % (ref 0.0–0.2)

## 2021-08-22 LAB — HCG, SERUM, QUALITATIVE: Preg, Serum: NEGATIVE

## 2021-08-22 LAB — TROPONIN I (HIGH SENSITIVITY)
Troponin I (High Sensitivity): 2 ng/L (ref <18)
Troponin I (High Sensitivity): 2 ng/L (ref <18)

## 2021-08-22 NOTE — ED Provider Notes (Signed)
[facility] EMERGENCY DEPT Provider Note   CSN: [id number] Arrival date & time: 08/22/21  1848     History  Chief Complaint  Patient presents with   Chest Pain    Amanda Wise is a 36 y.o. female.  Patient is a 36 year old female with past medical history of hypertension.  She presents today for evaluation of palpitations.  She reports that this has been ongoing for many months, however has been worse over the past several days.  She describes a "fluttering" in her chest that occurs intermittently.  She denies any excessive alcohol or caffeine intake.  She denies any prior cardiac history.  She denies any exertional dyspnea/chest pain.  The history is provided by the patient.       Home Medications Prior to Admission medications   Medication Sig Start Date End Date Taking? Authorizing Provider  amLODipine (NORVASC) 5 MG tablet Take 1 tablet (5 mg total) by mouth daily. 08/03/21   Philip Aspen, Limmie Patricia, MD  Siponimod Fumarate (MAYZENT) 2 MG TABS Take 1 tablet by mouth daily. 02/20/21   Sater, Pearletha Furl, MD      Allergies    Patient has no known allergies.    Review of Systems   Review of Systems  All other systems reviewed and are negative.   Physical Exam Updated Vital Signs BP 124/74   Pulse 73   Temp 98.2 F (36.8 C) (Temporal)   Resp 15   Ht 5\' 7"  (1.702 m)   Wt 131 kg   SpO2 100%   BMI 45.23 kg/m  Physical Exam Vitals and nursing note reviewed.  Constitutional:      General: She is not in acute distress.    Appearance: She is well-developed. She is not diaphoretic.  HENT:     Head: Normocephalic and atraumatic.  Cardiovascular:     Rate and Rhythm: Normal rate and regular rhythm.     Heart sounds: No murmur heard.    No friction rub. No gallop.  Pulmonary:     Effort: Pulmonary effort is normal. No respiratory distress.     Breath sounds: Normal breath sounds. No wheezing.  Abdominal:     General: Bowel sounds are normal.  There is no distension.     Palpations: Abdomen is soft.     Tenderness: There is no abdominal tenderness.  Musculoskeletal:        General: Normal range of motion.     Cervical back: Normal range of motion and neck supple.     Right lower leg: No tenderness. No edema.     Left lower leg: No tenderness. No edema.  Skin:    General: Skin is warm and dry.  Neurological:     General: No focal deficit present.     Mental Status: She is alert and oriented to person, place, and time.     ED Results / Procedures / Treatments   Labs (all labs ordered are listed, but only abnormal results are displayed) Labs Reviewed  BASIC METABOLIC PANEL - Abnormal; Notable for the following components:      Result Value   Creatinine, Ser 1.03 (*)    All other components within normal limits  CBC - Abnormal; Notable for the following components:   WBC 3.7 (*)    All other components within normal limits  HCG, SERUM, QUALITATIVE  TROPONIN I (HIGH SENSITIVITY)  TROPONIN I (HIGH SENSITIVITY)    EKG EKG Interpretation  Date/Time:  Tuesday August 22 2021  19:01:22 EDT Ventricular Rate:  93 PR Interval:  162 QRS Duration: 84 QT Interval:  324 QTC Calculation: 402 R Axis:   22 Text Interpretation: Normal sinus rhythm Cannot rule out Anterior infarct , age undetermined Abnormal ECG When compared with ECG of 03-May-2020 20:55, Nonspecific T wave abnormality, worse in Inferior leads Nonspecific T wave abnormality now evident in Anterolateral leads Confirmed by Geoffery Lyons ([id number]) on 08/22/2021 11:01:50 PM  Radiology DG Chest 2 View  Result Date: 08/22/2021 CLINICAL DATA:  Chest pain EXAM: CHEST - 2 VIEW COMPARISON:  05/03/2020 FINDINGS: The heart size and mediastinal contours are within normal limits. Both lungs are clear. The visualized skeletal structures are unremarkable. IMPRESSION: No active cardiopulmonary disease. Electronically Signed   By: Jasmine Pang M.D.   On: 08/22/2021 19:28     Procedures Procedures    Medications Ordered in ED Medications - No data to display  ED Course/ Medical Decision Making/ A&P  This patient presents to the ED for concern of palpitations, this involves an extensive number of treatment options, and is a complaint that carries with it a high risk of complications and morbidity.  The differential diagnosis includes PVCs, paroxysmal A-fib, SVT   Co morbidities that complicate the patient evaluation  None   Additional history obtained:  No additional history or external records needed   Lab Tests:  I Ordered, and personally interpreted labs.  The pertinent results include: Unremarkable CBC, metabolic panel, and troponin x2   Imaging Studies ordered:  I ordered imaging studies including chest x-ray I independently visualized and interpreted imaging which showed no acute process I agree with the radiologist interpretation   Cardiac Monitoring: / EKG:  The patient was maintained on a cardiac monitor.  I personally viewed and interpreted the cardiac monitored which showed an underlying rhythm of: Sinus   Consultations Obtained:  No consultations needed emergently, but patient will be referred to cardiology for consideration of Holter monitoring   Problem List / ED Course / Critical interventions / Medication management  Patient presents with palpitations that have been ongoing for many months, but are worse over the past few days.  I have been unable to capture any ectopy on her cardiac monitoring.  She has been in a sinus rhythm throughout with normal electrolytes and negative troponin x2.  Patient may be experiencing PVCs or other ectopy.  She will be referred to cardiology for consideration of Holter monitoring. I have reviewed the patients home medicines and have made adjustments as needed   Social Determinants of Health:  None   Test / Admission - Considered:  No emergent condition identified that would require  admission.  Patient will be referred to cardiology as an outpatient.  Final Clinical Impression(s) / ED Diagnoses Final diagnoses:  None    Rx / DC Orders ED Discharge Orders     None         Geoffery Lyons, MD 08/23/21 0000

## 2021-08-22 NOTE — ED Triage Notes (Signed)
Patient here POV from Home.  Endorses CP that is Mid Chest and Non-Radiating. Began Last PM. Also endorses Palpitations Infrequently the Past Month but more Constant recently.   Mild Nausea. No Emesis. Mild SOB. No Diarrhea.   NAD Noted during Triage. A&Ox4. GCS 15. Ambulatory.

## 2021-08-23 NOTE — ED Notes (Signed)
Pt verbalizes understanding of discharge instructions. Opportunity for questioning and answers were provided. Pt discharged from ED to home.   ? ?

## 2021-08-23 NOTE — Discharge Instructions (Signed)
Follow-up with cardiology to discuss possible Holter monitoring.  The contact information for the [facility] cardiology office on [facility] has been provided in this discharge summary for you to call and make these arrangements.  Return to the emergency department if symptoms worsen or change.

## 2021-09-09 ENCOUNTER — Emergency Department (HOSPITAL_COMMUNITY): Payer: BC Managed Care – PPO

## 2021-09-09 ENCOUNTER — Other Ambulatory Visit: Payer: Self-pay

## 2021-09-09 ENCOUNTER — Emergency Department (HOSPITAL_COMMUNITY)
Admission: EM | Admit: 2021-09-09 | Discharge: 2021-09-09 | Disposition: A | Discharge status: Home / Self Care | Payer: BC Managed Care – PPO | Attending: Emergency Medicine | Admitting: Emergency Medicine

## 2021-09-09 DIAGNOSIS — R42 Dizziness and giddiness: Secondary | ICD-10-CM | POA: Diagnosis not present

## 2021-09-09 DIAGNOSIS — R112 Nausea with vomiting, unspecified: Secondary | ICD-10-CM | POA: Insufficient documentation

## 2021-09-09 DIAGNOSIS — G35 Multiple sclerosis: Secondary | ICD-10-CM | POA: Diagnosis not present

## 2021-09-09 LAB — URINALYSIS, ROUTINE W REFLEX MICROSCOPIC
Bilirubin Urine: NEGATIVE
Glucose, UA: NEGATIVE mg/dL
Hgb urine dipstick: NEGATIVE
Ketones, ur: NEGATIVE mg/dL
Leukocytes,Ua: NEGATIVE
Nitrite: NEGATIVE
Protein, ur: NEGATIVE mg/dL
Specific Gravity, Urine: 1.029 (ref 1.005–1.030)
pH: 5 (ref 5.0–8.0)

## 2021-09-09 LAB — BASIC METABOLIC PANEL
Anion gap: 10 (ref 5–15)
BUN: 10 mg/dL (ref 6–20)
CO2: 24 mmol/L (ref 22–32)
Calcium: 9 mg/dL (ref 8.9–10.3)
Chloride: 106 mmol/L (ref 98–111)
Creatinine, Ser: 1.02 mg/dL — ABNORMAL HIGH (ref 0.44–1.00)
GFR, Estimated: >60 mL/min (ref >60)
Glucose, Bld: 93 mg/dL (ref 70–99)
Potassium: 3.7 mmol/L (ref 3.5–5.1)
Sodium: 140 mmol/L (ref 135–145)

## 2021-09-09 LAB — CBC
HCT: 38.5 % (ref 36.0–46.0)
Hemoglobin: 13.2 g/dL (ref 12.0–15.0)
MCH: 30.6 pg (ref 26.0–34.0)
MCHC: 34.3 g/dL (ref 30.0–36.0)
MCV: 89.1 fL (ref 80.0–100.0)
Platelets: 301 10*3/uL (ref 150–400)
RBC: 4.32 MIL/uL (ref 3.87–5.11)
RDW: 13 % (ref 11.5–15.5)
WBC: 4.1 10*3/uL (ref 4.0–10.5)
nRBC: 0 % (ref 0.0–0.2)

## 2021-09-09 LAB — CBG MONITORING, ED: Glucose-Capillary: 76 mg/dL (ref 70–99)

## 2021-09-09 MED ORDER — GADOBUTROL 1 MMOL/ML IV SOLN
13.0000 mL | Freq: Once | INTRAVENOUS | Status: AC | PRN
  Administered 2021-09-09: 13 mL via INTRAVENOUS

## 2021-09-09 MED ORDER — MECLIZINE HCL 25 MG PO TABS
25.0000 mg | ORAL_TABLET | Freq: Once | ORAL | Status: AC
Start: 2021-09-09 — End: 2021-09-09
  Administered 2021-09-09: 25 mg via ORAL
  Filled 2021-09-09: qty 1

## 2021-09-09 MED ORDER — LORAZEPAM 2 MG/ML IJ SOLN: 1.0000 mg | Freq: Once | INTRAMUSCULAR | Status: DC | PRN

## 2021-09-09 MED ORDER — SODIUM CHLORIDE 0.9 % IV BOLUS
1000.0000 mL | Freq: Once | INTRAVENOUS | Status: AC
  Administered 2021-09-09: 1000 mL via INTRAVENOUS

## 2021-09-09 NOTE — ED Notes (Signed)
Discharge instructions reviewed with patient. Patient denies any questions or concerns. Pt ambulatory out of ED.  

## 2021-09-09 NOTE — ED Triage Notes (Signed)
Pt via POV c/o persistent dizziness even at rest, generalized fatigue, and nausea without vomiting x3 days. Hx MS, symptoms feel different from MS flare up. Denies HX of vertigo. No CP/SOB, No Abd pain, No Cough/fever/chills.

## 2021-09-09 NOTE — ED Notes (Signed)
Patient transported to MRI 

## 2021-09-09 NOTE — ED Notes (Signed)
Awaiting ED provider assessment. NAD

## 2021-09-09 NOTE — ED Provider Notes (Signed)
  Physical Exam  BP (!) 106/58   Pulse 76   Temp 97.7 F (36.5 C) (Oral)   Resp 18   Ht 5\' 7"  (1.702 m)   Wt 130.6 kg   SpO2 100%   BMI 45.11 kg/m     Procedures  Procedures  ED Course / MDM    Medical Decision Making Amount and/or Complexity of Data Reviewed Labs: ordered. Radiology: ordered.  Risk Prescription drug management.   63F, presenting with dizziness. No other neuro complaints, no neuro deficits on exam. Hx of MS. Neuro consulted and recommend MRI Brain which is pending.  Results: IMPRESSION:  Multiple sclerosis with stable plaque burden when compared to  03/24/2021. No acute finding   On repeat assessment, the patient feels symptomatically improved.  No concern for active MS flare at this time.  Advised that the patient follow-up outpatient with her neurologist.  Stable for discharge.       03/26/2021, MD 09/09/21 [phone]

## 2021-09-09 NOTE — ED Notes (Signed)
Pt returned from MRI °

## 2021-09-09 NOTE — Discharge Instructions (Signed)
Please follow-up with your neurologist.

## 2021-09-09 NOTE — ED Provider Notes (Signed)
[facility] EMERGENCY DEPARTMENT Provider Note   CSN: [id number] Arrival date & time: 09/09/21  0154     History  Chief Complaint  Patient presents with   Dizziness    Amanda Wise is a 36 y.o. female.  Patient presents to the emergency department for evaluation of dizziness.  Patient reports that symptoms have been present for 3 days.  Dizziness does not seem to change with position.  She is dizzy lying at rest.  She does report that it is worse if she closes her eyes.  Dizziness has been persistent and severe enough that it causes her to have nausea and vomiting.  Patient reports that she has a history of MS and has had flares with similar symptoms in the past.  No numbness, tingling, weakness of extremities.       Home Medications Prior to Admission medications   Medication Sig Start Date End Date Taking? Authorizing Provider  amLODipine (NORVASC) 5 MG tablet Take 1 tablet (5 mg total) by mouth daily. 08/03/21   Philip Aspen, Limmie Patricia, MD  Siponimod Fumarate (MAYZENT) 2 MG TABS Take 1 tablet by mouth daily. 02/20/21   Sater, Pearletha Furl, MD      Allergies    Patient has no known allergies.    Review of Systems   Review of Systems  Physical Exam Updated Vital Signs BP 112/71   Pulse 71   Temp 97.8 F (36.6 C) (Oral)   Resp 18   Ht 5\' 7"  (1.702 m)   Wt 130.6 kg   SpO2 100%   BMI 45.11 kg/m  Physical Exam Vitals and nursing note reviewed.  Constitutional:      General: She is not in acute distress.    Appearance: She is well-developed.  HENT:     Head: Normocephalic and atraumatic.     Mouth/Throat:     Mouth: Mucous membranes are moist.  Eyes:     General: Vision grossly intact. Gaze aligned appropriately.     Extraocular Movements: Extraocular movements intact.     Conjunctiva/sclera: Conjunctivae normal.  Cardiovascular:     Rate and Rhythm: Normal rate and regular rhythm.     Pulses: Normal pulses.     Heart sounds: Normal  heart sounds, S1 normal and S2 normal. No murmur heard.    No friction rub. No gallop.  Pulmonary:     Effort: Pulmonary effort is normal. No respiratory distress.     Breath sounds: Normal breath sounds.  Abdominal:     General: Bowel sounds are normal.     Palpations: Abdomen is soft.     Tenderness: There is no abdominal tenderness. There is no guarding or rebound.     Hernia: No hernia is present.  Musculoskeletal:        General: No swelling.     Cervical back: Full passive range of motion without pain, normal range of motion and neck supple. No spinous process tenderness or muscular tenderness. Normal range of motion.     Right lower leg: No edema.     Left lower leg: No edema.  Skin:    General: Skin is warm and dry.     Capillary Refill: Capillary refill takes less than 2 seconds.     Findings: No ecchymosis, erythema, rash or wound.  Neurological:     General: No focal deficit present.     Mental Status: She is alert and oriented to person, place, and time.     GCS:  GCS eye subscore is 4. GCS verbal subscore is 5. GCS motor subscore is 6.     Cranial Nerves: Cranial nerves 2-12 are intact.     Sensory: Sensation is intact.     Motor: Motor function is intact.     Coordination: Coordination is intact.     Comments: Extraocular muscle movement: normal No visual field cut Pupils: equal and reactive both direct and consensual response is normal No nystagmus present    Sensory function is intact to light touch, pinprick Proprioception intact  Grip strength 5/5 symmetric in upper extremities No pronator drift Normal finger to nose bilaterally  Lower extremity strength 5/5 against gravity Normal heel to shin bilaterally  Gait: normal Extraocular muscle movement: normal No visual field cut Pupils: equal and reactive both direct and consensual response is normal No nystagmus present    Sensory function is intact to light touch, pinprick Proprioception intact  Grip  strength 5/5 symmetric in upper extremities No pronator drift Normal finger to nose bilaterally  Lower extremity strength 5/5 against gravity Normal heel to shin bilaterally  Gait: normal   Psychiatric:        Attention and Perception: Attention normal.        Mood and Affect: Mood normal.        Speech: Speech normal.        Behavior: Behavior normal.     ED Results / Procedures / Treatments   Labs (all labs ordered are listed, but only abnormal results are displayed) Labs Reviewed  BASIC METABOLIC PANEL - Abnormal; Notable for the following components:      Result Value   Creatinine, Ser 1.02 (*)    All other components within normal limits  URINALYSIS, ROUTINE W REFLEX MICROSCOPIC - Abnormal; Notable for the following components:   APPearance HAZY (*)    All other components within normal limits  CBC  CBG MONITORING, ED    EKG None  Radiology No results found.  Procedures Procedures    Medications Ordered in ED Medications - No data to display  ED Course/ Medical Decision Making/ A&P                           Medical Decision Making Amount and/or Complexity of Data Reviewed Labs: ordered. Radiology: ordered.   Patient presents to the emergency department for evaluation of dizziness.  She has a reassuring, essentially normal neurologic exam.  She does, however, continues to complain of dizziness.  She reports prior symptoms similar to this with MS flare.   Discussed briefly with Dr. Derry Lory, agrees with MRI of brain with and without contrast.  We will send to oncoming ER physician to follow-up results.        Final Clinical Impression(s) / ED Diagnoses Final diagnoses:  Dizziness    Rx / DC Orders ED Discharge Orders     None         Yoshiaki Kreuser, Canary Brim, MD 09/09/21 [phone]

## 2021-09-12 ENCOUNTER — Encounter: Payer: Self-pay | Admitting: Internal Medicine

## 2021-09-12 ENCOUNTER — Ambulatory Visit (INDEPENDENT_AMBULATORY_CARE_PROVIDER_SITE_OTHER): Payer: BC Managed Care – PPO | Admitting: Internal Medicine

## 2021-09-12 VITALS — BP 120/85 | HR 91 | Temp 98.1°F | Wt 290.8 lb

## 2021-09-12 DIAGNOSIS — R42 Dizziness and giddiness: Secondary | ICD-10-CM

## 2021-09-12 DIAGNOSIS — I10 Essential (primary) hypertension: Secondary | ICD-10-CM | POA: Diagnosis not present

## 2021-09-12 NOTE — Progress Notes (Signed)
Established Patient Office Visit     CC/Reason for Visit: Follow-up blood pressure, dizziness  HPI: Namiyah Grantham is a 36 y.o. female who is coming in today for the above mentioned reasons. Past Medical History is significant for: Multiple sclerosis, morbid obesity and she has been newly diagnosed with hypertension.  6 weeks ago she was started on amlodipine 5 mg daily.  And she is here today to follow-up in regards to that.  Her in office blood pressure today is 120/85.  In the interim she had to visit the emergency department twice.  First time end of July for chest pain and was ruled out however she has been referred to cardiology.  Second time was 3 days ago for dizziness which she describes as a sensation of room spinning around her nausea.  She cannot determine any laterality.  During that emergency department visit she did have a consultation by neurology who recommended an MRI of the brain due to her history of multiple sclerosis.  MRI showed stable plaques and no acute abnormalities.   Past Medical/Surgical History: Past Medical History:  Diagnosis Date   Anemia    Heart palpitations    Hypertension    MS (multiple sclerosis) (HCC)    Pregnancy induced hypertension     Past Surgical History:  Procedure Laterality Date   NO PAST SURGERIES      Social History:  reports that she has never smoked. She has never used smokeless tobacco. She reports that she does not drink alcohol and does not use drugs.  Allergies: No Known Allergies  Family History:  Family History  Problem Relation Age of Onset   Hypertension Other    Hypertension Maternal Grandmother    Hypertension Paternal Grandmother    Diabetes Father    High blood pressure Father      Current Outpatient Medications:    amLODipine (NORVASC) 5 MG tablet, Take 1 tablet (5 mg total) by mouth daily., Disp: 90 tablet, Rfl: 1   Siponimod Fumarate (MAYZENT) 2 MG TABS, Take 1 tablet by mouth daily., Disp:  90 tablet, Rfl: 3  Review of Systems:  Constitutional: Denies fever, chills, diaphoresis, appetite change and fatigue.  HEENT: Denies photophobia, eye pain, redness, hearing loss, ear pain, congestion, sore throat, rhinorrhea, sneezing, mouth sores, trouble swallowing, neck pain, neck stiffness and tinnitus.   Respiratory: Denies SOB, DOE, cough, chest tightness,  and wheezing.   Cardiovascular: Denies chest pain, palpitations and leg swelling.  Gastrointestinal: Denies nausea, vomiting, abdominal pain, diarrhea, constipation, blood in stool and abdominal distention.  Genitourinary: Denies dysuria, urgency, frequency, hematuria, flank pain and difficulty urinating.  Endocrine: Denies: hot or cold intolerance, sweats, changes in hair or nails, polyuria, polydipsia. Musculoskeletal: Denies myalgias, back pain, joint swelling, arthralgias and gait problem.  Skin: Denies pallor, rash and wound.  Neurological: Denies  seizures, syncope, weakness,, numbness and headaches.  Hematological: Denies adenopathy. Easy bruising, personal or family bleeding history  Psychiatric/Behavioral: Denies suicidal ideation, mood changes, confusion, nervousness, sleep disturbance and agitation    Physical Exam: Vitals:   09/12/21 1253  BP: 120/85  Pulse: 91  Temp: 98.1 F (36.7 C)  TempSrc: Oral  SpO2: 100%  Weight: 290 lb 12.8 oz (131.9 kg)    Body mass index is 45.55 kg/m.   Constitutional: NAD, calm, comfortable Eyes: PERRL, lids and conjunctivae normal ENMT: Mucous membranes are moist.  Respiratory: clear to auscultation bilaterally, no wheezing, no crackles. Normal respiratory effort. No accessory muscle use.  Cardiovascular: Regular rate and rhythm, no murmurs / rubs / gallops. No extremity edema.  Psychiatric: Normal judgment and insight. Alert and oriented x 3. Normal mood.    Impression and Plan:  Primary hypertension -Blood pressure is well controlled on newly started amlodipine, given  she is normotensive do not believe her dizziness is related to this.  Vertigo  - Plan: Ambulatory Referral to Neuro Rehab -Suspect BPPV given description, unable to determine laterality.  Recent negative MRI is reassuring.    Time spent:31 minutes reviewing chart, interviewing and examining patient and formulating plan of care.     Chaya Jan, MD [facility]

## 2021-09-13 ENCOUNTER — Ambulatory Visit (INDEPENDENT_AMBULATORY_CARE_PROVIDER_SITE_OTHER): Payer: BC Managed Care – PPO | Admitting: Neurology

## 2021-09-13 ENCOUNTER — Encounter: Payer: Self-pay | Admitting: Neurology

## 2021-09-13 ENCOUNTER — Ambulatory Visit: Payer: BC Managed Care – PPO | Admitting: Cardiovascular Disease

## 2021-09-13 VITALS — BP 128/83 | HR 72 | Ht 67.0 in | Wt 292.5 lb

## 2021-09-13 DIAGNOSIS — R5383 Other fatigue: Secondary | ICD-10-CM | POA: Diagnosis not present

## 2021-09-13 DIAGNOSIS — R269 Unspecified abnormalities of gait and mobility: Secondary | ICD-10-CM

## 2021-09-13 DIAGNOSIS — Z79899 Other long term (current) drug therapy: Secondary | ICD-10-CM

## 2021-09-13 DIAGNOSIS — H8112 Benign paroxysmal vertigo, left ear: Secondary | ICD-10-CM | POA: Diagnosis not present

## 2021-09-13 DIAGNOSIS — G35 Multiple sclerosis: Secondary | ICD-10-CM

## 2021-09-13 DIAGNOSIS — G4733 Obstructive sleep apnea (adult) (pediatric): Secondary | ICD-10-CM

## 2021-09-13 NOTE — Progress Notes (Signed)
[facility]  PATIENT: Amanda Wise DOB: [birth year]  REFERRING DOCTOR OR PCP: Ritta Slot SOURCE: Patient, notes from emergency room and hospital admission, laboratory reports, imaging reports, MRI images personally reviewed.  _________________________________   HISTORICAL  CHIEF COMPLAINT:  Chief Complaint  Patient presents with   Follow-up    Rm 1, alone. Here for 6 month MS f/u, on Mayzent and tolerating well. Pt reports having dizziness for the last weeks. Was seen at the ED and had MRI done.     HISTORY OF PRESENT ILLNESS:  Amanda Wise is a 36 y.o. woman who was diagnosed with multiple sclerosis in December 2019.  Update 09/13/2021 MRI 03/24/2021 showed "  Multiple T2/FLAIR hyperintense foci in the brainstem, cervical cerebellum, spinal cord and cerebral hemispheres in a pattern consistent with demyelinating plaque associated with multiple sclerosis.  1 focus in the left parietal lobe enhances consistent with an acute demyelinating plaque.  Additionally, there are 4 chronic plaques in the hemispheres seen on the current MRI that were not present on the MRI from 05/23/2019 consistent with plaques developing during the interim.".  I called her to discuss changing DMT and she reported that compliance was poor .   She has since been very comliant with the Mayzent.     MRI cervical spine 03/08/2021 showed  Four T2 hyperintense foci within the spinal cord adjacent to C2-C3, C3-C4, C4 and C6-C7.  The foci at C2-C3, C4 C6-C7 were clearly present on the previous MRI and the C2-C3 and C6-C7 foci were larger and enhanced at that time.  The focus adjacent to C3-C4 posterolaterally to the right was not clearly present on the previous MRI and could represent interval development of an additional plaque.  1 focus adjacent to C4-C5 to the right on the 01/30/2018 MRI was not clearly present on the current MRI.   On August 5, she had the onset of vertigo and another  MRI was performed showing no new lesions.   The vertigo was present 24/7 but worse with her eyes closed or with movements.   Sitting still reduced the vertigo but not 100%.   Bendingover and sitting up worsened the vertigo.   Vestibular therapy was ordered but she has not started.   Over the past few days, on its own, the vertigo has improved about 50-60%.    However, she continues to feel a little swimmy headed while sitting with intensification of vertigo with movements.  She has been on siponimod and she tolerates it well. No exacerbation since her last visit        She is walking well and could easily do a couple miles at the same pace as others.  She has been using the bannister on stairs  lately but was not before the vertigo.   Strength is doing well.  She has right big toe numbnes/pain.  No numbness elsewhere..     Right facial myokymia resolved.      Vision is baseline and colors are symmetric.   Her bladder is doing well.    Fatigue occurs some days..   Heat makes this wore     She has fairly mild cognitive issues - forgetful, reduced focus/attention and some word finding issues..   Although it did help fatigue, she stopped the Adderall was stopped due to increased BP.    She has OSA, (mild overall but severe during REM).  She felt much better on CPAP but insurance did not cover it well and she  opted not to stay  on CPAP.    No depression but she has some  EPWORTH SLEEPINESS SCALE  On a scale of 0 - 3 what is the chance of dozing:  Sitting and Reading:   2 Watching TV:    3 Sitting inactive in a public place: 1 Passenger in car for one hour: 3 Lying down to rest in the afternoon: 2 Sitting and talking to someone: 0 Sitting quietly after lunch:  1 In a car, stopped in traffic:  0  Total (out of 24):   12/24   Mild EDS  Vit D was low (23) and she is on 50000 U weekly.    Lymphocytes were 0.5 01/03/2021.   ALT = 51 (mildly high)ut AST = 30 (normal)   MS HISTORY: She had an episode  of bilateral leg weakness in 2012, she improved over 4-5 months.  She felt symptoms were due to working out.  In 2016, she had the onset of poor balance x 3-6 months that completely resolved.    She never sought out medical/neurologic care.  In August 2019, she had some weakness and numbness in her legs for a couple weeks but then improved after she saw a [facility].    In December 2019,  she had the onset of tingling in both hands and right hand weakness.   She went to the ED.  She had MRI of the brain and MRI of the cervical spine and they were consistent with MS.  She was admitted and received 5 days of IV Solu-Medrol.   She felt better by her discharge, closer to her baseline.   She went on Tysabri initially, but due to cost switched to a siponimod drug study in 2022.   She has been on siponimod/Mayzent since January 2021.   She had a relapse with acute MRI cahnges 03/2021  IMAGING: MRI of the brain and cervical spine dated 01/30/2018.  The brain shows multiple T2/flair hyperintense foci in the hemispheres in the periventricular, juxtacortical and deep white matter.  There are 2 foci peripherally to the right in the pons and another focus in the left middle cerebellar peduncle.  The one pontine focus enhances.  2 small foci in the hemispheres enhance.  The MRI of the cervical spine shows several foci in the spinal cord including 1 at C3 to the left and 1 at C6-C7 on the right that enhance.    The other foci located at C2-C3 posteriorly and C3 posteriorly to the right.  MRI of the brain 05/23/2019 showed multiple T2/FLAIR hyperintense foci in the brainstem, cerebellum and cerebral hemispheres in a pattern and configuration consistent with chronic demyelinating plaque associated with multiple sclerosis. None of the foci appear to be acute. Compared to the MRI from 01/30/2018, there are no new lesions. Several foci that were enhancing in 2019 no longer do so.   Other studies: Polysomnography 04/23/2018  showed mild overall AHI (10.6) that was severe during REM sleep (REM AHI equals 37.9).  There is no significant restless leg syndrome.   She tolerated CPAP (APAP) but had high copays.     REVIEW OF SYSTEMS: Constitutional: No fevers, chills, sweats, or change in appetite Eyes: No visual changes, double vision, eye pain Ear, nose and throat: No hearing loss, ear pain, nasal congestion, sore throat Cardiovascular: No chest pain, palpitations Respiratory:  No shortness of breath at rest or with exertion.   No wheezes GastrointestinaI: No nausea, vomiting, diarrhea, abdominal pain, fecal incontinence Genitourinary:  No dysuria, urinary retention or frequency.  No nocturia. Musculoskeletal:  No neck pain, back pain Integumentary: No rash, pruritus, skin lesions Neurological: as above Psychiatric: No depression at this time.  No anxiety Endocrine: No palpitations, diaphoresis, change in appetite, change in weigh or increased thirst Hematologic/Lymphatic:  No anemia, purpura, petechiae. Allergic/Immunologic: No itchy/runny eyes, nasal congestion, recent allergic reactions, rashes  ALLERGIES: No Known Allergies  HOME MEDICATIONS:  Current Outpatient Medications:    amLODipine (NORVASC) 5 MG tablet, Take 1 tablet (5 mg total) by mouth daily., Disp: 90 tablet, Rfl: 1   Siponimod Fumarate (MAYZENT) 2 MG TABS, Take 1 tablet by mouth daily., Disp: 90 tablet, Rfl: 3  PAST MEDICAL HISTORY: Past Medical History:  Diagnosis Date   Anemia    Heart palpitations    Hypertension    MS (multiple sclerosis) (HCC)    Pregnancy induced hypertension     PAST SURGICAL HISTORY: Past Surgical History:  Procedure Laterality Date   NO PAST SURGERIES      FAMILY HISTORY: Family History  Problem Relation Age of Onset   Hypertension Other    Hypertension Maternal Grandmother    Hypertension Paternal Grandmother    Diabetes Father    High blood pressure Father     SOCIAL HISTORY:  Social History    Socioeconomic History   Marital status: Single    Spouse name: Not on file   Number of children: 2   Years of education: 14   Highest education level: Associate degree: occupational, Scientist, product/process development, or vocational program  Occupational History   Occupation: Orthoptist  Tobacco Use   Smoking status: Never   Smokeless tobacco: Never  Substance and Sexual Activity   Alcohol use: No   Drug use: No   Sexual activity: Yes    Birth control/protection: None  Other Topics Concern   Not on file  Social History Narrative   Lives with family   Caffeine use: soda sometimes   Right handed    Social Determinants of Health   Financial Resource Strain: Medium Risk (01/25/2021)   Overall Financial Resource Strain (CARDIA)    Difficulty of Paying Living Expenses: Somewhat hard  Food Insecurity: No Food Insecurity (01/25/2021)   Hunger Vital Sign    Worried About Running Out of Food in the Last Year: Never true    Ran Out of Food in the Last Year: Never true  Transportation Needs: Not on file  Physical Activity: Insufficiently Active (01/25/2021)   Exercise Vital Sign    Days of Exercise per Week: 3 days    Minutes of Exercise per Session: 30 min  Stress: Stress Concern Present (01/25/2021)   [employer] of Occupational Health - Occupational Stress Questionnaire    Feeling of Stress : Very much  Social Connections: Moderately Isolated (01/25/2021)   Social Connection and Isolation Panel [NHANES]    Frequency of Communication with Friends and Family: More than three times a week    Frequency of Social Gatherings with Friends and Family: Once a week    Attends Religious Services: Never    Database administrator or Organizations: No    Attends Engineer, structural: Not on file    Marital Status: Living with partner  Intimate Partner Violence: Not on file     PHYSICAL EXAM  Vitals:   09/13/21 0810  BP: 128/83  Pulse: 72  Weight: 292 lb 8 oz (132.7 kg)   Height: 5\' 7"  (1.702 m)     Body mass  index is 45.81 kg/m.  No results found.   General: The patient is well-developed and well-nourished and in no acute distress.  Abdomen is nontender with normal sounds.  HEENT: Tympanic membranes were intact on the right.  There is a fair amount of wax in the left external ear canal and I could not clearly see the tympanic membrane.  Funduscopic exam shows normal optic discs and retinal vessels.   Skin: Extremities are without rash or edema.   Neurologic Exam  Mental status: The patient is alert and oriented x 3 at the time of the examination. The patient has apparent normal recent and remote memory, with an apparently normal attention span and concentration ability.   Speech is normal.  Cranial nerves: Extraocular movements are full.  She has a mild right APD.  Color vision was symmetric, however.  Facial strength and sensation was normal.  There was no myokymia.  Although hearing was symmetric to the tuning fork, the Weber lateralized towards the left.  Motor:  Muscle bulk is normal.   Tone is normal. Strength is  5 / 5 in all 4 extremities.   Sensory: She has intact sensation to touch and vibration in the arms and legs.  Coordination: Cerebellar testing reveals good finger-nose-finger and heel-to-shin bilaterally.  Gait and station: Station is normal.   Gait is normal.  Tandem gait is mildly wide.  Romberg is negative.  Reflexes: Deep tendon reflexes are symmetric and normal in arms and increased in legs without clonus.       DIAGNOSTIC DATA (LABS, IMAGING, TESTING) - I reviewed patient records, labs, notes, testing and imaging myself where available.  Lab Results  Component Value Date   WBC 4.1 09/09/2021   HGB 13.2 09/09/2021   HCT 38.5 09/09/2021   MCV 89.1 09/09/2021   PLT 301 09/09/2021      Component Value Date/Time   NA 140 09/09/2021 0216   K 3.7 09/09/2021 0216   CL 106 09/09/2021 0216   CO2 24 09/09/2021 0216    GLUCOSE 93 09/09/2021 0216   BUN 10 09/09/2021 0216   CREATININE 1.02 (H) 09/09/2021 0216   CREATININE 1.06 (H) 02/16/2021 0856   CALCIUM 9.0 09/09/2021 0216   PROT 6.7 02/16/2021 0856   PROT 6.9 09/01/2020 1601   ALBUMIN 4.0 01/03/2021 1031   ALBUMIN 4.1 09/01/2020 1601   AST 18 02/16/2021 0856   ALT 27 02/16/2021 0856   ALKPHOS 73 01/03/2021 1031   BILITOT 0.4 02/16/2021 0856   BILITOT 0.3 09/01/2020 1601   GFRNONAA >60 09/09/2021 0216   GFRAA >60 12/22/2018 0240     ____________________________________________________________   1. Multiple sclerosis (HCC)   2. Other fatigue   3. High risk medication use   4. Neurologic gait dysfunction   5. OSA (obstructive sleep apnea)   6. Benign paroxysmal positional vertigo of right ear        1.    Acute activity on the February 2023 MRI was at a time that she was noncompliant on the medication.  She went back on Mayzent and repeat MRI earlier this month did not show any change compared to February.  Therefore, we will continue the Mayzent.  If a relapse occurs or she has trouble with a daily po med go back on Tysabri we will consider an anti-CD20 agent.    2.    Stay active and exercise as tolerated. 3.    We also discussed reconsideration of CPAP (she tolerated but insurance had high  copays).  .  We discussed weight loss she has lost some weiht and trying to lose more 4.    Epley maneuver starting with left ear down .  Instructed on B-D exercises .  Get wax drops for left ear.   5.    rtc 6 months or sooner if new or worsening neurologic issues.  48-minute office visit with the majority of the time spent face-to-face for history and physical, discussion/counseling and decision-making.  Additional time with record review and documentation.  Takumi Din A. Epimenio Foot, MD, [facility] 09/13/2021, 9:12 AM Certified in Neurology, Clinical Neurophysiology, Sleep Medicine, Pain Medicine and Neuroimaging  [facility] [address], [address] [place], [place] [address] [phone]

## 2021-09-14 LAB — CBC WITH DIFFERENTIAL/PLATELET
Basophils Absolute: 0 10*3/uL (ref 0.0–0.2)
Basos: 0 %
EOS (ABSOLUTE): 0.1 10*3/uL (ref 0.0–0.4)
Eos: 2 %
Hematocrit: 35.6 % (ref 34.0–46.6)
Hemoglobin: 12.1 g/dL (ref 11.1–15.9)
Immature Grans (Abs): 0 10*3/uL (ref 0.0–0.1)
Immature Granulocytes: 1 %
Lymphocytes Absolute: 0.4 10*3/uL — ABNORMAL LOW (ref 0.7–3.1)
Lymphs: 10 %
MCH: 29.9 pg (ref 26.6–33.0)
MCHC: 34 g/dL (ref 31.5–35.7)
MCV: 88 fL (ref 79–97)
Monocytes Absolute: 0.6 10*3/uL (ref 0.1–0.9)
Monocytes: 17 %
Neutrophils Absolute: 2.7 10*3/uL (ref 1.4–7.0)
Neutrophils: 70 %
Platelets: 281 10*3/uL (ref 150–450)
RBC: 4.05 x10E6/uL (ref 3.77–5.28)
RDW: 12.2 % (ref 11.7–15.4)
WBC: 3.8 10*3/uL (ref 3.4–10.8)

## 2021-09-18 DIAGNOSIS — Z0289 Encounter for other administrative examinations: Secondary | ICD-10-CM

## 2021-10-03 NOTE — Telephone Encounter (Signed)
Pfyi - atient advised needs for potential and  missed days on the [facility] paperwork. Pt came into the lobby today.

## 2021-10-03 NOTE — Progress Notes (Unsigned)
Cardiology Office Note:    Date:  10/04/2021   ID:  Amanda, Wise Mar 14, 1985, MRN [id number]  PCP:  Amanda Wise, Amanda Patricia, MD   [facility] Providers Cardiologist:  None     Referring MD: Amanda Wise, Amanda Wise*   CC: Palpitations Consulted for the evaluation of palpitations at the behest of Dr. Philip Wise  History of Present Illness:    Amanda Wise is a 36 y.o. female with a hx of HTN, gestational HTN, Morbid Obedisty, mild OSA, who presents for evaluation.  Patient notes that she is feeling palpitations for years.  Now they are more frequent.  For the past two weeks, has them frequent.   Every time she has been checked, they have been resolved.  Feels like a dropped beat.  Has had no chest pain, chest pressure, chest tightness, chest stinging . Patient exertion notable for walking and feels no symptoms.    No shortness of breath, DOE .  No PND or orthopnea.  No weight gain, leg swelling , or abdominal swelling.  No syncope or near syncope .  Ambulatory BP on done .  Past Medical History:  Diagnosis Date   Anemia    Heart palpitations    Hypertension    MS (multiple sclerosis) (HCC)    Pregnancy induced hypertension     Past Surgical History:  Procedure Laterality Date   NO PAST SURGERIES      Current Medications: Current Meds  Medication Sig   amLODipine (NORVASC) 5 MG tablet Take 1 tablet (5 mg total) by mouth daily.   Siponimod Fumarate (MAYZENT) 2 MG TABS Take 1 tablet by mouth daily.     Allergies:   Patient has no known allergies.   Social History   Socioeconomic History   Marital status: Single    Spouse name: Not on file   Number of children: 2   Years of education: 14   Highest education level: Associate degree: occupational, Scientist, product/process development, or vocational program  Occupational History   Occupation: Orthoptist  Tobacco Use   Smoking status: Never   Smokeless tobacco: Never  Substance and  Sexual Activity   Alcohol use: No   Drug use: No   Sexual activity: Yes    Birth control/protection: None  Other Topics Concern   Not on file  Social History Narrative   Lives with family   Caffeine use: soda sometimes   Right handed    Social Determinants of Health   Financial Resource Strain: Medium Risk (01/25/2021)   Overall Financial Resource Strain (CARDIA)    Difficulty of Paying Living Expenses: Somewhat hard  Food Insecurity: No Food Insecurity (01/25/2021)   Hunger Vital Sign    Worried About Running Out of Food in the Last Year: Never true    Ran Out of Food in the Last Year: Never true  Transportation Needs: Not on file  Physical Activity: Insufficiently Active (01/25/2021)   Exercise Vital Sign    Days of Exercise per Week: 3 days    Minutes of Exercise per Session: 30 min  Stress: Stress Concern Present (01/25/2021)   [employer] of Occupational Health - Occupational Stress Questionnaire    Feeling of Stress : Very much  Social Connections: Moderately Isolated (01/25/2021)   Social Connection and Isolation Panel [NHANES]    Frequency of Communication with Friends and Family: More than three times a week    Frequency of Social Gatherings with Friends and Family: Once a week  Attends Religious Services: Never    Active Member of Clubs or Organizations: No    Attends Engineer, structural: Not on file    Marital Status: Living with partner     Family History: The patient's family history includes Diabetes in her father; High blood pressure in her father; Hypertension in her maternal grandmother, paternal grandmother, and another family member. History of coronary artery disease notable for multiple members. History of arrhythmia notable for no members. Denies family history of sudden cardiac death including drowning, car accidents, or unexplained deaths in the family. No history of bicuspid aortic valve or aortic aneurysm or dissection.   No  history of cardiomyopathies including hypertrophic cardiomyopathy, left ventricular non-compaction, or arrhythmogenic right ventricular cardiomyopathy. No history of skeletal myopathies.  ROS:   Please see the history of present illness.     All other systems reviewed and are negative.  EKGs/Labs/Other Studies Reviewed:    The following studies were reviewed today:  EKG:   09/11/21: SR  Recent Labs: 01/03/2021: TSH 0.77 02/16/2021: ALT 27 09/09/2021: BUN 10; Creatinine, Ser 1.02; Potassium 3.7; Sodium 140 09/13/2021: Hemoglobin 12.1; Platelets 281  Recent Lipid Panel    Component Value Date/Time   CHOL 178 01/03/2021 1031   TRIG 118.0 01/03/2021 1031   HDL 51.80 01/03/2021 1031   CHOLHDL 3 01/03/2021 1031   VLDL 23.6 01/03/2021 1031   LDLCALC 102 (H) 01/03/2021 1031   LDLCALC 172 (H) 12/30/2019 0856    Physical Exam:    VS:  BP 118/79   Pulse 79   Ht 5\' 7"  (1.702 m)   Wt 131.1 kg   SpO2 98%   BMI 45.26 kg/m     Wt Readings from Last 3 Encounters:  10/04/21 131.1 kg  09/13/21 132.7 kg  09/12/21 131.9 kg    GEN: Morbid Obesity no acute distress HEENT: Normal NECK: No JVD LYMPHATICS: No lymphadenopathy CARDIAC: RRR, no murmurs, rubs, gallops RESPIRATORY:  Clear to auscultation without rales, wheezing or rhonchi  ABDOMEN: Soft, non-tender, non-distended MUSCULOSKELETAL:  No edema; No deformity  SKIN: Warm and dry NEUROLOGIC:  Alert and oriented x 3 PSYCHIATRIC:  Normal affect   ASSESSMENT:    1. Palpitations   2. Morbid obesity (HCC)   3. OSA (obstructive sleep apnea)   4. Essential hypertension   5. Mixed hyperlipidemia    PLAN:    Palpitations; possible SVT - will obtain 14-day non live heart monitor (ZioPatch) - AV Nodal Therapy: may offer tartrate 25 mg PO BID - TSH: WNL  HTN Morbid Obesity - on norvasc, start ambulatory BP - discussed exercise, will start graded exercise  OSA - failed CPAP; discussed mouthguard's  HLD Hx of gestational  HTN - at next visit fasting lipids        Medication Adjustments/Labs and Tests Ordered: Current medicines are reviewed at length with the patient today.  Concerns regarding medicines are outlined above.  No orders of the defined types were placed in this encounter.  No orders of the defined types were placed in this encounter.   There are no Patient Instructions on file for this visit.   Signed, 11/12/21, MD  10/04/2021 11:43 AM    [facility]

## 2021-10-04 ENCOUNTER — Ambulatory Visit: Payer: BC Managed Care – PPO | Attending: Internal Medicine

## 2021-10-04 ENCOUNTER — Ambulatory Visit: Payer: BC Managed Care – PPO | Attending: Cardiovascular Disease | Admitting: Internal Medicine

## 2021-10-04 ENCOUNTER — Encounter: Payer: Self-pay | Admitting: Internal Medicine

## 2021-10-04 VITALS — BP 118/79 | HR 79 | Ht 67.0 in | Wt 289.0 lb

## 2021-10-04 DIAGNOSIS — G4733 Obstructive sleep apnea (adult) (pediatric): Secondary | ICD-10-CM

## 2021-10-04 DIAGNOSIS — R002 Palpitations: Secondary | ICD-10-CM

## 2021-10-04 DIAGNOSIS — E782 Mixed hyperlipidemia: Secondary | ICD-10-CM

## 2021-10-04 DIAGNOSIS — I10 Essential (primary) hypertension: Secondary | ICD-10-CM

## 2021-10-04 NOTE — Progress Notes (Unsigned)
Enrolled for [employer] to mail a ZIO XT long term holter monitor to the patients address on file.  

## 2021-10-04 NOTE — Patient Instructions (Signed)
Medication Instructions:  NO CHANGES *If you need a refill on your cardiac medications before your next appointment, please call your pharmacy*   Lab Work: NONE  If you have labs (blood work) drawn today and your tests are completely normal, you will receive your results only by: MyChart Message (if you have MyChart) OR A paper copy in the mail If you have any lab test that is abnormal or we need to change your treatment, we will call you to review the results.   Testing/Procedures: Christena Deem- Long Term Monitor Instructions  Your physician has requested you wear a ZIO patch monitor for 14 days.  This is a single patch monitor. [employer] supplies one patch monitor per enrollment. Additional stickers are not available. Please do not apply patch if you will be having a Nuclear Stress Test,  Echocardiogram, Cardiac CT, MRI, or Chest Xray during the period you would be wearing the  monitor. The patch cannot be worn during these tests. You cannot remove and re-apply the  ZIO XT patch monitor.  Your ZIO patch monitor will be mailed 3 day USPS to your address on file. It may take 3-5 days  to receive your monitor after you have been enrolled.  Once you have received your monitor, please review the enclosed instructions. Your monitor  has already been registered assigning a specific monitor serial # to you.  Billing and Patient Assistance Program Information  We have supplied [employer] with any of your insurance information on file for billing purposes. [employer] offers a sliding scale Patient Assistance Program for patients that do not have  insurance, or whose insurance does not completely cover the cost of the ZIO monitor.  You must apply for the Patient Assistance Program to qualify for this discounted rate.  To apply, please call [employer] at [phone], select option 4, select option 2, ask to apply for  Patient Assistance Program. Meredeth Ide will ask your household income, and how many people   are in your household. They will quote your out-of-pocket cost based on that information.  [employer] will also be able to set up a 85-month, interest-free payment plan if needed.  Applying the monitor   Shave hair from upper left chest.  Hold abrader disc by orange tab. Rub abrader in 40 strokes over the upper left chest as  indicated in your monitor instructions.  Clean area with 4 enclosed alcohol pads. Let dry.  Apply patch as indicated in monitor instructions. Patch will be placed under collarbone on left  side of chest with arrow pointing upward.  Rub patch adhesive wings for 2 minutes. Remove white label marked "1". Remove the white  label marked "2". Rub patch adhesive wings for 2 additional minutes.  While looking in a mirror, press and release button in center of patch. A small green light will  flash 3-4 times. This will be your only indicator that the monitor has been turned on.  Do not shower for the first 24 hours. You may shower after the first 24 hours.  Press the button if you feel a symptom. You will hear a small click. Record Date, Time and  Symptom in the Patient Logbook.  When you are ready to remove the patch, follow instructions on the last 2 pages of Patient  Logbook. Stick patch monitor onto the last page of Patient Logbook.  Place Patient Logbook in the blue and white box. Use locking tab on box and tape box closed  securely. The blue and white box  has prepaid postage on it. Please place it in the mailbox as  soon as possible. Your physician should have your test results approximately 7 days after the  monitor has been mailed back to [facility].  Call [facility] Customer Care at [phone] if you have questions regarding  your ZIO XT patch monitor. Call them immediately if you see an orange light blinking on your  monitor.  If your monitor falls off in less than 4 days, contact our Monitor department at [phone].  If your monitor becomes loose or  falls off after 4 days call [employer] at [phone] for  suggestions on securing your monitor    Follow-Up: At [facility], you and your health needs are our priority.  As part of our continuing mission to provide you with exceptional heart care, we have created designated Provider Care Teams.  These Care Teams include your primary Cardiologist (physician) and Advanced Practice Providers (APPs -  Physician Assistants and Nurse Practitioners) who all work together to provide you with the care you need, when you need it.  We recommend signing up for the patient portal called "MyChart".  Sign up information is provided on this After Visit Summary.  MyChart is used to connect with patients for Virtual Visits (Telemedicine).  Patients are able to view lab/test results, encounter notes, upcoming appointments, etc.  Non-urgent messages can be sent to your provider as well.   To learn more about what you can do with MyChart, go to [email or web address].    Your next appointment:   4 month(s)  The format for your next appointment:   In Person  Provider:   DR Izora Ribas  or Chelsea Aus, PA-C, Jari Favre, PA-C, Ronie Spies, PA-C, Robin Searing, NP, Jacolyn Reedy, PA-C, Eligha Bridegroom, NP, or Tereso Newcomer, PA-C     Other Instructions NONE  Important Information About Sugar

## 2021-10-05 DIAGNOSIS — R002 Palpitations: Secondary | ICD-10-CM | POA: Diagnosis not present

## 2021-10-16 NOTE — Telephone Encounter (Signed)
Gave completed/signed form back to medical records to process for pt. 

## 2021-11-01 ENCOUNTER — Encounter: Payer: Self-pay | Admitting: Internal Medicine

## 2021-12-27 ENCOUNTER — Other Ambulatory Visit: Payer: Self-pay | Admitting: Internal Medicine

## 2021-12-27 DIAGNOSIS — I10 Essential (primary) hypertension: Secondary | ICD-10-CM

## 2022-01-09 NOTE — Progress Notes (Deleted)
Cardiology Office Note:    Date:  01/09/2022   ID:  Amanda Wise, DOB 09-15-1985, MRN [id number]  PCP:  Amanda Wise, Amanda Patricia, MD   [facility] Providers Cardiologist:  None     Referring MD: Amanda Wise, Amanda Wise*   CC: HTN and HLD f/u  History of Present Illness:    Amanda Wise is a 36 y.o. female with a hx of HTN, gestational HTN, Morbid Obedisty, mild OSA, who presents for evaluation. 2023: negative heart monitor.  Patient notes that she is doing ***.   Since last visit notes *** . There are no*** interval hospital/ED visit.    No chest pain or pressure ***.  No SOB/DOE*** and no PND/Orthopnea***.  No weight gain or leg swelling***.  No palpitations or syncope ***.  Ambulatory blood pressure ***.   Past Medical History:  Diagnosis Date   Anemia    Heart palpitations    Hypertension    MS (multiple sclerosis) (HCC)    Pregnancy induced hypertension     Past Surgical History:  Procedure Laterality Date   NO PAST SURGERIES      Current Medications: No outpatient medications have been marked as taking for the 01/10/22 encounter (Appointment) with Christell Constant, MD.     Allergies:   Patient has no known allergies.   Social History   Socioeconomic History   Marital status: Single    Spouse name: Not on file   Number of children: 2   Years of education: 14   Highest education level: Associate degree: occupational, Scientist, product/process development, or vocational program  Occupational History   Occupation: Orthoptist  Tobacco Use   Smoking status: Never   Smokeless tobacco: Never  Substance and Sexual Activity   Alcohol use: No   Drug use: No   Sexual activity: Yes    Birth control/protection: None  Other Topics Concern   Not on file  Social History Narrative   Lives with family   Caffeine use: soda sometimes   Right handed    Social Determinants of Health   Financial Resource Strain: Medium Risk (01/25/2021)    Overall Financial Resource Strain (CARDIA)    Difficulty of Paying Living Expenses: Somewhat hard  Food Insecurity: No Food Insecurity (01/25/2021)   Hunger Vital Sign    Worried About Running Out of Food in the Last Year: Never true    Ran Out of Food in the Last Year: Never true  Transportation Needs: Not on file  Physical Activity: Insufficiently Active (01/25/2021)   Exercise Vital Sign    Days of Exercise per Week: 3 days    Minutes of Exercise per Session: 30 min  Stress: Stress Concern Present (01/25/2021)   [employer] of Occupational Health - Occupational Stress Questionnaire    Feeling of Stress : Very much  Social Connections: Moderately Isolated (01/25/2021)   Social Connection and Isolation Panel [NHANES]    Frequency of Communication with Friends and Family: More than three times a week    Frequency of Social Gatherings with Friends and Family: Once a week    Attends Religious Services: Never    Database administrator or Organizations: No    Attends Engineer, structural: Not on file    Marital Status: Living with partner     Family History: The patient's family history includes Diabetes in her father; High blood pressure in her father; Hypertension in her maternal grandmother, paternal grandmother, and another family member. History of coronary  artery disease notable for multiple members. History of arrhythmia notable for no members. Denies family history of sudden cardiac death including drowning, car accidents, or unexplained deaths in the family. No history of bicuspid aortic valve or aortic aneurysm or dissection.   No history of cardiomyopathies including hypertrophic cardiomyopathy, left ventricular non-compaction, or arrhythmogenic right ventricular cardiomyopathy. No history of skeletal myopathies.  ROS:   Please see the history of present illness.     All other systems reviewed and are negative.  EKGs/Labs/Other Studies Reviewed:    The  following studies were reviewed today:  EKG:   09/11/21: SR  Cardiac Studies & Procedures         MONITORS  LONG TERM MONITOR (3-14 DAYS) 10/26/2021  Narrative   Patient had a minimum heart rate of 56 bpm, maximum heart rate of 123 bpm, and average heart rate of 74 bpm.   Predominant underlying rhythm was sinus rhythm.   Isolated PACs were rare (<1.0%).   Isolated PVCs were rare (<1.0%).   Triggered and diary events associated with sinus rhythm.  No malignant arrhythmias.            Recent Labs: 02/16/2021: ALT 27 09/09/2021: BUN 10; Creatinine, Ser 1.02; Potassium 3.7; Sodium 140 09/13/2021: Hemoglobin 12.1; Platelets 281  Recent Lipid Panel    Component Value Date/Time   CHOL 178 01/03/2021 1031   TRIG 118.0 01/03/2021 1031   HDL 51.80 01/03/2021 1031   CHOLHDL 3 01/03/2021 1031   VLDL 23.6 01/03/2021 1031   LDLCALC 102 (H) 01/03/2021 1031   LDLCALC 172 (H) 12/30/2019 0856    Physical Exam:    VS:  There were no vitals taken for this visit.    Wt Readings from Last 3 Encounters:  10/04/21 289 lb (131.1 kg)  09/13/21 292 lb 8 oz (132.7 kg)  09/12/21 290 lb 12.8 oz (131.9 kg)    GEN: Morbid Obesity no acute distress HEENT: Normal NECK: No JVD LYMPHATICS: No lymphadenopathy CARDIAC: RRR, no murmurs, rubs, gallops RESPIRATORY:  Clear to auscultation without rales, wheezing or rhonchi  ABDOMEN: Soft, non-tender, non-distended MUSCULOSKELETAL:  No edema; No deformity  SKIN: Warm and dry NEUROLOGIC:  Alert and oriented x 3 PSYCHIATRIC:  Normal affect   ASSESSMENT:    No diagnosis found.  PLAN:    Palpitations; possible SVT - will obtain 14-day non live heart monitor (ZioPatch) - AV Nodal Therapy: may offer tartrate 25 mg PO BID *** - TSH: WNL  HTN Morbid Obesity - on norvasc, start ambulatory BP - discussed exercise, will start graded exercise  OSA - failed CPAP; discussed mouthguard  HLD Hx of gestational HTN - Fasting lipids today  PRN         Medication Adjustments/Labs and Tests Ordered: Current medicines are reviewed at length with the patient today.  Concerns regarding medicines are outlined above.  No orders of the defined types were placed in this encounter.  No orders of the defined types were placed in this encounter.   There are no Patient Instructions on file for this visit.   Signed, Christell Constant, MD  01/09/2022 11:50 AM    [facility]

## 2022-01-10 ENCOUNTER — Ambulatory Visit: Payer: BC Managed Care – PPO | Admitting: Internal Medicine

## 2022-01-23 DIAGNOSIS — Z3200 Encounter for pregnancy test, result unknown: Secondary | ICD-10-CM | POA: Diagnosis not present

## 2022-02-01 ENCOUNTER — Ambulatory Visit: Payer: BC Managed Care – PPO | Admitting: Internal Medicine

## 2022-03-09 ENCOUNTER — Ambulatory Visit: Payer: BC Managed Care – PPO | Admitting: Internal Medicine

## 2022-03-20 ENCOUNTER — Encounter: Payer: Self-pay | Admitting: Internal Medicine

## 2022-03-20 ENCOUNTER — Ambulatory Visit: Payer: BC Managed Care – PPO | Attending: Internal Medicine | Admitting: Internal Medicine

## 2022-03-20 VITALS — BP 114/78 | HR 87 | Ht 67.0 in | Wt 288.6 lb

## 2022-03-20 DIAGNOSIS — G4733 Obstructive sleep apnea (adult) (pediatric): Secondary | ICD-10-CM | POA: Diagnosis not present

## 2022-03-20 DIAGNOSIS — E782 Mixed hyperlipidemia: Secondary | ICD-10-CM

## 2022-03-20 DIAGNOSIS — I10 Essential (primary) hypertension: Secondary | ICD-10-CM | POA: Diagnosis not present

## 2022-03-20 DIAGNOSIS — Z8759 Personal history of other complications of pregnancy, childbirth and the puerperium: Secondary | ICD-10-CM | POA: Insufficient documentation

## 2022-03-20 LAB — LIPID PANEL
Chol/HDL Ratio: 4.1 ratio (ref 0.0–4.4)
Cholesterol, Total: 256 mg/dL — ABNORMAL HIGH (ref 100–199)
HDL: 63 mg/dL (ref >39)
LDL Chol Calc (NIH): 170 mg/dL — ABNORMAL HIGH (ref 0–99)
Triglycerides: 131 mg/dL (ref 0–149)
VLDL Cholesterol Cal: 23 mg/dL (ref 5–40)

## 2022-03-20 NOTE — Progress Notes (Signed)
Cardiology Office Note:    Date:  03/20/2022   ID:  Amanda Wise, DOB 19-May-1985, MRN [id number]  PCP:  Isaac Bliss, Rayford Halsted, MD   [place] Providers Cardiologist:  None     Referring MD: Isaac Bliss, Estel*   CC: Palpitations follow up  History of Present Illness:    Amanda Wise is a 37 y.o. female with a hx of HTN, gestational HTN, Morbid Obedisty, mild OSA, who presents for evaluation. 2023: No arrhythmias on heart monitor.  Patient notes that she is doing [place].   Since last visit notes that she is still working, getting ready to move . There are no interval hospital/ED visit.    Still have palpitations. Rare pinching chest pain.  No SOB/DOE and no PND/Orthopnea.  No weight gain or leg swelling.    Past Medical History:  Diagnosis Date   Anemia    Heart palpitations    Hypertension    MS (multiple sclerosis) ([place])    Pregnancy induced hypertension     Past Surgical History:  Procedure Laterality Date   NO PAST SURGERIES      Current Medications: Current Meds  Medication Sig   amLODipine (NORVASC) 5 MG tablet Take 1 tablet by mouth once daily   Siponimod Fumarate (MAYZENT) 2 MG TABS Take 1 tablet by mouth daily.     Allergies:   Patient has no known allergies.   Social History   Socioeconomic History   Marital status: Single    Spouse name: Not on file   Number of children: 2   Years of education: 14   Highest education level: Associate degree: occupational, Hotel manager, or vocational program  Occupational History   Occupation: Conservator, museum/gallery  Tobacco Use   Smoking status: Never   Smokeless tobacco: Never  Substance and Sexual Activity   Alcohol use: No   Drug use: No   Sexual activity: Yes    Birth control/protection: None  Other Topics Concern   Not on file  Social History Narrative   Lives with family   Caffeine use: soda sometimes   Right handed    Social Determinants of Health    Financial Resource Strain: Medium Risk (01/25/2021)   Overall Financial Resource Strain (CARDIA)    Difficulty of Paying Living Expenses: Somewhat hard  Food Insecurity: No Food Insecurity (01/25/2021)   Hunger Vital Sign    Worried About Running Out of Food in the Last Year: Never true    Ran Out of Food in the Last Year: Never true  Transportation Needs: Not on file  Physical Activity: Insufficiently Active (01/25/2021)   Exercise Vital Sign    Days of Exercise per Week: 3 days    Minutes of Exercise per Session: 30 min  Stress: Stress Concern Present (01/25/2021)   [place]    Feeling of Stress : Very much  Social Connections: Moderately Isolated (01/25/2021)   Social Connection and Isolation Panel [NHANES]    Frequency of Communication with Friends and Family: More than three times a week    Frequency of Social Gatherings with Friends and Family: Once a week    Attends Religious Services: Never    Marine scientist or Organizations: No    Attends Music therapist: Not on file    Marital Status: Living with partner     Family History: The patient's family history includes Diabetes in her father; High blood pressure in her  father; Hypertension in her maternal grandmother, paternal grandmother, and another family member. History of coronary artery disease notable for multiple members. History of arrhythmia notable for no members. Denies family history of sudden cardiac death including drowning, car accidents, or unexplained deaths in the family. No history of bicuspid aortic valve or aortic aneurysm or dissection.   No history of cardiomyopathies including hypertrophic cardiomyopathy, left ventricular non-compaction, or arrhythmogenic right ventricular cardiomyopathy. No history of skeletal myopathies.  ROS:   Please see the history of present illness.     All other systems reviewed and are  negative.  EKGs/Labs/Other Studies Reviewed:    The following studies were reviewed today:  EKG:   09/11/21: SR  Cardiac Studies & Procedures         MONITORS  LONG TERM MONITOR (3-14 DAYS) 10/26/2021  Narrative   Patient had a minimum heart rate of 56 bpm, maximum heart rate of 123 bpm, and average heart rate of 74 bpm.   Predominant underlying rhythm was sinus rhythm.   Isolated PACs were rare (<1.0%).   Isolated PVCs were rare (<1.0%).   Triggered and diary events associated with sinus rhythm.  No malignant arrhythmias.            Recent Labs: 09/09/2021: BUN 10; Creatinine, Ser 1.02; Potassium 3.7; Sodium 140 09/13/2021: Hemoglobin 12.1; Platelets 281  Recent Lipid Panel    Component Value Date/Time   CHOL 178 01/03/2021 1031   TRIG 118.0 01/03/2021 1031   HDL 51.80 01/03/2021 1031   CHOLHDL 3 01/03/2021 1031   VLDL 23.6 01/03/2021 1031   LDLCALC 102 (H) 01/03/2021 1031   LDLCALC 172 (H) 12/30/2019 0856    Physical Exam:    VS:  BP 114/78   Pulse 87   Ht 5' 7"$  (1.702 m)   Wt 288 lb 9.6 oz (130.9 kg)   LMP 03/01/2022   SpO2 99%   BMI 45.20 kg/m     Wt Readings from Last 3 Encounters:  03/20/22 288 lb 9.6 oz (130.9 kg)  10/04/21 289 lb (131.1 kg)  09/13/21 292 lb 8 oz (132.7 kg)    GEN: no acute distress morbid obesity HEENT: Normal NECK: No JVD CARDIAC: RRR, no murmurs, rubs, gallops RESPIRATORY:  Clear to auscultation without rales, wheezing or rhonchi  ABDOMEN: Soft, non-tender, non-distended MUSCULOSKELETAL:  No edema; No deformity  SKIN: Warm and dry NEUROLOGIC:  Alert and oriented x 3 PSYCHIATRIC:  Normal affect   ASSESSMENT:    1. Mixed hyperlipidemia   2. OSA (obstructive sleep apnea)   3. Essential hypertension   4. History of gestational hypertension     PLAN:    HLD Hx of gestational HTN - fasting lipids for risk stratification  Non Cardiac Palpitations - no evidence of arrhythmia  HTN Morbid Obesity - on norvasc 40m -  discussed dietary and lifestyle interventions - no history of MEN2a or medullary thyroid cancer - no history of pancreatitis or gallstones. - BMI is 30+  with weight related comorbidities including HTN, OSA, and HLD - Weight loss of < 5% over 3-6 months  (< 5%) with comprehensive lifestyle interventions; they have been limited due to her fatigue  - would meet criteria for GLP-1 based therap; sending to Pharm D for initiation of therapy  OSA - failed CPAP - pending discussion per her primary sleep provider  PRN with me        Medication Adjustments/Labs and Tests Ordered: Current medicines are reviewed at length with the patient today.  Concerns regarding medicines are outlined above.  No orders of the defined types were placed in this encounter.  No orders of the defined types were placed in this encounter.   There are no Patient Instructions on file for this visit.   Signed, Werner Lean, MD  03/20/2022 10:02 AM    [place]

## 2022-03-20 NOTE — Patient Instructions (Signed)
Medication Instructions:  Your physician recommends that you continue on your current medications as directed. Please refer to the Current Medication list given to you today.  *If you need a refill on your cardiac medications before your next appointment, please call your pharmacy*   Lab Work: Fasting Lipids today  If you have labs (blood work) drawn today and your tests are completely normal, you will receive your results only by: [place] (if you have MyChart) OR A paper copy in the mail If you have any lab test that is abnormal or we need to change your treatment, we will call you to review the results.   Testing/Procedures: None ordered.    Follow-Up: At [facility], you and your health needs are our priority.  As part of our continuing mission to provide you with exceptional heart care, we have created designated Provider Care Teams.  These Care Teams include your primary Cardiologist (physician) and Advanced Practice Providers (APPs -  Physician Assistants and Nurse Practitioners) who all work together to provide you with the care you need, when you need it.  We recommend signing up for the patient portal called "MyChart".  Sign up information is provided on this After Visit Summary.  MyChart is used to connect with patients for Virtual Visits (Telemedicine).  Patients are able to view lab/test results, encounter notes, upcoming appointments, etc.  Non-urgent messages can be sent to your provider as well.   To learn more about what you can do with MyChart, go to [email or web address].    Your next appointment:   Pt will need an appointment with PharmD for GLP-1  You will follow up with Dr Gasper Sells as needed

## 2022-03-27 ENCOUNTER — Ambulatory Visit: Payer: BC Managed Care – PPO | Admitting: Neurology

## 2022-04-03 ENCOUNTER — Other Ambulatory Visit: Payer: Self-pay | Admitting: Internal Medicine

## 2022-04-03 ENCOUNTER — Ambulatory Visit (INDEPENDENT_AMBULATORY_CARE_PROVIDER_SITE_OTHER): Payer: BC Managed Care – PPO | Admitting: Neurology

## 2022-04-03 ENCOUNTER — Encounter: Payer: Self-pay | Admitting: Neurology

## 2022-04-03 VITALS — BP 146/96 | HR 84 | Ht 67.0 in | Wt 285.5 lb

## 2022-04-03 DIAGNOSIS — I10 Essential (primary) hypertension: Secondary | ICD-10-CM

## 2022-04-03 DIAGNOSIS — Z79899 Other long term (current) drug therapy: Secondary | ICD-10-CM

## 2022-04-03 DIAGNOSIS — G35 Multiple sclerosis: Secondary | ICD-10-CM

## 2022-04-03 MED ORDER — MODAFINIL 200 MG PO TABS: 200.0000 mg | ORAL_TABLET | Freq: Every day | ORAL | 5 refills | Status: DC

## 2022-04-03 NOTE — Progress Notes (Signed)
[facility]  PATIENT: Amanda Wise DOB: May 30, 1985  REFERRING DOCTOR OR PCP: Roland Rack SOURCE: Patient, notes from emergency room and hospital admission, laboratory reports, imaging reports, MRI images personally reviewed.  _________________________________   HISTORICAL  CHIEF COMPLAINT:  Chief Complaint  Patient presents with   Follow-up    Rm 1, alone. Here for 6 month MS f/u, on Mayzent and tolerating well. Pt reports having dizziness for the last weeks. Was seen at the ED and had MRI done.     HISTORY OF PRESENT ILLNESS:  Amanda Wise is a 37 y.o. woman who was diagnosed with multiple sclerosis in December 2019.  Update 04/03/2022 She is more compliant with the [place] - in 03/2021 she had 4-5 new lesions including an enhancing focus in the brain an one new focus in her cervical spine compared to previous MRIs.  Of note, she had missed a lot of doses.       She is walking ok but tired out more easily than she used to.  She could easily do a mile but not at the same pace as she did a couple years ago.   She has been using the bannister on stairs.   Strength is doing well.  She denies numbness now..     Right facial myokymia resolved (ha at onset).      Vision is baseline and colors are symmetric.   Vision used to be a little better.   Her bladder is doing well.    Fatigue is a problem some days...   Heat makes this wore     She has fairly mild cognitive issues - forgetful, reduced focus/attention and some word finding issues..   Although it did help fatigue, she stopped the Adderall was stopped due to increased BP.    She does not recall being on Provigil though it was on her list in 2021.     She has OSA, (mild overall but severe during REM).  She felt much better on CPAP but insurance did not cover it well and she opted not to stay  on CPAP.    No depression or anxiety  EPWORTH SLEEPINESS SCALE  On a scale of 0 - 3 what is the chance of  dozing:  Sitting and Reading:   2 Watching TV:    3 Sitting inactive in a public place: 1 Passenger in car for one hour: 3 Lying down to rest in the afternoon: 2 Sitting and talking to someone: 0 Sitting quietly after lunch:  1 In a car, stopped in traffic:  0  Total (out of 24):   12/24   Mild EDS     MS HISTORY: She had an episode of bilateral leg weakness in 2012, she improved over 4-5 months.  She felt symptoms were due to working out.  In 2016, she had the onset of poor balance x 3-6 months that completely resolved.    She never sought out medical/neurologic care.  In August 2019, she had some weakness and numbness in her legs for a couple weeks but then improved after she saw a Restaurant manager, fast food.    In December 2019,  she had the onset of tingling in both hands and right hand weakness.   She went to the ED.  She had MRI of the brain and MRI of the cervical spine and they were consistent with MS.  She was admitted and received 5 days of IV Solu-Medrol.   She felt better by her  discharge, closer to her baseline.   She went on Tysabri initially, but due to cost switched to a siponimod drug study in 2022.   She has been on siponimod/Mayzent since January 2021.   She had a relapse with acute MRI changes 03/2021.  She was noncompliant at the time.  IMAGING: MRI of the brain and cervical spine dated 01/30/2018.  The brain shows multiple T2/flair hyperintense foci in the hemispheres in the periventricular, juxtacortical and deep white matter.  There are 2 foci peripherally to the right in the pons and another focus in the left middle cerebellar peduncle.  The one pontine focus enhances.  2 small foci in the hemispheres enhance.  The MRI of the cervical spine shows several foci in the spinal cord including 1 at C3 to the left and 1 at C6-C7 on the right that enhance.    The other foci located at C2-C3 posteriorly and C3 posteriorly to the right.  MRI of the brain 05/23/2019 showed multiple T2/FLAIR  hyperintense foci in the brainstem, cerebellum and cerebral hemispheres in a pattern and configuration consistent with chronic demyelinating plaque associated with multiple sclerosis. None of the foci appear to be acute. Compared to the MRI from 01/30/2018, there are no new lesions. Several foci that were enhancing in 2019 no longer do so.   MRI 03/24/2021 showed "  Multiple T2/FLAIR hyperintense foci in the brainstem, cervical cerebellum, spinal cord and cerebral hemispheres in a pattern consistent with demyelinating plaque associated with multiple sclerosis.  1 focus in the left parietal lobe enhances consistent with an acute demyelinating plaque.  Additionally, there are 4 chronic plaques in the hemispheres seen on the current MRI that were not present on the MRI from 05/23/2019 consistent with plaques developing during the interim.".  I called her to discuss changing DMT and she reported that compliance was poor .   She has since been very comliant with the [place].     MRI cervical spine 03/08/2021 showed  Four T2 hyperintense foci within the spinal cord adjacent to C2-C3, C3-C4, C4 and C6-C7.  The foci at C2-C3, C4 C6-C7 were clearly present on the previous MRI and the C2-C3 and C6-C7 foci were larger and enhanced at that time.  The focus adjacent to C3-C4 posterolaterally to the right was not clearly present on the previous MRI and could represent interval development of an additional plaque.  1 focus adjacent to C4-C5 to the right on the 01/30/2018 MRI was not clearly present on the current MRI  MRI of the brain 09/2021 showed no new lesions.  Other studies: Polysomnography 04/23/2018 showed mild overall AHI (10.6) that was severe during REM sleep (REM AHI equals 37.9).  There is no significant restless leg syndrome.   She tolerated CPAP (APAP) but had high copays.     REVIEW OF SYSTEMS: Constitutional: No fevers, chills, sweats, or change in appetite Eyes: No visual changes, double vision, eye  pain Ear, nose and throat: No hearing loss, ear pain, nasal congestion, sore throat Cardiovascular: No chest pain, palpitations Respiratory:  No shortness of breath at rest or with exertion.   No wheezes GastrointestinaI: No nausea, vomiting, diarrhea, abdominal pain, fecal incontinence Genitourinary:  No dysuria, urinary retention or frequency.  No nocturia. Musculoskeletal:  No neck pain, back pain Integumentary: No rash, pruritus, skin lesions Neurological: as above Psychiatric: No depression at this time.  No anxiety Endocrine: No palpitations, diaphoresis, change in appetite, change in weigh or increased thirst Hematologic/Lymphatic:  No anemia, purpura,  petechiae. Allergic/Immunologic: No itchy/runny eyes, nasal congestion, recent allergic reactions, rashes  ALLERGIES: No Known Allergies  HOME MEDICATIONS:  Current Outpatient Medications:    amLODipine (NORVASC) 5 MG tablet, Take 1 tablet (5 mg total) by mouth daily., Disp: 90 tablet, Rfl: 1   Siponimod Fumarate (MAYZENT) 2 MG TABS, Take 1 tablet by mouth daily., Disp: 90 tablet, Rfl: 3  PAST MEDICAL HISTORY: Past Medical History:  Diagnosis Date   Anemia    Heart palpitations    Hypertension    MS (multiple sclerosis) (HCC)    Pregnancy induced hypertension     PAST SURGICAL HISTORY: Past Surgical History:  Procedure Laterality Date   NO PAST SURGERIES      FAMILY HISTORY: Family History  Problem Relation Age of Onset   Hypertension Other    Hypertension Maternal Grandmother    Hypertension Paternal Grandmother    Diabetes Father    High blood pressure Father     SOCIAL HISTORY:  Social History   Socioeconomic History   Marital status: Single    Spouse name: Not on file   Number of children: 2   Years of education: 14   Highest education level: Associate degree: occupational, Hotel manager, or vocational program  Occupational History   Occupation: Conservator, museum/gallery  Tobacco Use   Smoking status:  Never   Smokeless tobacco: Never  Substance and Sexual Activity   Alcohol use: No   Drug use: No   Sexual activity: Yes    Birth control/protection: None  Other Topics Concern   Not on file  Social History Narrative   Lives with family   Caffeine use: soda sometimes   Right handed    Social Determinants of Health   Financial Resource Strain: Medium Risk (01/25/2021)   Overall Financial Resource Strain (CARDIA)    Difficulty of Paying Living Expenses: Somewhat hard  Food Insecurity: No Food Insecurity (01/25/2021)   Hunger Vital Sign    Worried About Running Out of Food in the Last Year: Never true    Ran Out of Food in the Last Year: Never true  Transportation Needs: Not on file  Physical Activity: Insufficiently Active (01/25/2021)   Exercise Vital Sign    Days of Exercise per Week: 3 days    Minutes of Exercise per Session: 30 min  Stress: Stress Concern Present (01/25/2021)   [employer] [place]    Feeling of Stress : Very much  Social Connections: Moderately Isolated (01/25/2021)   Social Connection and Isolation Panel [NHANES]    Frequency of Communication with Friends and Family: More than three times a week    Frequency of Social Gatherings with Friends and Family: Once a week    Attends Religious Services: Never    Marine scientist or Organizations: No    Attends Music therapist: Not on file    Marital Status: Living with partner  Intimate Partner Violence: Not on file     PHYSICAL EXAM  Vitals:   09/13/21 0810  BP: 128/83  Pulse: 72  Weight: 292 lb 8 oz (132.7 kg)  Height: '5\' 7"'$  (1.702 m)     Body mass index is 45.81 kg/m.  No results found.   General: The patient is well-developed and well-nourished and in no acute distress.  Abdomen is nontender with normal sounds.  HEENT: Tympanic membranes were intact on the right.  There is a fair amount of wax in the left external  ear canal  and I could not clearly see the tympanic membrane.  Funduscopic exam shows normal optic discs and retinal vessels.   Skin: Extremities are without rash or edema.   Neurologic Exam  Mental status: The patient is alert and oriented x 3 at the time of the examination. The patient has apparent normal recent and remote memory, with an apparently normal attention span and concentration ability.   Speech is normal.  Cranial nerves: Extraocular movements are full.  She has a mild right APD.  Color vision was symmetric, however.  Facial strength and sensation was normal.  There was no myokymia.  Although hearing was symmetric to the tuning fork, the Weber lateralized towards the left.  Motor:  Muscle bulk is normal.   Tone is normal. Strength is  5 / 5 in all 4 extremities.   Sensory: She has intact sensation to touch and vibration in the arms and legs.  Coordination: Cerebellar testing reveals good finger-nose-finger and heel-to-shin bilaterally.  Gait and station: Station is normal.   Gait is normal.  Tandem gait is mildly wide.  Romberg is negative.  Reflexes: Deep tendon reflexes are symmetric and normal in arms and increased in legs without clonus.       DIAGNOSTIC DATA (LABS, IMAGING, TESTING) - I reviewed patient records, labs, notes, testing and imaging myself where available.  Lab Results  Component Value Date   WBC 4.1 09/09/2021   HGB 13.2 09/09/2021   HCT 38.5 09/09/2021   MCV 89.1 09/09/2021   PLT 301 09/09/2021      Component Value Date/Time   NA 140 09/09/2021 0216   K 3.7 09/09/2021 0216   CL 106 09/09/2021 0216   CO2 24 09/09/2021 0216   GLUCOSE 93 09/09/2021 0216   BUN 10 09/09/2021 0216   CREATININE 1.02 (H) 09/09/2021 0216   CREATININE 1.06 (H) 02/16/2021 0856   CALCIUM 9.0 09/09/2021 0216   PROT 6.7 02/16/2021 0856   PROT 6.9 09/01/2020 1601   ALBUMIN 4.0 01/03/2021 1031   ALBUMIN 4.1 09/01/2020 1601   AST 18 02/16/2021 0856   ALT 27 02/16/2021  0856   ALKPHOS 73 01/03/2021 1031   BILITOT 0.4 02/16/2021 0856   BILITOT 0.3 09/01/2020 1601   GFRNONAA >60 09/09/2021 0216   GFRAA >60 12/22/2018 0240     ____________________________________________________________   1. Multiple sclerosis (HCC)   2. Other fatigue   3. High risk medication use   4. Neurologic gait dysfunction   5. OSA (obstructive sleep apnea)   6. Benign paroxysmal positional vertigo of right ear      1.  Continue Mayzent (activity had occurred while non-compilant).  If a relapse occurs or she has trouble with a daily po med go back on Tysabri or consider an anti-CD20 agent.    2.    Stay active and exercise as tolerated. 3.    She has been referred to medical weight loss and hopefully will be able to lose some weight which will help her OSA and other symptoms 4.    Provigil for fatigue 5.   Supplement Vit D (was 17 in past) 6.   rtc 6 months or sooner if new or worsening neurologic issues.  Everet Flagg A. Felecia Shelling, MD, [facility] [id number], [id number] AM Certified in Neurology, Clinical Neurophysiology, Sleep Medicine, Pain Medicine and Neuroimaging  [facility] [address], [place],  [address] [phone]

## 2022-04-04 LAB — CBC WITH DIFFERENTIAL/PLATELET
Basophils Absolute: 0 10*3/uL (ref 0.0–0.2)
Basos: 1 %
EOS (ABSOLUTE): 0.2 10*3/uL (ref 0.0–0.4)
Eos: 5 %
Hematocrit: 36.4 % (ref 34.0–46.6)
Hemoglobin: 12.2 g/dL (ref 11.1–15.9)
Immature Grans (Abs): 0 10*3/uL (ref 0.0–0.1)
Immature Granulocytes: 0 %
Lymphocytes Absolute: 0.3 10*3/uL — ABNORMAL LOW (ref 0.7–3.1)
Lymphs: 7 %
MCH: 30.5 pg (ref 26.6–33.0)
MCHC: 33.5 g/dL (ref 31.5–35.7)
MCV: 91 fL (ref 79–97)
Monocytes Absolute: 0.7 10*3/uL (ref 0.1–0.9)
Monocytes: 18 %
Neutrophils Absolute: 2.6 10*3/uL (ref 1.4–7.0)
Neutrophils: 69 %
Platelets: 259 10*3/uL (ref 150–450)
RBC: 4 x10E6/uL (ref 3.77–5.28)
RDW: 12.9 % (ref 11.7–15.4)
WBC: 3.8 10*3/uL (ref 3.4–10.8)

## 2022-04-04 LAB — HEPATIC FUNCTION PANEL
ALT: 20 IU/L (ref 0–32)
AST: 15 IU/L (ref 0–40)
Albumin: 4.1 g/dL (ref 3.9–4.9)
Alkaline Phosphatase: 58 IU/L (ref 44–121)
Bilirubin Total: 0.4 mg/dL (ref 0.0–1.2)
Bilirubin, Direct: 0.11 mg/dL (ref 0.00–0.40)
Total Protein: 6.7 g/dL (ref 6.0–8.5)

## 2022-04-12 ENCOUNTER — Other Ambulatory Visit: Payer: Self-pay

## 2022-04-12 DIAGNOSIS — G35 Multiple sclerosis: Secondary | ICD-10-CM

## 2022-04-12 MED ORDER — MAYZENT 2 MG PO TABS: 1.0000 | ORAL_TABLET | Freq: Every day | ORAL | 3 refills | Status: DC

## 2022-05-01 ENCOUNTER — Ambulatory Visit: Payer: BC Managed Care – PPO | Attending: Cardiology | Admitting: Pharmacist

## 2022-05-01 NOTE — Patient Instructions (Signed)
[email or web address]  GLP-1 Receptor Agonist Counseling Points This medication reduces your appetite and may make you feel fuller longer.  Stop eating when your body tells you that you are full. This will likely happen sooner than you are used to. Fried/greasy food and sweets may upset your stomach - minimize these as much as possible. Store your medication in the fridge until you are ready to use it. Inject your medication in the fatty tissue of your lower abdominal area (2 inches away from belly button) or upper outer thigh. Rotate injection sites. Common side effects include: nausea, diarrhea/constipation, and heartburn, and are more likely to occur if you overeat. Stop your injection for 7 days prior to surgical procedures requiring anesthesia.   Tips for success: Write down the reasons why you want to lose weight and post it in a place where you'll see it often.  Start small and work your way up. Keep in mind that it takes time to achieve goals, and small steps add up.  Any additional movements help to burn calories. Taking the stairs rather than the elevator and parking at the far end of your parking lot are easy ways to start. Brisk walking for at least 30 minutes 4 or more days of the week is an excellent goal to work toward  Make a menu for the week. Meal prep lunches.  Understanding what it means to feel full: Did you know that it can take 15 minutes or more for your brain to receive the message that you've eaten? That means that, if you eat less food, but consume it slower, you may still feel satisfied.  Eating a lot of fruits and vegetables can also help you feel fuller.  Eat off of smaller plates so that moderate portions don't seem too small  Tips for living a healthier life     Building a Healthy and Balanced Diet Make most of your meal vegetables and fruits -  of your plate. Aim for color and variety, and remember that potatoes don't count as  vegetables on the Healthy Eating Plate because of their negative impact on blood sugar.  Go for whole grains -  of your plate. Whole and intact grains--whole wheat, barley, wheat berries, quinoa, oats, brown rice, and foods made with them, such as whole wheat pasta--have a milder effect on blood sugar and insulin than white bread, white rice, and other refined grains.  Protein power -  of your plate. Fish, poultry, beans, and nuts are all healthy, versatile protein sources--they can be mixed into salads, and pair well with vegetables on a plate. Limit red meat, and avoid processed meats such as bacon and sausage.  Healthy plant oils - in moderation. Choose healthy vegetable oils like olive, canola, soy, corn, sunflower, peanut, and others, and avoid partially hydrogenated oils, which contain unhealthy trans fats. Remember that low-fat does not mean "healthy."  Drink water, coffee, or tea. Skip sugary drinks, limit milk and dairy products to one to two servings per day, and limit juice to a small glass per day.  Stay active. The red figure running across the [place] is a reminder that staying active is also important in weight control.  The main message of the Healthy Eating Plate is to focus on diet quality:  The type of carbohydrate in the diet is more important than the amount of carbohydrate in the diet, because some sources of carbohydrate--like vegetables (other than potatoes), fruits, whole grains, and beans--are healthier than others.  The Healthy Eating Plate also advises consumers to avoid sugary beverages, a major source of calories--usually with little nutritional value--in the American diet. The Healthy Eating Plate encourages consumers to use healthy oils, and it does not set a maximum on the percentage of calories people should get each day from healthy sources of fat. In this way, the Healthy Eating Plate recommends the opposite of the low-fat message  promoted for decades by the USDA.  DeskDistributor.no  SUGAR  Sugar is a huge problem in the modern day diet. Sugar is a big contributor to heart disease, diabetes, high triglyceride levels, fatty liver disease and obesity. Sugar is hidden in almost all packaged foods/beverages. Added sugar is extra sugar that is added beyond what is naturally found and has no nutritional benefit for your body. The American Heart Association recommends limiting added sugars to no more than 25g for women and 36 grams for men per day. There are many names for sugar including maltose, sucrose (names ending in "ose"), high fructose corn syrup, molasses, cane sugar, corn sweetener, raw sugar, syrup, honey or fruit juice concentrate.   One of the best ways to limit your added sugars is to stop drinking sweetened beverages such as soda, sweet tea, and fruit juice.  There is 65g of added sugars in one 20oz bottle of Coke! That is equal to 7.5 donuts.   Pay attention and read all nutrition facts labels. Below is an examples of a nutrition facts label. The #1 is showing you the total sugars where the # 2 is showing you the added sugars. This one serving has almost the max amount of added sugars per day!   EXERCISE  Exercise is good. We've all heard that. In an ideal world, we would all have time and resources to get plenty of it. When you are active, your heart pumps more efficiently and you will feel better.  Multiple studies show that even walking regularly has benefits that include living a longer life. The American Heart Association recommends 150 minutes per week of exercise (30 minutes per day most days of the week). You can do this in any increment you wish. Nine or more 10-minute walks count. So does an hour-long exercise class. Break the time apart into what will work in your life. Some of the best things you can do include walking briskly, jogging, cycling or swimming  laps. Not everyone is ready to "exercise." Sometimes we need to start with just getting active. Here are some easy ways to be more active throughout the day:  Take the stairs instead of the elevator  Go for a 10-15 minute walk during your lunch break (find a friend to make it more enjoyable)  When shopping, park at the back of the parking lot  If you take public transportation, get off one stop early and walk the extra distance  Pace around while making phone calls  Check with your doctor if you aren't sure what your limitations may be. Always remember to drink plenty of water when doing any type of exercise. Don't feel like a failure if you're not getting the 90-150 minutes per week. If you started by being a couch potato, then just a 10-minute walk each day is a huge improvement. Start with little victories and work your way up.   HEALTHY EATING TIPS              Plan ahead: make a menu of the meals for a week then create a grocery  list to go with that menu. Consider meals that easily stretch into a night of leftovers, such as stews or casseroles. Or consider making two of your favorite meal and put one in the freezer for another night. Try a night or two each week that is "meatless" or "no cook" such as salads. When you get home from the grocery store wash and prepare your vegetables and fruits. Then when you need them they are ready to go.   Tips for going to the grocery store:  [place] store or generic brands  Check the weekly ad from your store on-line or in their in-store flyer  Look at the unit price on the shelf tag to compare/contrast the costs of different items  Buy fruits/vegetables in season  Carrots, bananas and apples are low-cost, naturally healthy items  If meats or frozen vegetables are on sale, buy some extras and put in your freezer  Limit buying prepared or "ready to eat" items, even if they are pre-made salads or fruit snacks  Do not shop when you're hungry  Foods at eye  level tend to be more expensive. Look on the high and low shelves for deals.  Consider shopping at the farmer's market for fresh foods in season.  Avoid the cookie and chip aisles (these are expensive, high in calories and low in nutritional value). Shop on the outside of the grocery store.  Healthy food preparations:  If you can't get lean hamburger, be sure to drain the fat when cooking  Steam, saut (in olive oil), grill or bake foods  Experiment with different seasonings to avoid adding salt to your foods. Kosher salt, sea salt and Himalayan salt are all still salt and should be avoided. Try seasoning food with onion, garlic, thyme, rosemary, basil ect. Onion powder or garlic powder is ok. Avoid if it says salt (ie garlic salt).

## 2022-05-01 NOTE — Progress Notes (Signed)
Patient ID: Amanda Wise                 DOB: April 06, 1985                    MRN: [id number]      HPI: Amanda Wise is a 37 y.o. female patient referred to pharmacy clinic by Dr. Gasper Sells to initiate weight loss therapy with GLP1-RA. PMH is significant for obesity complicated by chronic medical conditions including HTN, OSA, HLD. Most recent BMI 44.71, previous BMI 45.19.  At today's visit patient reported having some success in the past with intermittent fasting. Reported going from ~360 lbs to 290 lbs. She did not continue this due to schedule changes, children, and not being able to keep up with it. She has not gained significant weight since stopping intermittent fasting. Patient reports some barriers to losing weight include having a sedentary job and lack of motivation. Patient reports not really knowing how to cook, so she has difficulty with diet - tends to cook instant foods and gets fast food often.   Insurance payor: BC[employer] COMM PPO  Diet:  -Breakfast: biscuits (bacon, egg) x3-5 times/week  -Lunch: Mostly fast food, chickfila (fries mostly, chicken tenders) -Dinner: chicken parm, frozen bag of meat, brussels sprouts, broccoli, jasmine rice, mashed potatoes -Snacks: chips, candy -Drinks: water, zeros soda (pepsi/mountain dew, can 2/day)  Exercise:  - inconsistent exercise  - walk around neighborhood 15 minutes, ~2 times/week   Family History: The patient's family history includes Diabetes in her father; High Wise pressure in her father; Hypertension in her maternal grandmother, paternal grandmother, and another family member. History of coronary artery disease notable for multiple members.  Social History: Never smoked, no alcohol use, no drug use.   Labs: Lab Results  Component Value Date   HGBA1C 5.3 01/03/2021    Wt Readings from Last 1 Encounters:  04/03/22 285 lb 8 oz (129.5 kg)    BP Readings from Last 1 Encounters:  04/03/22 (!) 146/96    Pulse Readings from Last 1 Encounters:  04/03/22 84       Component Value Date/Time   CHOL 256 (H) 03/20/2022 1016   TRIG 131 03/20/2022 1016   HDL 63 03/20/2022 1016   CHOLHDL 4.1 03/20/2022 1016   CHOLHDL 3 01/03/2021 1031   VLDL 23.6 01/03/2021 1031   LDLCALC 170 (H) 03/20/2022 1016   LDLCALC 172 (H) 12/30/2019 0856    Past Medical History:  Diagnosis Date   Anemia    Heart palpitations    Hypertension    MS (multiple sclerosis) ([place])    Pregnancy induced hypertension     Current Outpatient Medications on File Prior to Visit  Medication Sig Dispense Refill   amLODipine (NORVASC) 5 MG tablet Take 1 tablet by mouth once daily 90 tablet 1   modafinil (PROVIGIL) 200 MG tablet Take 1 tablet (200 mg total) by mouth daily. 30 tablet 5   Siponimod Fumarate (MAYZENT) 2 MG TABS Take 1 tablet (2 mg total) by mouth daily. 90 tablet 3   No current facility-administered medications on file prior to visit.    No Known Allergies   Assessment/Plan:  1. Weight loss - Provided patient with website ([email or web address]) with simple recipes for healthy eating. Encouraged patient to start meal prepping, increase fiber and protein, and try to avoid eating out. Encouraged patient to increase exercise to ~20 min/3 days week and continue to increase as able.   Patient has not met  goal of at least 5% of body weight loss with comprehensive lifestyle modifications alone in the past 3-6 months. Pharmacotherapy is appropriate to pursue as augmentation. Sent PA for Zepbound. Confirmed patient is not pregnant and no personal or family history of medullary thyroid carcinoma (MTC) or Multiple Endocrine Neoplasia syndrome type 2 (MEN 2). Confirmed no history of pancreatitis or gallstones.  Advised patient on common side effects including nausea, diarrhea, dyspepsia, decreased appetite, and fatigue. Counseled patient on reducing meal size and how to titrate medication to minimize side effects.  Counseled patient to call if intolerable side effects or if experiencing dehydration, abdominal pain, or dizziness.    Candiss Norse PharmD Candidate Class of 2024  [place], [place].D, BCPS, CPP [facility] [address], [place], [place] [address]  Phone: [phone]; Fax: [phone]

## 2022-05-02 ENCOUNTER — Telehealth: Payer: Self-pay

## 2022-05-02 MED ORDER — ZEPBOUND 2.5 MG/0.5ML ~~LOC~~ SOAJ: 2.5000 mg | SUBCUTANEOUS | 0 refills | Status: DC

## 2022-05-02 NOTE — Telephone Encounter (Signed)
Patient was informed Zepbound PA was approved by insurance. Zepbound 2.5mg  will be sent to her pharmacy. Will plan to follow-up in ~4 weeks for potential dose titration.

## 2022-05-14 MED ORDER — ZEPBOUND 5 MG/0.5ML ~~LOC~~ SOAJ: 5.0000 mg | SUBCUTANEOUS | 0 refills | Status: DC

## 2022-05-14 NOTE — Telephone Encounter (Signed)
Patient called for refill on Zepbound. Has tolerated the first 2 shots on 2.5mg . Will send in Rx for 5mg .

## 2022-05-24 DIAGNOSIS — R051 Acute cough: Secondary | ICD-10-CM | POA: Diagnosis not present

## 2022-05-24 DIAGNOSIS — J0110 Acute frontal sinusitis, unspecified: Secondary | ICD-10-CM | POA: Diagnosis not present

## 2022-05-24 DIAGNOSIS — I10 Essential (primary) hypertension: Secondary | ICD-10-CM | POA: Diagnosis not present

## 2022-05-28 ENCOUNTER — Other Ambulatory Visit: Payer: Self-pay | Admitting: Internal Medicine

## 2022-06-21 NOTE — Telephone Encounter (Signed)
Called pt to f/u on Zepbound tolerance. LVM for patient to call back. She should be due for a dose increase or new rx.  Will need to f/u on of patient has made any changes to her diet and increased physical activity.

## 2022-06-28 NOTE — Telephone Encounter (Signed)
Patient is returning call and is requesting call back to discuss alternatives to this medication due to it being on back order. Please advise.

## 2022-06-29 MED ORDER — ZEPBOUND 2.5 MG/0.5ML ~~LOC~~ SOAJ: 2.5000 mg | SUBCUTANEOUS | 1 refills | Status: DC

## 2022-06-29 NOTE — Telephone Encounter (Signed)
Patient called back.  She has been out of Zepbound since the end of March or April because she has not been able to find it.  Asking if she can go back on the 2.5 mg.  I have sent in a refill for 2.5 mg to [facility] per her request.  I have asked her to call me in 3 weeks so that we can discuss increasing the dose.

## 2022-06-29 NOTE — Addendum Note (Signed)
Addended by: Malena Peer D on: 06/29/2022 04:46 PM   Modules accepted: Orders

## 2022-07-16 ENCOUNTER — Ambulatory Visit: Payer: BC Managed Care – PPO | Admitting: Internal Medicine

## 2022-07-17 ENCOUNTER — Ambulatory Visit: Payer: BC Managed Care – PPO | Admitting: Internal Medicine

## 2022-07-19 ENCOUNTER — Telehealth: Payer: Self-pay | Admitting: *Deleted

## 2022-07-19 ENCOUNTER — Telehealth: Payer: Self-pay | Admitting: Internal Medicine

## 2022-07-19 NOTE — Telephone Encounter (Signed)
Faxed completed/signed PA Mayzent 2mg  to [facility] at [phone]. Received fax confirmation. Waiting on determination.

## 2022-07-19 NOTE — Telephone Encounter (Signed)
*  STAT* If patient is at the pharmacy, call can be transferred to refill team.   1. Which medications need to be refilled? (please list name of each medication and dose if known)    tirzepatide (ZEPBOUND) 2.5 MG/0.5ML Pen     Pt called in for a refill but she was told to contact Melissa, RPH-CPP to increase dosage to 5mL. Please advise.     2. Which pharmacy/location (including street and city if local pharmacy) is medication to be sent to?   [id number] - [place], [place] - [address]    3. Do they need a 30 day or 90 day supply? 30

## 2022-07-20 MED ORDER — ZEPBOUND 5 MG/0.5ML ~~LOC~~ SOAJ
5.0000 mg | SUBCUTANEOUS | 0 refills | Status: DC
Start: 2022-07-20 — End: 2022-08-13

## 2022-07-20 MED ORDER — ZEPBOUND 5 MG/0.5ML ~~LOC~~ SOAJ
5.0000 mg | SUBCUTANEOUS | 0 refills | Status: DC
Start: 2022-07-20 — End: 2022-07-20

## 2022-07-20 NOTE — Telephone Encounter (Signed)
Called pt and Amanda Wise. Calling to to see how she tolerated 2.5mg  and if she would like to increase to 5mg .

## 2022-07-20 NOTE — Telephone Encounter (Signed)
Spoke with patient. Rx for Zepbound 5mg  sent to pahrmacy.

## 2022-07-20 NOTE — Addendum Note (Signed)
Addended by: Malena Peer D on: 07/20/2022 10:40 AM   Modules accepted: Orders

## 2022-07-23 NOTE — Telephone Encounter (Signed)
Received fax from [facility] that PA approved 07/19/22-07/19/23. PA# [facility] [id number]

## 2022-08-13 MED ORDER — ZEPBOUND 5 MG/0.5ML ~~LOC~~ SOAJ
5.0000 mg | SUBCUTANEOUS | 1 refills | Status: DC
Start: 2022-08-13 — End: 2022-09-11

## 2022-08-13 NOTE — Telephone Encounter (Signed)
Called pt to see how she is doing on Zepbound 5mg . She is doing well, supressing her appetitie. Hasn't lost much weight, but hasn't gained any. She is walking some but hasn't been consistent with her workouts. Is hoping to get more consistent. Is having some constipation. Recommended miralax or metamucil. Will stay on 5mg  and f/u in 1 month.

## 2022-08-13 NOTE — Addendum Note (Signed)
Addended by: Malena Peer D on: 08/13/2022 09:44 AM   Modules accepted: Orders

## 2022-09-11 ENCOUNTER — Telehealth: Payer: Self-pay | Admitting: Pharmacist

## 2022-09-11 MED ORDER — ZEPBOUND 7.5 MG/0.5ML ~~LOC~~ SOAJ: 7.5000 mg | SUBCUTANEOUS | 0 refills | Status: DC

## 2022-09-11 NOTE — Telephone Encounter (Signed)
Spoke to patient. Doing well on Zepbound 5mg . Constipation a little better. Just started miralax. Is suppressing appetite. Weight has stalled the last 2 weeks. Would like to increase to 7.5mg . Will send in Rx and f/u in a month.

## 2022-10-02 ENCOUNTER — Encounter: Payer: Self-pay | Admitting: Neurology

## 2022-10-02 ENCOUNTER — Telehealth: Payer: Self-pay

## 2022-10-02 ENCOUNTER — Ambulatory Visit (INDEPENDENT_AMBULATORY_CARE_PROVIDER_SITE_OTHER): Payer: BC Managed Care – PPO | Admitting: Neurology

## 2022-10-02 VITALS — BP 107/73 | HR 93 | Ht 67.0 in | Wt 260.0 lb

## 2022-10-02 DIAGNOSIS — G35 Multiple sclerosis: Secondary | ICD-10-CM

## 2022-10-02 DIAGNOSIS — Z79899 Other long term (current) drug therapy: Secondary | ICD-10-CM | POA: Diagnosis not present

## 2022-10-02 DIAGNOSIS — G4733 Obstructive sleep apnea (adult) (pediatric): Secondary | ICD-10-CM

## 2022-10-02 DIAGNOSIS — R269 Unspecified abnormalities of gait and mobility: Secondary | ICD-10-CM | POA: Diagnosis not present

## 2022-10-02 MED ORDER — VITAMIN D (ERGOCALCIFEROL) 1.25 MG (50000 UNIT) PO CAPS: 50000.0000 [IU] | ORAL_CAPSULE | ORAL | 1 refills | Status: DC

## 2022-10-02 NOTE — Progress Notes (Signed)
[facility]  PATIENT: Amanda Wise DOB: 1985/03/12  REFERRING DOCTOR OR PCP: Ritta Slot SOURCE: Patient, notes from emergency room and hospital admission, laboratory reports, imaging reports, MRI images personally reviewed.  _________________________________   HISTORICAL  CHIEF COMPLAINT:  Chief Complaint  Patient presents with   Follow-up    Pt in room 10. Here for MS follow up. Pt reports doing okay from MS standpoint, reports memory is kinda foggy at times. Pt said she forgets to take Mayzent at times, want to get back tysabri infusion.    HISTORY OF PRESENT ILLNESS:  Amanda Wise is a 37 y.o. woman who was diagnosed with multiple sclerosis in December 2019.  Update 10/02/2022 She is trying to be compliant with the Mayzent but still misses about 4/month.  This was more of a problem last year and in 03/2021 she had 4-5 new lesions including an enhancing focus in the brain an one new focus in her cervical spine compared to previous MRIs.  Of note, she had missed a lot of dosesm in 2023.       In March 2024, despite improved compliance, she had an exacerbation with right arm numbness/pain and mild weakness lasting about 2 weeks.  Gait is mildly reduced.  Balance is mildly reduced.  She does not always keep up with others..  She could easily do a mile but not at the same pace as she did a couple years ago.   She has been using the bannister on stairs.   Strength is doing well.  She denies numbness now..     Right facial myokymia resolved (ha at onset).      Vision is baseline and colors are symmetric.   Vision used to be a little better.   Her bladder is doing well.    Fatigue is a problem some days...   Heat makes this wore     She has mild cognitive issues - forgetful, reduced focus/attention and some word finding issues..  Adderall had helped some but increased her blood pressure.  Provigil was well-tolerated and helps to mitigate with this  as well as fatigue she has OSA, (mild overall but severe during REM).  She felt much better on CPAP but insurance did not cover it well and she opted not to stay  on CPAP.    No depression or anxiety  EPWORTH SLEEPINESS SCALE  On a scale of 0 - 3 what is the chance of dozing:  Sitting and Reading:   2 Watching TV:    3 Sitting inactive in a public place: 1 Passenger in car for one hour: 3 Lying down to rest in the afternoon: 2 Sitting and talking to someone: 0 Sitting quietly after lunch:  1 In a car, stopped in traffic:  0  Total (out of 24):   12/24   Mild EDS   She has been placed on Zepbound and has lost 40 pounds.   She has HTN and hyperlipidemia and OSA.     MS HISTORY: She had an episode of bilateral leg weakness in 2012, she improved over 4-5 months.  She felt symptoms were due to working out.  In 2016, she had the onset of poor balance x 3-6 months that completely resolved.    She never sought out medical/neurologic care.  In August 2019, she had some weakness and numbness in her legs for a couple weeks but then improved after she saw a Land.    In December 2019,  she had the onset of tingling in both hands and right hand weakness.   She went to the ED.  She had MRI of the brain and MRI of the cervical spine and they were consistent with MS.  She was admitted and received 5 days of IV Solu-Medrol.   She felt better by her discharge, closer to her baseline.   She went on Tysabri initially, but due to cost switched to a siponimod drug study in 2022.   She has been on siponimod/Mayzent since January 2021.   She had a relapse with acute MRI changes 03/2021.  She was noncompliant at the time.  She had another exacerbation in early 2024 despite better compliance.  IMAGING: MRI of the brain and cervical spine dated 01/30/2018.  The brain shows multiple T2/flair hyperintense foci in the hemispheres in the periventricular, juxtacortical and deep white matter.  There are 2 foci  peripherally to the right in the pons and another focus in the left middle cerebellar peduncle.  The one pontine focus enhances.  2 small foci in the hemispheres enhance.  The MRI of the cervical spine shows several foci in the spinal cord including 1 at C3 to the left and 1 at C6-C7 on the right that enhance.    The other foci located at C2-C3 posteriorly and C3 posteriorly to the right.  MRI of the brain 05/23/2019 showed multiple T2/FLAIR hyperintense foci in the brainstem, cerebellum and cerebral hemispheres in a pattern and configuration consistent with chronic demyelinating plaque associated with multiple sclerosis. None of the foci appear to be acute. Compared to the MRI from 01/30/2018, there are no new lesions. Several foci that were enhancing in 2019 no longer do so.   MRI 03/24/2021 showed "  Multiple T2/FLAIR hyperintense foci in the brainstem, cervical cerebellum, spinal cord and cerebral hemispheres in a pattern consistent with demyelinating plaque associated with multiple sclerosis.  1 focus in the left parietal lobe enhances consistent with an acute demyelinating plaque.  Additionally, there are 4 chronic plaques in the hemispheres seen on the current MRI that were not present on the MRI from 05/23/2019 consistent with plaques developing during the interim.".  I called her to discuss changing DMT and she reported that compliance was poor .   She has since been very comliant with the Mayzent.     MRI cervical spine 03/08/2021 showed  Four T2 hyperintense foci within the spinal cord adjacent to C2-C3, C3-C4, C4 and C6-C7.  The foci at C2-C3, C4 C6-C7 were clearly present on the previous MRI and the C2-C3 and C6-C7 foci were larger and enhanced at that time.  The focus adjacent to C3-C4 posterolaterally to the right was not clearly present on the previous MRI and could represent interval development of an additional plaque.  1 focus adjacent to C4-C5 to the right on the 01/30/2018 MRI was not clearly  present on the current MRI  MRI of the brain 09/2021 showed no new lesions.  Other studies: Polysomnography 04/23/2018 showed mild overall AHI (10.6) that was severe during REM sleep (REM AHI equals 37.9).  There is no significant restless leg syndrome.   She tolerated CPAP (APAP) but had high copays.     REVIEW OF SYSTEMS: Constitutional: No fevers, chills, sweats, or change in appetite Eyes: No visual changes, double vision, eye pain Ear, nose and throat: No hearing loss, ear pain, nasal congestion, sore throat Cardiovascular: No chest pain, palpitations Respiratory:  No shortness of breath at rest or with  exertion.   No wheezes GastrointestinaI: No nausea, vomiting, diarrhea, abdominal pain, fecal incontinence Genitourinary:  No dysuria, urinary retention or frequency.  No nocturia. Musculoskeletal:  No neck pain, back pain Integumentary: No rash, pruritus, skin lesions Neurological: as above Psychiatric: No depression at this time.  No anxiety Endocrine: No palpitations, diaphoresis, change in appetite, change in weigh or increased thirst Hematologic/Lymphatic:  No anemia, purpura, petechiae. Allergic/Immunologic: No itchy/runny eyes, nasal congestion, recent allergic reactions, rashes  ALLERGIES: No Known Allergies  HOME MEDICATIONS:  Current Outpatient Medications:    amLODipine (NORVASC) 5 MG tablet, Take 1 tablet by mouth once daily, Disp: 90 tablet, Rfl: 1   Siponimod Fumarate (MAYZENT) 2 MG TABS, Take 1 tablet (2 mg total) by mouth daily., Disp: 90 tablet, Rfl: 3   tirzepatide (ZEPBOUND) 7.5 MG/0.5ML Pen, Inject 7.5 mg into the skin once a week., Disp: 2 mL, Rfl: 0   modafinil (PROVIGIL) 200 MG tablet, Take 1 tablet (200 mg total) by mouth daily. (Patient not taking: Reported on 10/02/2022), Disp: 30 tablet, Rfl: 5  PAST MEDICAL HISTORY: Past Medical History:  Diagnosis Date   Anemia    Heart palpitations    Hypertension    MS (multiple sclerosis) (H[facility])    Pregnancy  induced hypertension     PAST SURGICAL HISTORY: Past Surgical History:  Procedure Laterality Date   NO PAST SURGERIES      FAMILY HISTORY: Family History  Problem Relation Age of Onset   Hypertension Other    Hypertension Maternal Grandmother    Hypertension Paternal Grandmother    Diabetes Father    High blood pressure Father     SOCIAL HISTORY:  Social History   Socioeconomic History   Marital status: Single    Spouse name: Not on file   Number of children: 2   Years of education: 14   Highest education level: Associate degree: occupational, Scientist, product/process development, or vocational program  Occupational History   Occupation: Orthoptist  Tobacco Use   Smoking status: Never   Smokeless tobacco: Never  Substance and Sexual Activity   Alcohol use: No   Drug use: No   Sexual activity: Yes    Birth control/protection: None  Other Topics Concern   Not on file  Social History Narrative   Lives with family   Caffeine use: soda sometimes   Right handed    Social Determinants of Health   Financial Resource Strain: Medium Risk (01/25/2021)   Overall Financial Resource Strain (CARDIA)    Difficulty of Paying Living Expenses: Somewhat hard  Food Insecurity: No Food Insecurity (01/25/2021)   Hunger Vital Sign    Worried About Running Out of Food in the Last Year: Never true    Ran Out of Food in the Last Year: Never true  Transportation Needs: Not on file  Physical Activity: Insufficiently Active (01/25/2021)   Exercise Vital Sign    Days of Exercise per Week: 3 days    Minutes of Exercise per Session: 30 min  Stress: Stress Concern Present (01/25/2021)   [employer] of Occupational Health - Occupational Stress Questionnaire    Feeling of Stress : Very much  Social Connections: Moderately Isolated (01/25/2021)   Social Connection and Isolation Panel [NHANES]    Frequency of Communication with Friends and Family: More than three times a week    Frequency of  Social Gatherings with Friends and Family: Once a week    Attends Religious Services: Never    Database administrator or Organizations:  No    Attends Banker Meetings: Not on file    Marital Status: Living with partner  Intimate Partner Violence: Not on file     PHYSICAL EXAM  Vitals:   10/02/22 1459  BP: 107/73  Pulse: 93  Weight: 260 lb (117.9 kg)  Height: 5\' 7"  (1.702 m)     Body mass index is 40.72 kg/m.  No results found.   General: The patient is well-developed and well-nourished and in no acute distress.  Abdomen is nontender with normal sounds.  HEENT: Tympanic membranes were intact on the right.  There is a fair amount of wax in the left external ear canal and I could not clearly see the tympanic membrane.  Funduscopic exam shows normal optic discs and retinal vessels.   Skin: Extremities are without rash or edema.   Neurologic Exam  Mental status: The patient is alert and oriented x 3 at the time of the examination. The patient has apparent normal recent and remote memory, with an apparently normal attention span and concentration ability.   Speech is normal.  Cranial nerves: Extraocular movements are full.  She has a mild right APD.  Color vision was symmetric, however.  Facial strength and sensation was normal.  There was no myokymia.  Although hearing was symmetric to the tuning fork, the Weber lateralized towards the left.  Motor:  Muscle bulk is normal.   Tone is normal. Strength is  5 / 5 in all 4 extremities.   Sensory: She has intact sensation to touch and vibration in the arms and legs.  Coordination: Cerebellar testing reveals good finger-nose-finger and heel-to-shin bilaterally.  Gait and station: Station is normal.  Gait is normal.  Tandem gait is mildly wide.  The Romberg is negative. Reflexes: Deep tendon reflexes are symmetric and normal in arms and increased in legs without clonus.       DIAGNOSTIC DATA (LABS, IMAGING, TESTING) -  I reviewed patient records, labs, notes, testing and imaging myself where available.  Lab Results  Component Value Date   WBC 3.8 04/03/2022   HGB 12.2 04/03/2022   HCT 36.4 04/03/2022   MCV 91 04/03/2022   PLT 259 04/03/2022      Component Value Date/Time   NA 140 09/09/2021 0216   K 3.7 09/09/2021 0216   CL 106 09/09/2021 0216   CO2 24 09/09/2021 0216   GLUCOSE 93 09/09/2021 0216   BUN 10 09/09/2021 0216   CREATININE 1.02 (H) 09/09/2021 0216   CREATININE 1.06 (H) 02/16/2021 0856   CALCIUM 9.0 09/09/2021 0216   PROT 6.7 04/03/2022 1413   ALBUMIN 4.1 04/03/2022 1413   AST 15 04/03/2022 1413   ALT 20 04/03/2022 1413   ALKPHOS 58 04/03/2022 1413   BILITOT 0.4 04/03/2022 1413   GFRNONAA >60 09/09/2021 0216   GFRAA >60 12/22/2018 0240     ____________________________________________________________   1. Multiple sclerosis (HCC)   2. High risk medication use   3. OSA (obstructive sleep apnea)   4. Gait disturbance      1.  She has had an exacerbation while on Mayzent.   She did better on Tysabri with no exacerbation.  She would like to go back to Tysabri and I concur.  She signed a service request form and we will check JCV antibody and other tests 2.    Stay active and exercise as tolerated. 3.   She has lost weight since starting Zepbound.  Hopefully this will help with the OSA  4.   Supplement Vit D (was 17 in past)  6.   rtc 6 months or sooner if new or worsening neurologic issues.  Sydni Elizarraraz A. Epimenio Foot, MD, [facility] 10/02/2022, 3:29 PM Certified in Neurology, Clinical Neurophysiology, Sleep Medicine, Pain Medicine and Neuroimaging  [facility] [address], [address] [place], [place] [address] [phone]

## 2022-10-02 NOTE — Telephone Encounter (Signed)
Placed JCV in [facility] 10/02/2022

## 2022-10-03 LAB — CBC WITH DIFFERENTIAL/PLATELET
Basophils Absolute: 0 10*3/uL (ref 0.0–0.2)
Basos: 1 %
EOS (ABSOLUTE): 0.1 10*3/uL (ref 0.0–0.4)
Eos: 3 %
Hematocrit: 33.9 % — ABNORMAL LOW (ref 34.0–46.6)
Hemoglobin: 11.6 g/dL (ref 11.1–15.9)
Immature Grans (Abs): 0 10*3/uL (ref 0.0–0.1)
Immature Granulocytes: 1 %
Lymphocytes Absolute: 0.4 10*3/uL — ABNORMAL LOW (ref 0.7–3.1)
Lymphs: 17 %
MCH: 30.6 pg (ref 26.6–33.0)
MCHC: 34.2 g/dL (ref 31.5–35.7)
MCV: 89 fL (ref 79–97)
Monocytes Absolute: 0.4 10*3/uL (ref 0.1–0.9)
Monocytes: 19 %
Neutrophils Absolute: 1.3 10*3/uL — ABNORMAL LOW (ref 1.4–7.0)
Neutrophils: 59 %
Platelets: 267 10*3/uL (ref 150–450)
RBC: 3.79 x10E6/uL (ref 3.77–5.28)
RDW: 13.1 % (ref 11.7–15.4)
WBC: 2.2 10*3/uL — CL (ref 3.4–10.8)

## 2022-10-03 LAB — COMPREHENSIVE METABOLIC PANEL
ALT: 26 IU/L (ref 0–32)
AST: 18 IU/L (ref 0–40)
Albumin: 3.9 g/dL (ref 3.9–4.9)
Alkaline Phosphatase: 60 IU/L (ref 44–121)
BUN/Creatinine Ratio: 11 (ref 9–23)
BUN: 10 mg/dL (ref 6–20)
Bilirubin Total: 0.3 mg/dL (ref 0.0–1.2)
CO2: 26 mmol/L (ref 20–29)
Calcium: 9 mg/dL (ref 8.7–10.2)
Chloride: 106 mmol/L (ref 96–106)
Creatinine, Ser: 0.95 mg/dL (ref 0.57–1.00)
Globulin, Total: 2.3 g/dL (ref 1.5–4.5)
Glucose: 81 mg/dL (ref 70–99)
Potassium: 3.9 mmol/L (ref 3.5–5.2)
Sodium: 143 mmol/L (ref 134–144)
Total Protein: 6.2 g/dL (ref 6.0–8.5)
eGFR: 79 mL/min/{1.73_m2} (ref >59)

## 2022-10-09 ENCOUNTER — Encounter: Payer: Self-pay | Admitting: Pharmacist

## 2022-10-11 MED ORDER — ZEPBOUND 7.5 MG/0.5ML ~~LOC~~ SOAJ: 7.5000 mg | SUBCUTANEOUS | 2 refills | Status: DC

## 2022-10-11 NOTE — Telephone Encounter (Signed)
Received from [facility], JCV antibody Negative Index 0.07.

## 2022-10-15 ENCOUNTER — Telehealth: Payer: Self-pay | Admitting: *Deleted

## 2022-10-15 NOTE — Telephone Encounter (Signed)
      FAX confirmation received [facility] 8 pgs. [phone]

## 2022-10-15 NOTE — Telephone Encounter (Signed)
Tysabri / Enrollment form completed and faxed to [facility].

## 2022-10-16 ENCOUNTER — Other Ambulatory Visit: Payer: Self-pay | Admitting: Internal Medicine

## 2022-10-16 DIAGNOSIS — I10 Essential (primary) hypertension: Secondary | ICD-10-CM

## 2022-10-16 NOTE — Telephone Encounter (Signed)
Tysabri orders and MS touch enrollment given to [facility] in [place].

## 2022-11-05 ENCOUNTER — Encounter: Payer: Self-pay | Admitting: *Deleted

## 2022-11-09 MED ORDER — ZEPBOUND 10 MG/0.5ML ~~LOC~~ SOAJ: 10.0000 mg | SUBCUTANEOUS | 3 refills | Status: DC

## 2022-11-09 NOTE — Addendum Note (Signed)
Addended by: Malena Peer D on: 11/09/2022 10:42 AM   Modules accepted: Orders

## 2022-12-06 NOTE — Telephone Encounter (Signed)
LVM for pt to call office

## 2022-12-10 ENCOUNTER — Other Ambulatory Visit: Payer: Self-pay | Admitting: Neurology

## 2022-12-12 NOTE — Telephone Encounter (Signed)
Informed intrafusion about change. Also sent [facility] update that pt has decided not to proceed with Tysabri.

## 2022-12-12 NOTE — Telephone Encounter (Signed)
Last seen on 10/02/22 Follow up scheduled on 04/24/23 Last filled on 04/03/22 #30 tablets  Rx pending to be signed

## 2022-12-19 ENCOUNTER — Other Ambulatory Visit: Payer: Self-pay | Admitting: Internal Medicine

## 2022-12-19 DIAGNOSIS — I10 Essential (primary) hypertension: Secondary | ICD-10-CM

## 2023-01-06 ENCOUNTER — Encounter: Payer: Self-pay | Admitting: Pharmacist

## 2023-01-07 MED ORDER — ZEPBOUND 12.5 MG/0.5ML ~~LOC~~ SOAJ: 12.5000 mg | SUBCUTANEOUS | 3 refills | Status: DC

## 2023-01-09 ENCOUNTER — Encounter: Payer: Self-pay | Admitting: Internal Medicine

## 2023-01-09 ENCOUNTER — Ambulatory Visit: Payer: BC Managed Care – PPO | Admitting: Internal Medicine

## 2023-01-09 VITALS — BP 120/80 | HR 80 | Temp 97.9°F | Ht 66.5 in | Wt 238.4 lb

## 2023-01-09 DIAGNOSIS — E782 Mixed hyperlipidemia: Secondary | ICD-10-CM | POA: Diagnosis not present

## 2023-01-09 DIAGNOSIS — E559 Vitamin D deficiency, unspecified: Secondary | ICD-10-CM | POA: Diagnosis not present

## 2023-01-09 DIAGNOSIS — Z23 Encounter for immunization: Secondary | ICD-10-CM | POA: Diagnosis not present

## 2023-01-09 DIAGNOSIS — Z0000 Encounter for general adult medical examination without abnormal findings: Secondary | ICD-10-CM

## 2023-01-09 DIAGNOSIS — I10 Essential (primary) hypertension: Secondary | ICD-10-CM | POA: Diagnosis not present

## 2023-01-09 DIAGNOSIS — G4733 Obstructive sleep apnea (adult) (pediatric): Secondary | ICD-10-CM

## 2023-01-09 NOTE — Addendum Note (Signed)
Addended by: Kern Reap B on: 01/09/2023 04:11 PM   Modules accepted: Orders

## 2023-01-09 NOTE — Progress Notes (Signed)
Established Patient Office Visit     CC/Reason for Visit: Annual preventive exam  HPI: Amanda Wise is a 37 y.o. female who is coming in today for the above mentioned reasons. Past Medical History is significant for: Hypertension, morbid obesity, multiple sclerosis.  She has been on Zepbound for 10 months and has lost an impressive 120 pounds.  She stopped taking her blood pressure medication a couple weeks ago as she was having dizziness after taking.  She is due for flu vaccine.  Has routine eye and dental care.   Past Medical/Surgical History: Past Medical History:  Diagnosis Date   Anemia    Heart palpitations    Hypertension    MS (multiple sclerosis) (HCC)    Pregnancy induced hypertension     Past Surgical History:  Procedure Laterality Date   NO PAST SURGERIES      Social History:  reports that she has never smoked. She has never used smokeless tobacco. She reports that she does not drink alcohol and does not use drugs.  Allergies: No Known Allergies  Family History:  Family History  Problem Relation Age of Onset   Hypertension Other    Hypertension Maternal Grandmother    Hypertension Paternal Grandmother    Diabetes Father    High blood pressure Father      Current Outpatient Medications:    amLODipine (NORVASC) 5 MG tablet, TAKE 1 TABLET BY MOUTH ONCE DAILY . APPOINTMENT REQUIRED FOR FUTURE REFILLS, Disp: 30 tablet, Rfl: 0   modafinil (PROVIGIL) 200 MG tablet, Take 1 tablet by mouth once daily, Disp: 30 tablet, Rfl: 0   Siponimod Fumarate (MAYZENT) 2 MG TABS, Take 1 tablet (2 mg total) by mouth daily., Disp: 90 tablet, Rfl: 3   tirzepatide (ZEPBOUND) 12.5 MG/0.5ML Pen, Inject 12.5 mg into the skin once a week., Disp: 2 mL, Rfl: 3   Vitamin D, Ergocalciferol, (DRISDOL) 1.25 MG (50000 UNIT) CAPS capsule, Take 1 capsule (50,000 Units total) by mouth every 7 (seven) days., Disp: 13 capsule, Rfl: 1  Review of Systems:  Negative unless indicated  in HPI.   Physical Exam: Vitals:   01/09/23 1355  BP: 120/80  Pulse: 80  Temp: 97.9 F (36.6 C)  TempSrc: Oral  SpO2: 100%  Weight: 238 lb 6.4 oz (108.1 kg)  Height: 5' 6.5" (1.689 m)    Body mass index is 37.9 kg/m.   Physical Exam Vitals reviewed.  Constitutional:      General: She is not in acute distress.    Appearance: Normal appearance. She is not ill-appearing, toxic-appearing or diaphoretic.  HENT:     Head: Normocephalic.     Right Ear: Tympanic membrane, ear canal and external ear normal. There is no impacted cerumen.     Left Ear: Tympanic membrane, ear canal and external ear normal. There is no impacted cerumen.     Nose: Nose normal.     Mouth/Throat:     Mouth: Mucous membranes are moist.     Pharynx: Oropharynx is clear. No oropharyngeal exudate or posterior oropharyngeal erythema.  Eyes:     General: No scleral icterus.       Right eye: No discharge.        Left eye: No discharge.     Conjunctiva/sclera: Conjunctivae normal.     Pupils: Pupils are equal, round, and reactive to light.  Neck:     Vascular: No carotid bruit.  Cardiovascular:     Rate and Rhythm: Normal rate  and regular rhythm.     Pulses: Normal pulses.     Heart sounds: Normal heart sounds.  Pulmonary:     Effort: Pulmonary effort is normal. No respiratory distress.     Breath sounds: Normal breath sounds.  Abdominal:     General: Abdomen is flat. Bowel sounds are normal.     Palpations: Abdomen is soft.  Musculoskeletal:        General: Normal range of motion.     Cervical back: Normal range of motion.  Skin:    General: Skin is warm and dry.  Neurological:     General: No focal deficit present.     Mental Status: She is alert and oriented to person, place, and time. Mental status is at baseline.  Psychiatric:        Mood and Affect: Mood normal.        Behavior: Behavior normal.        Thought Content: Thought content normal.        Judgment: Judgment normal.      Flowsheet Row Office Visit from 01/09/2023 in [facility] [place]  PHQ-9 Total Score 7        Impression and Plan:  Primary hypertension -     CBC with Differential/Platelet; Future -     Comprehensive metabolic panel; Future  Vitamin D deficiency -     VITAMIN D 25 Hydroxy (Vit-D Deficiency, Fractures); Future  Mixed hyperlipidemia -     Lipid panel; Future  Encounter for preventive health examination  Essential hypertension  Morbid obesity (HCC) -     TSH; Future -     Vitamin B12; Future  OSA (obstructive sleep apnea)  Immunization due  -Recommend routine eye and dental care. -Healthy lifestyle discussed in detail. -Labs to be updated today. -Prostate cancer screening: N/A Health Maintenance  Topic Date Due   COVID-19 Vaccine (3 - Moderna risk series) 06/04/2019   Flu Shot  09/06/2022   Pap with HPV screening  04/28/2024   DTaP/Tdap/Td vaccine (2 - Td or Tdap) 12/27/2024   Hepatitis C Screening  Completed   HIV Screening  Completed   HPV Vaccine  Aged Out    -Flu vaccine administered in office today. -Advised to update COVID at pharmacy. -Cancer screening is up-to-date. -Congratulated on weight loss success. -Okay to discontinue blood pressure medication.    Chaya Jan, MD [facility]

## 2023-01-10 LAB — COMPREHENSIVE METABOLIC PANEL
ALT: 34 U/L (ref 0–35)
AST: 20 U/L (ref 0–37)
Albumin: 3.6 g/dL (ref 3.5–5.2)
Alkaline Phosphatase: 61 U/L (ref 39–117)
BUN: 14 mg/dL (ref 6–23)
CO2: 29 meq/L (ref 19–32)
Calcium: 8.9 mg/dL (ref 8.4–10.5)
Chloride: 104 meq/L (ref 96–112)
Creatinine, Ser: 0.95 mg/dL (ref 0.40–1.20)
GFR: 75.78 mL/min (ref >60.00)
Glucose, Bld: 83 mg/dL (ref 70–99)
Potassium: 3.8 meq/L (ref 3.5–5.1)
Sodium: 139 meq/L (ref 135–145)
Total Bilirubin: 0.4 mg/dL (ref 0.2–1.2)
Total Protein: 6.2 g/dL (ref 6.0–8.3)

## 2023-01-10 LAB — CBC WITH DIFFERENTIAL/PLATELET
Basophils Absolute: 0 10*3/uL (ref 0.0–0.1)
Basophils Relative: 0.9 % (ref 0.0–3.0)
Eosinophils Absolute: 0.2 10*3/uL (ref 0.0–0.7)
Eosinophils Relative: 5.1 % — ABNORMAL HIGH (ref 0.0–5.0)
HCT: 33.1 % — ABNORMAL LOW (ref 36.0–46.0)
Hemoglobin: 11.6 g/dL — ABNORMAL LOW (ref 12.0–15.0)
Lymphocytes Relative: 9.4 % — ABNORMAL LOW (ref 12.0–46.0)
Lymphs Abs: 0.3 10*3/uL — ABNORMAL LOW (ref 0.7–4.0)
MCHC: 35 g/dL (ref 30.0–36.0)
MCV: 91.4 fL (ref 78.0–100.0)
Monocytes Absolute: 0.6 10*3/uL (ref 0.1–1.0)
Monocytes Relative: 17.5 % — ABNORMAL HIGH (ref 3.0–12.0)
Neutro Abs: 2.4 10*3/uL (ref 1.4–7.7)
Neutrophils Relative %: 67.1 % (ref 43.0–77.0)
Platelets: 234 10*3/uL (ref 150.0–400.0)
RBC: 3.62 Mil/uL — ABNORMAL LOW (ref 3.87–5.11)
RDW: 13.6 % (ref 11.5–15.5)
WBC: 3.6 10*3/uL — ABNORMAL LOW (ref 4.0–10.5)

## 2023-01-10 LAB — TSH: TSH: 0.35 u[IU]/mL (ref 0.35–5.50)

## 2023-01-10 LAB — LIPID PANEL
Cholesterol: 226 mg/dL — ABNORMAL HIGH (ref 0–200)
HDL: 51.4 mg/dL (ref >39.00)
LDL Cholesterol: 153 mg/dL — ABNORMAL HIGH (ref 0–99)
NonHDL: 174.43
Total CHOL/HDL Ratio: 4
Triglycerides: 107 mg/dL (ref 0.0–149.0)
VLDL: 21.4 mg/dL (ref 0.0–40.0)

## 2023-01-10 LAB — VITAMIN B12: Vitamin B-12: 251 pg/mL (ref 211–911)

## 2023-01-10 LAB — VITAMIN D 25 HYDROXY (VIT D DEFICIENCY, FRACTURES): VITD: 15.64 ng/mL — ABNORMAL LOW (ref 30.00–100.00)

## 2023-01-14 ENCOUNTER — Other Ambulatory Visit: Payer: Self-pay | Admitting: Internal Medicine

## 2023-01-14 DIAGNOSIS — E559 Vitamin D deficiency, unspecified: Secondary | ICD-10-CM

## 2023-01-14 DIAGNOSIS — E782 Mixed hyperlipidemia: Secondary | ICD-10-CM

## 2023-01-14 MED ORDER — ATORVASTATIN CALCIUM 20 MG PO TABS: 20.0000 mg | ORAL_TABLET | Freq: Every day | ORAL | 1 refills | Status: DC

## 2023-01-14 MED ORDER — VITAMIN D (ERGOCALCIFEROL) 1.25 MG (50000 UNIT) PO CAPS: 50000.0000 [IU] | ORAL_CAPSULE | ORAL | 0 refills | Status: AC

## 2023-01-15 ENCOUNTER — Other Ambulatory Visit: Payer: Self-pay | Admitting: *Deleted

## 2023-01-15 ENCOUNTER — Encounter: Payer: Self-pay | Admitting: *Deleted

## 2023-01-15 DIAGNOSIS — E559 Vitamin D deficiency, unspecified: Secondary | ICD-10-CM

## 2023-01-15 DIAGNOSIS — D649 Anemia, unspecified: Secondary | ICD-10-CM

## 2023-01-15 DIAGNOSIS — E782 Mixed hyperlipidemia: Secondary | ICD-10-CM

## 2023-02-25 ENCOUNTER — Encounter: Payer: Self-pay | Admitting: Pharmacist

## 2023-02-25 MED ORDER — ZEPBOUND 15 MG/0.5ML ~~LOC~~ SOAJ: 15.0000 mg | SUBCUTANEOUS | 11 refills | Status: DC

## 2023-03-14 ENCOUNTER — Other Ambulatory Visit (HOSPITAL_COMMUNITY): Payer: Self-pay

## 2023-03-14 ENCOUNTER — Telehealth: Payer: Self-pay

## 2023-03-14 NOTE — Telephone Encounter (Signed)
 Pharmacy Patient Advocate Encounter   Received notification from CoverMyMeds that prior authorization for ZEPBOUND  is required/requested.   Insurance verification completed.   The patient is insured through [employer] .   Per test claim: Refill too soon. PA is not needed at this time. Medication was filled 02/25/23. Next eligible fill date is 03/18/23.

## 2023-03-18 ENCOUNTER — Telehealth: Payer: Self-pay

## 2023-03-18 DIAGNOSIS — Z0289 Encounter for other administrative examinations: Secondary | ICD-10-CM

## 2023-03-18 NOTE — Telephone Encounter (Signed)
 FMLA forms completed, placed in pod for MD to review and sign.

## 2023-03-19 NOTE — Telephone Encounter (Signed)
Forms signed and placed in MR for pick up.

## 2023-03-20 NOTE — Telephone Encounter (Signed)
Completed form faxed per MR

## 2023-04-12 ENCOUNTER — Emergency Department (HOSPITAL_BASED_OUTPATIENT_CLINIC_OR_DEPARTMENT_OTHER)
Admission: EM | Admit: 2023-04-12 | Discharge: 2023-04-12 | Disposition: A | Discharge status: Home / Self Care | Attending: Emergency Medicine | Admitting: Emergency Medicine

## 2023-04-12 ENCOUNTER — Other Ambulatory Visit: Payer: Self-pay

## 2023-04-12 DIAGNOSIS — L299 Pruritus, unspecified: Secondary | ICD-10-CM | POA: Insufficient documentation

## 2023-04-12 DIAGNOSIS — I10 Essential (primary) hypertension: Secondary | ICD-10-CM | POA: Insufficient documentation

## 2023-04-12 DIAGNOSIS — R112 Nausea with vomiting, unspecified: Secondary | ICD-10-CM | POA: Insufficient documentation

## 2023-04-12 LAB — CBC
HCT: 36 % (ref 36.0–46.0)
Hemoglobin: 12.6 g/dL (ref 12.0–15.0)
MCH: 30.7 pg (ref 26.0–34.0)
MCHC: 35 g/dL (ref 30.0–36.0)
MCV: 87.6 fL (ref 80.0–100.0)
Platelets: 258 10*3/uL (ref 150–400)
RBC: 4.11 MIL/uL (ref 3.87–5.11)
RDW: 13.2 % (ref 11.5–15.5)
WBC: 4.4 10*3/uL (ref 4.0–10.5)
nRBC: 0 % (ref 0.0–0.2)

## 2023-04-12 LAB — COMPREHENSIVE METABOLIC PANEL
ALT: 44 U/L (ref 0–44)
AST: 20 U/L (ref 15–41)
Albumin: 4.1 g/dL (ref 3.5–5.0)
Alkaline Phosphatase: 50 U/L (ref 38–126)
Anion gap: 8 (ref 5–15)
BUN: 13 mg/dL (ref 6–20)
CO2: 23 mmol/L (ref 22–32)
Calcium: 9.3 mg/dL (ref 8.9–10.3)
Chloride: 106 mmol/L (ref 98–111)
Creatinine, Ser: 0.75 mg/dL (ref 0.44–1.00)
GFR, Estimated: >60 mL/min (ref >60)
Glucose, Bld: 91 mg/dL (ref 70–99)
Potassium: 3.5 mmol/L (ref 3.5–5.1)
Sodium: 137 mmol/L (ref 135–145)
Total Bilirubin: 0.7 mg/dL (ref 0.0–1.2)
Total Protein: 6.6 g/dL (ref 6.5–8.1)

## 2023-04-12 LAB — LIPASE, BLOOD: Lipase: 48 U/L (ref 11–51)

## 2023-04-12 MED ORDER — DIPHENHYDRAMINE HCL 25 MG PO CAPS
25.0000 mg | ORAL_CAPSULE | Freq: Once | ORAL | Status: AC
  Administered 2023-04-12: 25 mg via ORAL
  Filled 2023-04-12: qty 1

## 2023-04-12 MED ORDER — FAMOTIDINE 20 MG PO TABS
20.0000 mg | ORAL_TABLET | Freq: Once | ORAL | Status: AC
  Administered 2023-04-12: 20 mg via ORAL
  Filled 2023-04-12: qty 1

## 2023-04-12 MED ORDER — DIPHENHYDRAMINE HCL 25 MG PO TABS: 25.0000 mg | ORAL_TABLET | Freq: Four times a day (QID) | ORAL | 0 refills | Status: DC

## 2023-04-12 MED ORDER — DEXAMETHASONE 4 MG PO TABS
10.0000 mg | ORAL_TABLET | Freq: Once | ORAL | Status: AC
  Administered 2023-04-12: 10 mg via ORAL
  Filled 2023-04-12: qty 3

## 2023-04-12 MED ORDER — ONDANSETRON 4 MG PO TBDP
4.0000 mg | ORAL_TABLET | Freq: Once | ORAL | Status: AC | PRN
  Administered 2023-04-12: 4 mg via ORAL
  Filled 2023-04-12: qty 1

## 2023-04-12 MED ORDER — FAMOTIDINE 20 MG PO TABS: 20.0000 mg | ORAL_TABLET | Freq: Two times a day (BID) | ORAL | 0 refills | Status: DC

## 2023-04-12 NOTE — ED Triage Notes (Signed)
 Pt POV from home reporting nvd that began this afternoon followed by itching all over body and facial swelling, took benadryl, swelling improved but itching persists.

## 2023-04-12 NOTE — Discharge Instructions (Signed)
 You were evaluated in the Emergency Department and after careful evaluation, we did not find any emergent condition requiring admission or further testing in the hospital.  Your exam/testing today is overall reassuring.  Symptoms may have been due to an allergic reaction.  Recommend stopping her collagen cream.  Use the Benadryl every 4-6 hours as needed for itching.  Use the Pepcid every 12 hours as needed for itching.  Please return to the Emergency Department if you experience any worsening of your condition.   Thank you for allowing [place] to be a part of your care.

## 2023-04-12 NOTE — ED Provider Notes (Signed)
 [facility] Emergency Department Provider Note MRN:  [id number]  Arrival date & time: 04/12/23     Chief Complaint   Emesis and Allergic Reaction   History of Present Illness   Amanda Wise is a 38 y.o. year-old female with a history of hypertension presenting to the ED with chief complaint of emesis.  Had some nausea vomiting earlier this evening followed by total body itchiness.  Thinks maybe she is allergic to something.  Denies shortness of breath, no diarrhea, no pain.  Benadryl helped a little bit.  Review of Systems  A thorough review of systems was obtained and all systems are negative except as noted in the HPI and PMH.   Patient's Health History    Past Medical History:  Diagnosis Date   Anemia    Heart palpitations    Hypertension    MS (multiple sclerosis) (HCC)    Pregnancy induced hypertension     Past Surgical History:  Procedure Laterality Date   NO PAST SURGERIES      Family History  Problem Relation Age of Onset   Hypertension Other    Hypertension Maternal Grandmother    Hypertension Paternal Grandmother    Diabetes Father    High blood pressure Father     Social History   Socioeconomic History   Marital status: Single    Spouse name: Not on file   Number of children: 2   Years of education: 14   Highest education level: Associate degree: occupational, Scientist, product/process development, or vocational program  Occupational History   Occupation: Orthoptist  Tobacco Use   Smoking status: Never   Smokeless tobacco: Never  Substance and Sexual Activity   Alcohol use: No   Drug use: No   Sexual activity: Yes    Birth control/protection: None  Other Topics Concern   Not on file  Social History Narrative   Lives with family   Caffeine use: soda sometimes   Right handed    Social Drivers of Health   Financial Resource Strain: Medium Risk (01/25/2021)   Overall Financial Resource Strain (CARDIA)    Difficulty of  Paying Living Expenses: Somewhat hard  Food Insecurity: No Food Insecurity (01/25/2021)   Hunger Vital Sign    Worried About Running Out of Food in the Last Year: Never true    Ran Out of Food in the Last Year: Never true  Transportation Needs: Not on file  Physical Activity: Insufficiently Active (01/25/2021)   Exercise Vital Sign    Days of Exercise per Week: 3 days    Minutes of Exercise per Session: 30 min  Stress: Stress Concern Present (01/25/2021)   [employer] of Occupational Health - Occupational Stress Questionnaire    Feeling of Stress : Very much  Social Connections: Moderately Isolated (01/25/2021)   Social Connection and Isolation Panel [NHANES]    Frequency of Communication with Friends and Family: More than three times a week    Frequency of Social Gatherings with Friends and Family: Once a week    Attends Religious Services: Never    Database administrator or Organizations: No    Attends Engineer, structural: Not on file    Marital Status: Living with partner  Intimate Partner Violence: Not on file     Physical Exam   Vitals:   04/12/23 0125  BP: 120/81  Pulse: 79  Resp: 18  Temp: 98.6 F (37 C)  SpO2: 100%    CONSTITUTIONAL: Well-appearing, NAD NEURO/PSYCH:  Alert and oriented x 3, no focal deficits EYES:  eyes equal and reactive ENT/NECK:  no LAD, no JVD CARDIO: Regular rate, well-perfused, normal S1 and S2 PULM:  CTAB no wheezing or rhonchi GI/GU:  non-distended, non-tender MSK/SPINE:  No gross deformities, no edema SKIN:  no rash, atraumatic   *Additional and/or pertinent findings included in MDM below  Diagnostic and Interventional Summary    EKG Interpretation Date/Time:    Ventricular Rate:    PR Interval:    QRS Duration:    QT Interval:    QTC Calculation:   R Axis:      Text Interpretation:         Labs Reviewed  LIPASE, BLOOD  COMPREHENSIVE METABOLIC PANEL  CBC  URINALYSIS, ROUTINE W REFLEX MICROSCOPIC   PREGNANCY, URINE    No orders to display    Medications  dexamethasone (DECADRON) tablet 10 mg (has no administration in time range)  famotidine (PEPCID) tablet 20 mg (has no administration in time range)  diphenhydrAMINE (BENADRYL) capsule 25 mg (has no administration in time range)  ondansetron (ZOFRAN-ODT) disintegrating tablet 4 mg (4 mg Oral Given 04/12/23 0128)     Procedures  /  Critical Care Procedures  ED Course and Medical Decision Making  Initial Impression and Ddx Patient is well-appearing in no acute distress with normal vital signs, no signs of angioedema, no wheezing on exam, no obvious rash but still itchy.  Started a new collagen cream recently, may be allergic to this.  Had some nausea vomiting earlier but not currently.  Overall doubt anaphylaxis or emergent process at this time.  Past medical/surgical history that increases complexity of ED encounter: None  Interpretation of Diagnostics Laboratory and/or imaging options to aid in the diagnosis/care of the patient were considered.  After careful history and physical examination, it was determined that there was no indication for diagnostics at this time.  Patient Reassessment and Ultimate Disposition/Management     Discharge  Patient management required discussion with the following services or consulting groups:  None  Complexity of Problems Addressed Acute complicated illness or Injury  Additional Data Reviewed and Analyzed Further history obtained from: Past medical history and medications listed in the EMR  Additional Factors Impacting ED Encounter Risk Prescriptions  Elmer Sow. Pilar Plate, MD [facility] Emergency Medicine [facility] [email or web address]  Final Clinical Impressions(s) / ED Diagnoses     ICD-10-CM   1. Itching  L29.9     2. Nausea and vomiting, unspecified vomiting type  R11.2       ED Discharge Orders          Ordered    diphenhydrAMINE (BENADRYL) 25 MG tablet   Every 6 hours        04/12/23 0239    famotidine (PEPCID) 20 MG tablet  2 times daily        04/12/23 0239             Discharge Instructions Discussed with and Provided to Patient:    Discharge Instructions      You were evaluated in the Emergency Department and after careful evaluation, we did not find any emergent condition requiring admission or further testing in the hospital.  Your exam/testing today is overall reassuring.  Symptoms may have been due to an allergic reaction.  Recommend stopping her collagen cream.  Use the Benadryl every 4-6 hours as needed for itching.  Use the Pepcid every 12 hours as needed for itching.  Please return to the  Emergency Department if you experience any worsening of your condition.   Thank you for allowing [place] to be a part of your care.      Sabas Sous, MD 04/12/23 [phone]

## 2023-04-16 ENCOUNTER — Other Ambulatory Visit (INDEPENDENT_AMBULATORY_CARE_PROVIDER_SITE_OTHER): Payer: BC Managed Care – PPO

## 2023-04-16 ENCOUNTER — Other Ambulatory Visit: Payer: Self-pay | Admitting: Internal Medicine

## 2023-04-16 ENCOUNTER — Encounter: Payer: Self-pay | Admitting: Internal Medicine

## 2023-04-16 DIAGNOSIS — D649 Anemia, unspecified: Secondary | ICD-10-CM | POA: Diagnosis not present

## 2023-04-16 DIAGNOSIS — E559 Vitamin D deficiency, unspecified: Secondary | ICD-10-CM | POA: Diagnosis not present

## 2023-04-16 DIAGNOSIS — E782 Mixed hyperlipidemia: Secondary | ICD-10-CM

## 2023-04-16 LAB — LIPID PANEL
Cholesterol: 156 mg/dL (ref 0–200)
HDL: 48.4 mg/dL (ref >39.00)
LDL Cholesterol: 90 mg/dL (ref 0–99)
NonHDL: 107.11
Total CHOL/HDL Ratio: 3
Triglycerides: 85 mg/dL (ref 0.0–149.0)
VLDL: 17 mg/dL (ref 0.0–40.0)

## 2023-04-16 LAB — CBC WITH DIFFERENTIAL/PLATELET
Basophils Absolute: 0 10*3/uL (ref 0.0–0.1)
Basophils Relative: 0.6 % (ref 0.0–3.0)
Eosinophils Absolute: 0.2 10*3/uL (ref 0.0–0.7)
Eosinophils Relative: 6.2 % — ABNORMAL HIGH (ref 0.0–5.0)
HCT: 34.4 % — ABNORMAL LOW (ref 36.0–46.0)
Hemoglobin: 11.7 g/dL — ABNORMAL LOW (ref 12.0–15.0)
Lymphocytes Relative: 23.2 % (ref 12.0–46.0)
Lymphs Abs: 0.6 10*3/uL — ABNORMAL LOW (ref 0.7–4.0)
MCHC: 33.9 g/dL (ref 30.0–36.0)
MCV: 91.2 fl (ref 78.0–100.0)
Monocytes Absolute: 0.5 10*3/uL (ref 0.1–1.0)
Monocytes Relative: 18.5 % — ABNORMAL HIGH (ref 3.0–12.0)
Neutro Abs: 1.4 10*3/uL (ref 1.4–7.7)
Neutrophils Relative %: 51.5 % (ref 43.0–77.0)
Platelets: 241 10*3/uL (ref 150.0–400.0)
RBC: 3.78 Mil/uL — ABNORMAL LOW (ref 3.87–5.11)
RDW: 13.5 % (ref 11.5–15.5)
WBC: 2.7 10*3/uL — ABNORMAL LOW (ref 4.0–10.5)

## 2023-04-16 LAB — VITAMIN D 25 HYDROXY (VIT D DEFICIENCY, FRACTURES): VITD: 24 ng/mL — ABNORMAL LOW (ref 30.00–100.00)

## 2023-04-16 MED ORDER — VITAMIN D (ERGOCALCIFEROL) 1.25 MG (50000 UNIT) PO CAPS
50000.0000 [IU] | ORAL_CAPSULE | ORAL | 0 refills | Status: AC
Start: 2023-04-16 — End: 2023-07-03

## 2023-04-18 NOTE — Telephone Encounter (Signed)
 Patient decided against starting Tysabri.

## 2023-04-24 ENCOUNTER — Other Ambulatory Visit: Payer: Self-pay | Admitting: Neurology

## 2023-04-24 ENCOUNTER — Encounter: Payer: Self-pay | Admitting: Neurology

## 2023-04-24 ENCOUNTER — Ambulatory Visit (INDEPENDENT_AMBULATORY_CARE_PROVIDER_SITE_OTHER): Payer: BC Managed Care – PPO | Admitting: Neurology

## 2023-04-24 ENCOUNTER — Telehealth: Payer: Self-pay | Admitting: *Deleted

## 2023-04-24 VITALS — BP 102/60 | HR 72

## 2023-04-24 DIAGNOSIS — G35A Relapsing-remitting multiple sclerosis: Secondary | ICD-10-CM

## 2023-04-24 DIAGNOSIS — Z79899 Other long term (current) drug therapy: Secondary | ICD-10-CM | POA: Diagnosis not present

## 2023-04-24 DIAGNOSIS — G35D Multiple sclerosis, unspecified: Secondary | ICD-10-CM

## 2023-04-24 DIAGNOSIS — G35 Multiple sclerosis: Secondary | ICD-10-CM

## 2023-04-24 DIAGNOSIS — R269 Unspecified abnormalities of gait and mobility: Secondary | ICD-10-CM | POA: Diagnosis not present

## 2023-04-24 DIAGNOSIS — G4733 Obstructive sleep apnea (adult) (pediatric): Secondary | ICD-10-CM

## 2023-04-24 DIAGNOSIS — R5383 Other fatigue: Secondary | ICD-10-CM

## 2023-04-24 NOTE — Telephone Encounter (Signed)
 Refusing due to: 1.  She has had an exacerbation while on Mayzent.   She did better on Tysabri with no exacerbation.  She would like to go back to Tysabri and I concur.  She signed a service request form and we will check [facility] antibody and other tests

## 2023-04-24 NOTE — Telephone Encounter (Signed)
 Placed [facility] lab in [facility] lock box for routine lab pick up. Results pending.

## 2023-04-24 NOTE — Progress Notes (Addendum)
 [facility]  PATIENT: Amanda Wise DOB: 04/14/1985  REFERRING DOCTOR OR PCP: Aisha Seals SOURCE: Patient, notes from emergency room and hospital admission, laboratory reports, imaging reports, MRI images personally reviewed.  _________________________________   HISTORICAL  CHIEF COMPLAINT:  Chief Complaint  Patient presents with   Follow-up    Pt in 10 Pt here for MS f/u Pt states some dizziness and fatigue Pt states hasn't used pap machine in 4 years . Pt states last eye exam was 08/2022     HISTORY OF PRESENT ILLNESS:  Amanda Wise is a 38 y.o. woman who was diagnosed with relapsing remitting multiple sclerosis in December 2019.  Update 04/24/2023 She takes Mayzent  every other day as lymphocyte count was very low.     She feels her MS is stable and no exacerbation.  In 2023, she had  4-5 new lesions including an enhancing focus in the brain an one new focus in her cervical spine compared to previous MRIs.  Of note, she had missed a lot of dosesm in 2023.       In March 2024, despite improved compliance, she had an exacerbation with right arm numbness/pain and mild weakness lasting about 2 weeks.  Gait is mildly reduced but she is still active and goes to the gym some..  Balance is mildly reduced.  She does not always keep up with others for long distance but does for short..  She could easily do a mile but not at the same pace as she did a couple years ago.   She has been using the bannister on stairs.   Strength is weaker in left arm relative to right. .  She has some right sided numbness now..     Right facial myokymia resolved (ha at onset).      Vision is baseline and colors are symmetric.   Vision used to be a little better.   Her bladder is doing well.    Fatigue is often a problem.  Heat makes this wore     She has mild cognitive issues - forgetful, reduced focus/attention and some word finding issues..  Adderall had helped some but  increased her blood pressure.  Provigil  has helped some and is better tolerated..  She felt much better on CPAP but insurance did not cover it well and she opted not to stay  on CPAP.   She has lost almost 100 pounds with diet and Zepbound  so OSA is likely better.  No depression or anxiety,         EPWORTH SLEEPINESS SCALE  On a scale of 0 - 3 what is the chance of dozing:  Sitting and Reading:   2 Watching TV:    3 Sitting inactive in a public place: 1 Passenger in car for one hour: 3 Lying down to rest in the afternoon: 3 Sitting and talking to someone: 0 Sitting quietly after lunch:  1 In a car, stopped in traffic:  0  Total (out of 24):   13/24   Mild EDS   She has HTN and hyperlipidemia and OSA.      Vit D was low (24) and she is back on a high dose supplement  MS HISTORY: She had an episode of bilateral leg weakness in 2012, she improved over 4-5 months.  She felt symptoms were due to working out.  In 2016, she had the onset of poor balance x 3-6 months that completely resolved.    She never sought  out medical/neurologic care.  In August 2019, she had some weakness and numbness in her legs for a couple weeks but then improved after she saw a land.    In December 2019,  she had the onset of tingling in both hands and right hand weakness.   She went to the ED.  She had MRI of the brain and MRI of the cervical spine and they were consistent with MS.  She was admitted and received 5 days of IV Solu-Medrol .   She felt better by her discharge, closer to her baseline.   She went on Tysabri  initially, but due to cost switched to a siponimod  drug study in 2022.   She has been on siponimod /Mayzent  since January 2021.   She had a relapse with acute MRI changes 03/2021.  She was noncompliant at the time.  She had another exacerbation in early 2024 despite better compliance.  We had discussed Tysabri  but she wished to stay on Mayzent   IMAGING: MRI of the brain and cervical spine dated  01/30/2018.  The brain shows multiple T2/flair hyperintense foci in the hemispheres in the periventricular, juxtacortical and deep white matter.  There are 2 foci peripherally to the right in the pons and another focus in the left middle cerebellar peduncle.  The one pontine focus enhances.  2 small foci in the hemispheres enhance.  The MRI of the cervical spine shows several foci in the spinal cord including 1 at C3 to the left and 1 at C6-C7 on the right that enhance.    The other foci located at C2-C3 posteriorly and C3 posteriorly to the right.  MRI of the brain 05/23/2019 showed multiple T2/FLAIR hyperintense foci in the brainstem, cerebellum and cerebral hemispheres in a pattern and configuration consistent with chronic demyelinating plaque associated with multiple sclerosis. None of the foci appear to be acute. Compared to the MRI from 01/30/2018, there are no new lesions. Several foci that were enhancing in 2019 no longer do so.   MRI 03/24/2021 showed   Multiple T2/FLAIR hyperintense foci in the brainstem, cervical cerebellum, spinal cord and cerebral hemispheres in a pattern consistent with demyelinating plaque associated with multiple sclerosis.  1 focus in the left parietal lobe enhances consistent with an acute demyelinating plaque.  Additionally, there are 4 chronic plaques in the hemispheres seen on the current MRI that were not present on the MRI from 05/23/2019 consistent with plaques developing during the interim..  I called her to discuss changing DMT and she reported that compliance was poor .   She has since been very comliant with the Mayzent .     MRI cervical spine 03/08/2021 showed  Four T2 hyperintense foci within the spinal cord adjacent to C2-C3, C3-C4, C4 and C6-C7.  The foci at C2-C3, C4 C6-C7 were clearly present on the previous MRI and the C2-C3 and C6-C7 foci were larger and enhanced at that time.  The focus adjacent to C3-C4 posterolaterally to the right was not clearly present on  the previous MRI and could represent interval development of an additional plaque.  1 focus adjacent to C4-C5 to the right on the 01/30/2018 MRI was not clearly present on the current MRI  MRI of the brain 09/2021 showed no new lesions.  Other studies: Polysomnography 04/23/2018 showed mild overall AHI (10.6) that was severe during REM sleep (REM AHI equals 37.9).  There is no significant restless leg syndrome.   She tolerated CPAP (APAP) but had high copays.     REVIEW  OF SYSTEMS: Constitutional: No fevers, chills, sweats, or change in appetite Eyes: No visual changes, double vision, eye pain Ear, nose and throat: No hearing loss, ear pain, nasal congestion, sore throat Cardiovascular: No chest pain, palpitations Respiratory:  No shortness of breath at rest or with exertion.   No wheezes GastrointestinaI: No nausea, vomiting, diarrhea, abdominal pain, fecal incontinence Genitourinary:  No dysuria, urinary retention or frequency.  No nocturia. Musculoskeletal:  No neck pain, back pain Integumentary: No rash, pruritus, skin lesions Neurological: as above Psychiatric: No depression at this time.  No anxiety Endocrine: No palpitations, diaphoresis, change in appetite, change in weigh or increased thirst Hematologic/Lymphatic:  No anemia, purpura, petechiae. Allergic/Immunologic: No itchy/runny eyes, nasal congestion, recent allergic reactions, rashes  ALLERGIES: No Known Allergies  HOME MEDICATIONS:  Current Outpatient Medications:    atorvastatin  (LIPITOR) 20 MG tablet, Take 1 tablet (20 mg total) by mouth daily., Disp: 90 tablet, Rfl: 1   famotidine  (PEPCID ) 20 MG tablet, Take 1 tablet (20 mg total) by mouth 2 (two) times daily. (Patient not taking: Reported on 08/05/2023), Disp: 30 tablet, Rfl: 0   modafinil  (PROVIGIL ) 200 MG tablet, Take 1 tablet by mouth once daily (Patient not taking: Reported on 08/15/2023), Disp: 30 tablet, Rfl: 0   LORazepam  (ATIVAN ) 0.5 MG tablet, Take 0.5 mg by  mouth every 8 (eight) hours. (Patient not taking: Reported on 08/15/2023), Disp: , Rfl:    predniSONE  (DELTASONE ) 50 MG tablet, 12 po every day (600  mg daily) x 3 days (Patient not taking: Reported on 08/15/2023), Disp: 36 tablet, Rfl: 0   semaglutide -weight management (WEGOVY ) 2.4 MG/0.75ML SOAJ SQ injection, Inject 2.4 mg into the skin once a week., Disp: 3 mL, Rfl: 11   Siponimod  Fumarate (MAYZENT ) 2 MG TABS, Take 1 tablet (2 mg total) by mouth daily. (Patient not taking: Reported on 08/15/2023), Disp: 90 tablet, Rfl: 0   triamcinolone  cream (KENALOG ) 0.1 %, Apply 1 Application topically 2 (two) times daily., Disp: 453 g, Rfl: 0  PAST MEDICAL HISTORY: Past Medical History:  Diagnosis Date   Anemia    Heart palpitations    Hypertension    MS (multiple sclerosis)    Pregnancy induced hypertension     PAST SURGICAL HISTORY: Past Surgical History:  Procedure Laterality Date   NO PAST SURGERIES      FAMILY HISTORY: Family History  Problem Relation Age of Onset   Diabetes Father    High blood pressure Father    Hypertension Maternal Grandmother    Hypertension Paternal Grandmother    Hypertension Other    Multiple sclerosis Neg Hx     SOCIAL HISTORY:  Social History   Socioeconomic History   Marital status: Single    Spouse name: Not on file   Number of children: 2   Years of education: 14   Highest education level: Associate degree: occupational, scientist, product/process development, or vocational program  Occupational History   Occupation: Orthoptist  Tobacco Use   Smoking status: Never   Smokeless tobacco: Never  Vaping Use   Vaping status: Not on file  Substance and Sexual Activity   Alcohol use: No   Drug use: No   Sexual activity: Yes    Birth control/protection: None  Other Topics Concern   Not on file  Social History Narrative   Lives with family   Caffeine  use: soda sometimes   Right handed    Social Drivers of Health   Financial Resource Strain: Medium Risk  (01/25/2021)   Overall  Financial Resource Strain (CARDIA)    Difficulty of Paying Living Expenses: Somewhat hard  Food Insecurity: No Food Insecurity (01/25/2021)   Hunger Vital Sign    Worried About Running Out of Food in the Last Year: Never true    Ran Out of Food in the Last Year: Never true  Transportation Needs: Not on file  Physical Activity: Insufficiently Active (01/25/2021)   Exercise Vital Sign    Days of Exercise per Week: 3 days    Minutes of Exercise per Session: 30 min  Stress: Stress Concern Present (01/25/2021)   [employer] of Occupational Health - Occupational Stress Questionnaire    Feeling of Stress : Very much  Social Connections: Moderately Isolated (01/25/2021)   Social Connection and Isolation Panel    Frequency of Communication with Friends and Family: More than three times a week    Frequency of Social Gatherings with Friends and Family: Once a week    Attends Religious Services: Never    Database Administrator or Organizations: No    Attends Engineer, Structural: Not on file    Marital Status: Living with partner  Intimate Partner Violence: Not on file     PHYSICAL EXAM  Vitals:   04/24/23 0947  BP: 102/60  Pulse: 72  SpO2: 99%     There is no height or weight on file to calculate BMI.  No results found.   General: The patient is well-developed and well-nourished and in no acute distress.  Abdomen is nontender with normal sounds.  HEENT: Tympanic membranes were intact on the right.  There is a fair amount of wax in the left external ear canal and I could not clearly see the tympanic membrane.  Funduscopic exam shows normal optic discs and retinal vessels.   Skin: Extremities are without rash or edema.   Neurologic Exam  Mental status: The patient is alert and oriented x 3 at the time of the examination. The patient has apparent normal recent and remote memory, with an apparently normal attention span and concentration  ability.   Speech is normal.  Cranial nerves: Extraocular movements are full.  She has a mild right APD.  Color vision was symmetric, however.  Facial strength and sensation was normal.  There was no myokymia.  Although hearing was symmetric to the tuning fork, the Weber lateralized towards the left.  Motor:  Muscle bulk is normal.   Tone is normal. Strength is  5 / 5 in all 4 extremities.   Sensory: She has intact sensation to touch and vibration in the arms and legs.  Coordination: Cerebellar testing reveals good finger-nose-finger and heel-to-shin bilaterally.  Gait and station: Station is normal.  Her gait is fairly mild.  The tandem gait is mildly wide..  The Romberg is negative.  Reflexes: Deep tendon reflexes are symmetric and normal in arms and increased in legs without clonus.       DIAGNOSTIC DATA (LABS, IMAGING, TESTING) - I reviewed patient records, labs, notes, testing and imaging myself where available.  Lab Results  Component Value Date   WBC 2.7 (L) 07/21/2023   HGB 12.0 07/21/2023   HCT 34.5 (L) 07/21/2023   MCV 87.3 07/21/2023   PLT 253 07/21/2023      Component Value Date/Time   NA 139 07/21/2023 1656   NA 143 10/02/2022 1535   K 3.4 (L) 07/21/2023 1656   CL 102 07/21/2023 1656   CO2 21 (L) 07/21/2023 1656   GLUCOSE 80 07/21/2023  1656   BUN 8 07/21/2023 1656   BUN 10 10/02/2022 1535   CREATININE 0.89 07/21/2023 1656   CREATININE 1.06 (H) 02/16/2021 0856   CALCIUM  9.6 07/21/2023 1656   PROT 6.4 06/13/2023 1140   ALBUMIN 4.0 06/13/2023 1140   AST 15 06/13/2023 1140   ALT 25 06/13/2023 1140   ALKPHOS 57 06/13/2023 1140   BILITOT 0.6 06/13/2023 1140   GFRNONAA >60 07/21/2023 1656   GFRAA >60 12/22/2018 0240     ____________________________________________________________   1. Multiple sclerosis, relapsing-remitting   2. Multiple sclerosis   3. High risk medication use   4. OSA (obstructive sleep apnea)   5. Gait disturbance   6. Other fatigue    7. Neurologic gait dysfunction       1.  Although she has preferred to take pills over IVs, her MS is not as well-controlled and she has had a couple exacerbations while on Mayzent .  We discussed going back on Tysabri  or one of the anti-CD20 agents.  She would like to get back on Tysabri  and I had her sign a service request form.  She had been very stable when she was on that.   check JCV antibody and other tests 2.    Stay active and exercise as tolerated. 3.   She has lost weight since starting Zepbound .  Hopefully this will help with the OSA 4.   Continue the supplement Vit D (was 17 in past)  6.   rtc 6 months or sooner if new or worsening neurologic issues.  Jaidee Stipe A. Vear, MD, [facility] 12/17/2023, 12:17 PM Certified in Neurology, Clinical Neurophysiology, Sleep Medicine, Pain Medicine and Neuroimaging  [facility] [address] [place], [place] [address] [phone]

## 2023-04-25 LAB — CBC WITH DIFFERENTIAL/PLATELET
Basophils Absolute: 0 10*3/uL (ref 0.0–0.2)
Basos: 1 %
EOS (ABSOLUTE): 0.3 10*3/uL (ref 0.0–0.4)
Eos: 7 %
Hematocrit: 32.7 % — ABNORMAL LOW (ref 34.0–46.6)
Hemoglobin: 10.8 g/dL — ABNORMAL LOW (ref 11.1–15.9)
Immature Grans (Abs): 0 10*3/uL (ref 0.0–0.1)
Immature Granulocytes: 0 %
Lymphocytes Absolute: 0.4 10*3/uL — ABNORMAL LOW (ref 0.7–3.1)
Lymphs: 12 %
MCH: 30.8 pg (ref 26.6–33.0)
MCHC: 33 g/dL (ref 31.5–35.7)
MCV: 93 fL (ref 79–97)
Monocytes Absolute: 0.5 10*3/uL (ref 0.1–0.9)
Monocytes: 14 %
Neutrophils Absolute: 2.4 10*3/uL (ref 1.4–7.0)
Neutrophils: 66 %
Platelets: 276 10*3/uL (ref 150–450)
RBC: 3.51 x10E6/uL — ABNORMAL LOW (ref 3.77–5.28)
RDW: 12.9 % (ref 11.7–15.4)
WBC: 3.6 10*3/uL (ref 3.4–10.8)

## 2023-05-07 ENCOUNTER — Ambulatory Visit (INDEPENDENT_AMBULATORY_CARE_PROVIDER_SITE_OTHER)

## 2023-05-07 DIAGNOSIS — R269 Unspecified abnormalities of gait and mobility: Secondary | ICD-10-CM | POA: Diagnosis not present

## 2023-05-07 DIAGNOSIS — G35 Multiple sclerosis: Secondary | ICD-10-CM

## 2023-05-07 MED ORDER — GADOBENATE DIMEGLUMINE 529 MG/ML IV SOLN
20.0000 mL | Freq: Once | INTRAVENOUS | Status: AC | PRN
  Administered 2023-05-07: 20 mL via INTRAVENOUS

## 2023-05-08 ENCOUNTER — Telehealth: Payer: Self-pay | Admitting: *Deleted

## 2023-05-08 NOTE — Telephone Encounter (Signed)
 Called and spoke with pt about labs (labs fine per MD) and MRI results. Pt verbalized understanding.  Relayed she missed some spots on Tysabri start form she needs to sign/date before we send to [employer]. Agreeable for me to email and have her send back. I sent email to [email or web address]. I asked her to call in the morning if she does not receive.  Pt aware once I receive this back, I will fax to [employer]. [employer] will then call her to confirm info. I will then also give orders to [facility] for them to start working on getting insurance approval prior to getting her scheduled.

## 2023-05-08 NOTE — Telephone Encounter (Signed)
-----   Message from Amanda Wise sent at 05/08/2023  1:43 PM EDT ----- Please let her know that the MRI of the brain did show a couple new spots compared to her previous MRI including a couple of that look like they occurred recently.  She is JCV antibody negative.  Have we sent in the Tysabri form?

## 2023-05-13 NOTE — Telephone Encounter (Signed)
 Called pt because we have not received  completed start form back after emailing to her on 05/08/23. Trying to follow up o this.   LVM for pt to call office back.   I also resent email to pt, waiting on response.

## 2023-05-21 NOTE — Telephone Encounter (Signed)
 LVM for pt to call office

## 2023-06-05 ENCOUNTER — Telehealth: Payer: Self-pay | Admitting: Neurology

## 2023-06-05 DIAGNOSIS — Z79899 Other long term (current) drug therapy: Secondary | ICD-10-CM

## 2023-06-05 DIAGNOSIS — G35 Multiple sclerosis: Secondary | ICD-10-CM

## 2023-06-05 DIAGNOSIS — Z114 Encounter for screening for human immunodeficiency virus [HIV]: Secondary | ICD-10-CM

## 2023-06-05 NOTE — Telephone Encounter (Signed)
 I called pt at [phone] and was able to speak with her. She is hesitant to start Tysabri . Wanting to know if she can stay on Mayzent . I reviewed last OV note per MD and relayed: "Although she has preferred to take pills over IVs, her MS is not as well-controlled and she has had a couple exacerbations while on Mayzent ."  I explained it would be best to proceed with Tysabri . I asked her to elaborate on what concerns she had. She is worried about developing PML. I explained is low risk. Recent JCV negative. She verbalized understanding but still concerned.   She would appreciate a quick call from Dr. Godwin Lat to discuss further to decide what therapy is best and how to proceed.

## 2023-06-05 NOTE — Telephone Encounter (Signed)
 After more thought, she would prefer not to restart the Tysabri  due to the risk of PML.  We discussed options.  She has breakthrough activity on Mayzent  which is actually a fairly strong oral agent.  Therefore, besides Tysabri , the only other recommendations I would have would be the anti-CD20 agents.  We discussed Briumvi and Ocrevus and we will get her started on 1 of these.  She will come in sometime the next couple days to get the lab work.

## 2023-06-10 ENCOUNTER — Telehealth: Payer: Self-pay | Admitting: Neurology

## 2023-06-10 DIAGNOSIS — G35 Multiple sclerosis: Secondary | ICD-10-CM

## 2023-06-10 MED ORDER — MAYZENT 2 MG PO TABS: 1.0000 | ORAL_TABLET | Freq: Every day | ORAL | 0 refills | Status: DC

## 2023-06-10 NOTE — Telephone Encounter (Signed)
 Spoke w/ MD. He states: "Mayzent  has a very short lifespan so I want her to take until the day before her first infusion"  I e-scribed refill of Mayzent  and asked them to expedite for pt.

## 2023-06-10 NOTE — Telephone Encounter (Signed)
 Amanda Wise with [facility] called stating they have been requesting a refill on Mayzent . She is passed due. If you have any questions their phone number is [phone] & fax [phone].

## 2023-06-11 ENCOUNTER — Other Ambulatory Visit (HOSPITAL_COMMUNITY): Payer: Self-pay

## 2023-06-11 ENCOUNTER — Telehealth: Payer: Self-pay

## 2023-06-11 NOTE — Telephone Encounter (Signed)
 Called pt since she has not stopped by office yet to do labs/sign start forms. LVM.

## 2023-06-11 NOTE — Telephone Encounter (Signed)
 Pharmacy Patient Advocate Encounter   Received notification from CoverMyMeds that prior authorization for ZEPBOUND  15MG /.5ML is required/requested.   Insurance verification completed.   The patient is insured through [facility] .   Per test claim: PA required; PA submitted to above mentioned insurance via CoverMyMeds Key/confirmation #/EOC [id number] Status is pending

## 2023-06-11 NOTE — Telephone Encounter (Signed)
 Pharmacy Patient Advocate Encounter  Received notification from [facility] that Prior Authorization for ZEPBOUND  has been APPROVED from 06/11/23 to 06/10/24

## 2023-06-13 ENCOUNTER — Other Ambulatory Visit (INDEPENDENT_AMBULATORY_CARE_PROVIDER_SITE_OTHER): Payer: Self-pay

## 2023-06-13 DIAGNOSIS — Z79899 Other long term (current) drug therapy: Secondary | ICD-10-CM | POA: Diagnosis not present

## 2023-06-13 DIAGNOSIS — Z114 Encounter for screening for human immunodeficiency virus [HIV]: Secondary | ICD-10-CM

## 2023-06-13 DIAGNOSIS — G35 Multiple sclerosis: Secondary | ICD-10-CM

## 2023-06-13 DIAGNOSIS — Z0289 Encounter for other administrative examinations: Secondary | ICD-10-CM

## 2023-06-13 NOTE — Telephone Encounter (Signed)
LVM again for pt to call office. 

## 2023-06-13 NOTE — Telephone Encounter (Signed)
 Spoke to patient coming today to complete labwork and sign paperwork for new start form for Briumvi and Ocrevus Pt was very appreciative and thanked me for calling

## 2023-06-18 LAB — IGG, IGA, IGM
IgA/Immunoglobulin A, Serum: 98 mg/dL (ref 87–352)
IgG (Immunoglobin G), Serum: 1265 mg/dL (ref 586–1602)
IgM (Immunoglobulin M), Srm: 110 mg/dL (ref 26–217)

## 2023-06-18 LAB — HIV ANTIBODY (ROUTINE TESTING W REFLEX): HIV Screen 4th Generation wRfx: NONREACTIVE

## 2023-06-18 LAB — QUANTIFERON-TB GOLD PLUS
QuantiFERON Nil Value: 0.03 [IU]/mL
QuantiFERON TB1 Ag Value: 0.05 [IU]/mL
QuantiFERON TB2 Ag Value: 0.04 [IU]/mL

## 2023-06-18 LAB — ACUTE VIRAL HEPATITIS (HAV, HBV, HCV)
HCV Ab: NONREACTIVE
Hep A IgM: NEGATIVE
Hep B C IgM: NEGATIVE
Hepatitis B Surface Ag: NEGATIVE

## 2023-06-18 LAB — HEPATIC FUNCTION PANEL
ALT: 25 IU/L (ref 0–32)
AST: 15 IU/L (ref 0–40)
Albumin: 4 g/dL (ref 3.9–4.9)
Alkaline Phosphatase: 57 IU/L (ref 44–121)
Bilirubin Total: 0.6 mg/dL (ref 0.0–1.2)
Bilirubin, Direct: 0.21 mg/dL (ref 0.00–0.40)
Total Protein: 6.4 g/dL (ref 6.0–8.5)

## 2023-06-18 LAB — HCV INTERPRETATION

## 2023-06-18 LAB — VARICELLA ZOSTER ANTIBODY, IGG: Varicella zoster IgG: REACTIVE

## 2023-06-20 ENCOUNTER — Telehealth: Payer: Self-pay | Admitting: *Deleted

## 2023-06-20 NOTE — Telephone Encounter (Signed)
 LVM for pt to call office. Per Dr. Godwin Lat, labs look ok to proceed with getting her on Briumvi therapy. This is first option. If insurance denies, Ocrevus next option (she signed start form and in hold folder).

## 2023-06-21 ENCOUNTER — Other Ambulatory Visit (HOSPITAL_COMMUNITY): Payer: Self-pay

## 2023-06-24 ENCOUNTER — Telehealth: Payer: Self-pay | Admitting: Neurology

## 2023-06-24 ENCOUNTER — Other Ambulatory Visit: Payer: Self-pay | Admitting: Neurology

## 2023-06-24 MED ORDER — PREDNISONE 50 MG PO TABS: ORAL_TABLET | ORAL | 0 refills | Status: DC

## 2023-06-24 NOTE — Telephone Encounter (Signed)
 Pharmacy asking to speak with RN re: predniSONE  (DELTASONE ) 50 MG tablet  for pt

## 2023-06-24 NOTE — Telephone Encounter (Addendum)
 Took call from [place] in phone room. Spoke w/  Pattie Borders from [employer]. Confirmed prednisone  50mg , 12 tabs po qdx3 days correct. They will get ready for pt.   I called pt.  Relayed Dr. Thom Fleeting recommendation. She verbalized understanding and agreeable to plan. She will make sure to split tablets up throughout day (aware she does not have to take all 12 tabs at once) and will make sure to take with food.

## 2023-06-24 NOTE — Telephone Encounter (Signed)
 Took call from Jael and spoke w/ pt. Relayed mychart message. She verbalized understanding.  She reports the following sx that started 06/20/23 after working out: left arm weakness/increased fatigue. Sx constant. Denies any current illness/infection. Still taking Mayzent  2mg  po every day. Has missed 1-2 doses in last week. Has not started any other new meds recently. Aware I will speak with MD and call back.  Spoke w/ MD after ending call w/ pt. He sent in prednisone  50mg , take 12 tabs po every day x3 days. If sx do not improve after this, she should call back.

## 2023-06-24 NOTE — Telephone Encounter (Signed)
 LVM for pt to call office. Sent mychart.  Faxed completed/signed Briumvi start form to [facility] Patient [employer] at [phone]. Received fax confirmation.

## 2023-07-19 ENCOUNTER — Telehealth: Payer: Self-pay

## 2023-07-19 NOTE — Telephone Encounter (Signed)
 Brajna cld from [facility] inquiring on Mayzent  stating PA expires 07/19/23. Brajna stated a PA request was sent 5/4 but no response has been received at pharmacy. She verified fax # stating will send again if Pt is to continue med. CoverMyMeds Key code is [id number].

## 2023-07-21 ENCOUNTER — Emergency Department (HOSPITAL_BASED_OUTPATIENT_CLINIC_OR_DEPARTMENT_OTHER): Admitting: Radiology

## 2023-07-21 ENCOUNTER — Emergency Department (HOSPITAL_BASED_OUTPATIENT_CLINIC_OR_DEPARTMENT_OTHER)
Admission: EM | Admit: 2023-07-21 | Discharge: 2023-07-21 | Disposition: A | Discharge status: Home / Self Care | Attending: Emergency Medicine | Admitting: Emergency Medicine

## 2023-07-21 ENCOUNTER — Other Ambulatory Visit: Payer: Self-pay

## 2023-07-21 DIAGNOSIS — R0602 Shortness of breath: Secondary | ICD-10-CM | POA: Insufficient documentation

## 2023-07-21 DIAGNOSIS — E876 Hypokalemia: Secondary | ICD-10-CM | POA: Insufficient documentation

## 2023-07-21 DIAGNOSIS — F419 Anxiety disorder, unspecified: Secondary | ICD-10-CM | POA: Diagnosis not present

## 2023-07-21 DIAGNOSIS — R0789 Other chest pain: Secondary | ICD-10-CM | POA: Diagnosis not present

## 2023-07-21 DIAGNOSIS — R002 Palpitations: Secondary | ICD-10-CM | POA: Diagnosis not present

## 2023-07-21 DIAGNOSIS — I10 Essential (primary) hypertension: Secondary | ICD-10-CM | POA: Diagnosis not present

## 2023-07-21 DIAGNOSIS — R000 Tachycardia, unspecified: Secondary | ICD-10-CM | POA: Insufficient documentation

## 2023-07-21 DIAGNOSIS — D72819 Decreased white blood cell count, unspecified: Secondary | ICD-10-CM | POA: Insufficient documentation

## 2023-07-21 DIAGNOSIS — R457 State of emotional shock and stress, unspecified: Secondary | ICD-10-CM | POA: Diagnosis not present

## 2023-07-21 DIAGNOSIS — R079 Chest pain, unspecified: Secondary | ICD-10-CM | POA: Diagnosis not present

## 2023-07-21 LAB — CBC
HCT: 34.5 % — ABNORMAL LOW (ref 36.0–46.0)
Hemoglobin: 12 g/dL (ref 12.0–15.0)
MCH: 30.4 pg (ref 26.0–34.0)
MCHC: 34.8 g/dL (ref 30.0–36.0)
MCV: 87.3 fL (ref 80.0–100.0)
Platelets: 253 10*3/uL (ref 150–400)
RBC: 3.95 MIL/uL (ref 3.87–5.11)
RDW: 13.2 % (ref 11.5–15.5)
WBC: 2.7 10*3/uL — ABNORMAL LOW (ref 4.0–10.5)
nRBC: 0 % (ref 0.0–0.2)

## 2023-07-21 LAB — BASIC METABOLIC PANEL WITH GFR
Anion gap: 17 — ABNORMAL HIGH (ref 5–15)
BUN: 8 mg/dL (ref 6–20)
CO2: 21 mmol/L — ABNORMAL LOW (ref 22–32)
Calcium: 9.6 mg/dL (ref 8.9–10.3)
Chloride: 102 mmol/L (ref 98–111)
Creatinine, Ser: 0.89 mg/dL (ref 0.44–1.00)
GFR, Estimated: >60 mL/min (ref >60)
Glucose, Bld: 80 mg/dL (ref 70–99)
Potassium: 3.4 mmol/L — ABNORMAL LOW (ref 3.5–5.1)
Sodium: 139 mmol/L (ref 135–145)

## 2023-07-21 LAB — TROPONIN T, HIGH SENSITIVITY
Troponin T High Sensitivity: <15 ng/L (ref <19)
Troponin T High Sensitivity: <15 ng/L (ref <19)

## 2023-07-21 LAB — PREGNANCY, URINE: Preg Test, Ur: NEGATIVE

## 2023-07-21 MED ORDER — LORAZEPAM 1 MG PO TABS
1.0000 mg | ORAL_TABLET | Freq: Once | ORAL | Status: AC
  Administered 2023-07-21: 1 mg via ORAL
  Filled 2023-07-21: qty 1

## 2023-07-21 NOTE — ED Provider Notes (Signed)
 [place] EMERGENCY DEPARTMENT AT [facility] Provider Note   CSN: [id number] Arrival date & time: 07/21/23  1645     Patient presents with: Chest Pain   Navika Hoopes is a 38 y.o. female with past medical history significant for hypertension, hyperlipidemia, multiple sclerosis, anxiety who presents concern for mid chest pain, shortness of breath, anxiety, palpitations intermittently since 11 AM.  She reports episode last for a few seconds at a time before resolving spontaneously, nothing seems to trigger it.    Chest Pain      Prior to Admission medications   Medication Sig Start Date End Date Taking? Authorizing Provider  atorvastatin  (LIPITOR) 20 MG tablet Take 1 tablet (20 mg total) by mouth daily. 01/14/23   Zilphia Hilt, Charyl Coppersmith, MD  famotidine  (PEPCID ) 20 MG tablet Take 1 tablet (20 mg total) by mouth 2 (two) times daily. 04/12/23   Edson Graces, MD  modafinil  (PROVIGIL ) 200 MG tablet Take 1 tablet by mouth once daily Patient taking differently: Take 200 mg by mouth as needed. 12/12/22   Sater, Sherida Dimmer, MD  predniSONE  (DELTASONE ) 50 MG tablet 12 po every day (600  mg daily) x 3 days 06/24/23   Sater, Sherida Dimmer, MD  Siponimod  Fumarate (MAYZENT ) 2 MG TABS Take 1 tablet (2 mg total) by mouth daily. 06/10/23   Sater, Sherida Dimmer, MD  tirzepatide  (ZEPBOUND ) 15 MG/0.5ML Pen Inject 15 mg into the skin once a week. 02/25/23   Jann Melody, MD    Allergies: Patient has no known allergies.    Review of Systems  Cardiovascular:  Positive for chest pain.  All other systems reviewed and are negative.   Updated Vital Signs BP 127/84   Pulse 86   Temp 97.8 F (36.6 C) (Temporal)   Resp 17   Ht 5' 7 (1.702 m)   Wt 89.4 kg   SpO2 100%   BMI 30.85 kg/m   Physical Exam Vitals and nursing note reviewed.  Constitutional:      General: She is not in acute distress.    Appearance: Normal appearance.  HENT:     Head: Normocephalic and atraumatic.    Eyes:     General:        Right eye: No discharge.        Left eye: No discharge.    Cardiovascular:     Rate and Rhythm: Normal rate and regular rhythm.     Heart sounds: No murmur heard.    No friction rub. No gallop.     Comments: Mild tachycardia, normal rhythm Pulmonary:     Effort: Pulmonary effort is normal.     Breath sounds: Normal breath sounds.  Abdominal:     General: Bowel sounds are normal.     Palpations: Abdomen is soft.   Skin:    General: Skin is warm and dry.     Capillary Refill: Capillary refill takes less than 2 seconds.   Neurological:     Mental Status: She is alert and oriented to person, place, and time.   Psychiatric:        Mood and Affect: Mood normal.        Behavior: Behavior normal.     Comments: Anxious mood, but appropriate speech, thought patterns.     (all labs ordered are listed, but only abnormal results are displayed) Labs Reviewed  BASIC METABOLIC PANEL WITH GFR - Abnormal; Notable for the following components:      Result Value  Potassium 3.4 (*)    CO2 21 (*)    Anion gap 17 (*)    All other components within normal limits  CBC - Abnormal; Notable for the following components:   WBC 2.7 (*)    HCT 34.5 (*)    All other components within normal limits  PREGNANCY, URINE  TROPONIN T, HIGH SENSITIVITY  TROPONIN T, HIGH SENSITIVITY    EKG: EKG Interpretation Date/Time:  Sunday July 21 2023 17:27:40 EDT Ventricular Rate:  95 PR Interval:  167 QRS Duration:  81 QT Interval:  412 QTC Calculation: 518 R Axis:   73  Text Interpretation: Sinus rhythm Borderline T abnormalities, anterior leads new Prolonged QT interval Confirmed by Almond Army ([id number]) on 07/21/2023 6:40:11 PM  Radiology: Lenell Query Chest 2 View Result Date: 07/21/2023 CLINICAL DATA:  Intermittent chest pain and shortness of breath since 11 a.m. EXAM: CHEST - 2 VIEW COMPARISON:  08/22/2021 FINDINGS: Frontal and lateral views of the chest demonstrate an  unremarkable cardiac silhouette. No acute airspace disease, effusion, or pneumothorax. No acute bony abnormalities. IMPRESSION: 1. No acute intrathoracic process. Electronically Signed   By: Bobbye Burrow M.D.   On: 07/21/2023 18:49     Procedures   Medications Ordered in the ED  LORazepam  (ATIVAN ) tablet 1 mg (1 mg Oral Given 07/21/23 1839)                                    Medical Decision Making Amount and/or Complexity of Data Reviewed Labs: ordered. Radiology: ordered.  Risk Prescription drug management.   This patient is a 38 y.o. female  who presents to the ED for concern of chest pain, shob.   Differential diagnoses prior to evaluation: The emergent differential diagnosis includes, but is not limited to,  ACS, AAS, PE, Mallory-Weiss, Boerhaave's, Pneumonia, acute bronchitis, asthma or COPD exacerbation, anxiety, MSK pain or traumatic injury to the chest, acid reflux versus other . This is not an exhaustive differential.   Past Medical History / Co-morbidities / Social History: hypertension, hyperlipidemia, multiple sclerosis, anxiety  Physical Exam: Physical exam performed. The pertinent findings include: Patient overall well-appearing on exam, she has some tachycardia with normal rhythm on initial assessment, normal BP on arrival but has developed some hypertension on reassessment, blood pressure 130/94 on recheck.  Lab Tests/Imaging studies: I personally interpreted labs/imaging and the pertinent results include: CBC with mild leukocytopenia, blood cells 2.7, otherwise unremarkable, BMP notable for mild hypokalemia potassium 3.4, she does have an anion gap of 17, she has mild bicarb deficit, suspect this may be secondary to hyperventilation prior to arrival.  Plain film chest x-ray with no evidence of acute intrathoracic abnormality.  Initial troponin normal, repeat pending at time of handoff. I agree with the radiologist interpretation.  Cardiac monitoring: EKG  obtained and interpreted by myself and attending physician which shows: Normal sinus rhythm, borderline T wave abnormalities, prolonged QT   Medications: I ordered medication including Ativan  for anxiety.  I have reviewed the patients home medicines and have made adjustments as needed.   7:05 PM Care of Dawnya Grams transferred to [facility] and Dr. Almond Army at the end of my shift as the patient will require reassessment once labs/imaging have resulted. Patient presentation, ED course, and plan of care discussed with review of all pertinent labs and imaging. Please see his/her note for further details regarding further ED course and disposition. Plan  at time of handoff is reassess after Ativan , delta troponin, likely stable for discharge, suspect secondary to anxiety. This may be altered or completely changed at the discretion of the oncoming team pending results of further workup.   Final diagnoses:  Anxiety  Atypical chest pain    ED Discharge Orders     None          Stefan Edge 07/21/23 Robie Cho, MD 07/21/23 2330

## 2023-07-21 NOTE — ED Triage Notes (Signed)
 Pt POV reporting intermittent mid chest pain and SOB since 11 am. Went to UC, EKG showed T wave inversions, advised to come to ED for further evaluation. Pt also reports hx anxiety, states this feels similar to episodes in the past.

## 2023-07-21 NOTE — Discharge Instructions (Addendum)
 Please follow-up with your PCP or with the psychiatric resources that are provided to discuss some treatment for your anxiety, please return if you have worsening chest pain, shortness of breath.  It was a pleasure taking care of you today.

## 2023-07-22 ENCOUNTER — Telehealth: Payer: Self-pay | Admitting: *Deleted

## 2023-07-22 DIAGNOSIS — R9431 Abnormal electrocardiogram [ECG] [EKG]: Secondary | ICD-10-CM | POA: Diagnosis not present

## 2023-07-22 NOTE — Transitions of Care (Post Inpatient/ED Visit) (Signed)
   07/22/2023  Name: Amanda Wise MRN: [id number] DOB: Nov 13, 1985  Today's TOC FU Call Status: Today's TOC FU Call Status:: Successful TOC FU Call Completed TOC FU Call Complete Date: 07/22/23 Patient's Name and Date of Birth confirmed.  Transition Care Management Follow-up Telephone Call Date of Discharge: 07/21/23 Discharge Facility: [facility]) Type of Discharge: Emergency Department Reason for ED Visit: Cardiac Conditions, Mental or Mood Disorder Mental or Mood Disorder Diagnosis: Other Mental Health Disorder Cardiac Conditions Diagnosis: Chest Pain Persisting How have you been since you were released from the hospital?: Better Any questions or concerns?: No  Items Reviewed: Did you receive and understand the discharge instructions provided?: Yes Any new allergies since your discharge?: No Dietary orders reviewed?: NA Do you have support at home?: Yes People in Home [RPT]: parent(s)  Medications Reviewed Today: Medications Reviewed Today   Medications were not reviewed in this encounter     Home Care and Equipment/Supplies: Were Home Health Services Ordered?: No Any new equipment or medical supplies ordered?: No  Functional Questionnaire: Do you need assistance with bathing/showering or dressing?: No Do you need assistance with meal preparation?: No Do you need assistance with eating?: No Do you have difficulty maintaining continence: No Do you need assistance with getting out of bed/getting out of a chair/moving?: No Do you have difficulty managing or taking your medications?: No  Follow up appointments reviewed: PCP Follow-up appointment confirmed?: Yes Date of PCP follow-up appointment?: 08/05/03 Follow-up Provider: [facility] Follow-up appointment confirmed?: NA Do you need transportation to your follow-up appointment?: No Do you understand care options if your condition(s) worsen?: Yes-patient verbalized  understanding    SIGNATURE: Nicolina Barrios CMA

## 2023-07-30 ENCOUNTER — Ambulatory Visit: Payer: BC Managed Care – PPO | Admitting: Internal Medicine

## 2023-07-30 DIAGNOSIS — G35 Multiple sclerosis: Secondary | ICD-10-CM | POA: Diagnosis not present

## 2023-08-01 ENCOUNTER — Telehealth: Payer: Self-pay | Admitting: Neurology

## 2023-08-01 NOTE — Telephone Encounter (Signed)
 Received fax from [facility] that [facility]  denied. However, pt now on [facility] therapy. No longer on [facility]  therapy.

## 2023-08-01 NOTE — Telephone Encounter (Signed)
 LVM and sent MyChart message requesting pt call back and schedule f/u with Dr. Vear. Offered 10/21/23 @ 2:30 pm per Maurilio, RN.

## 2023-08-05 ENCOUNTER — Encounter: Payer: Self-pay | Admitting: Internal Medicine

## 2023-08-05 ENCOUNTER — Ambulatory Visit (INDEPENDENT_AMBULATORY_CARE_PROVIDER_SITE_OTHER): Admitting: Internal Medicine

## 2023-08-05 VITALS — BP 120/78 | HR 85 | Temp 98.3°F | Wt 198.1 lb

## 2023-08-05 DIAGNOSIS — F411 Generalized anxiety disorder: Secondary | ICD-10-CM

## 2023-08-05 DIAGNOSIS — E559 Vitamin D deficiency, unspecified: Secondary | ICD-10-CM

## 2023-08-05 DIAGNOSIS — I10 Essential (primary) hypertension: Secondary | ICD-10-CM

## 2023-08-05 DIAGNOSIS — E782 Mixed hyperlipidemia: Secondary | ICD-10-CM | POA: Diagnosis not present

## 2023-08-05 NOTE — Progress Notes (Signed)
 Established Patient Office Visit     CC/Reason for Visit: Follow-up chronic conditions, requesting labs  HPI: Amanda Wise is a 38 y.o. female who is coming in today for the above mentioned reasons. Past Medical History is significant for: Hyperlipidemia, vitamin D  deficiency, hypertension, multiple sclerosis, obesity.  She has lost about 160 pounds on Tirzepatide .  She has been having issues with anxiety, finds herself crying often.   Past Medical/Surgical History: Past Medical History:  Diagnosis Date   Anemia    Heart palpitations    Hypertension    MS (multiple sclerosis) (HCC)    Pregnancy induced hypertension     Past Surgical History:  Procedure Laterality Date   NO PAST SURGERIES      Social History:  reports that she has never smoked. She has never used smokeless tobacco. She reports that she does not drink alcohol and does not use drugs.  Allergies: No Known Allergies  Family History:  Family History  Problem Relation Age of Onset   Diabetes Father    High blood pressure Father    Hypertension Maternal Grandmother    Hypertension Paternal Grandmother    Hypertension Other    Multiple sclerosis Neg Hx      Current Outpatient Medications:    atorvastatin  (LIPITOR) 20 MG tablet, Take 1 tablet (20 mg total) by mouth daily., Disp: 90 tablet, Rfl: 1   tirzepatide  (ZEPBOUND ) 15 MG/0.5ML Pen, Inject 15 mg into the skin once a week., Disp: 2 mL, Rfl: 11   famotidine  (PEPCID ) 20 MG tablet, Take 1 tablet (20 mg total) by mouth 2 (two) times daily. (Patient not taking: Reported on 08/05/2023), Disp: 30 tablet, Rfl: 0   LORazepam  (ATIVAN ) 0.5 MG tablet, Take 0.5 mg by mouth every 8 (eight) hours. (Patient not taking: Reported on 08/05/2023), Disp: , Rfl:    modafinil  (PROVIGIL ) 200 MG tablet, Take 1 tablet by mouth once daily (Patient not taking: Reported on 08/05/2023), Disp: 30 tablet, Rfl: 0   predniSONE  (DELTASONE ) 50 MG tablet, 12 po every day (600  mg  daily) x 3 days (Patient not taking: Reported on 08/05/2023), Disp: 36 tablet, Rfl: 0   Siponimod  Fumarate (MAYZENT ) 2 MG TABS, Take 1 tablet (2 mg total) by mouth daily. (Patient not taking: Reported on 08/05/2023), Disp: 90 tablet, Rfl: 0  Review of Systems:  Negative unless indicated in HPI.   Physical Exam: Vitals:   08/05/23 1453  BP: 120/78  Pulse: 85  Temp: 98.3 F (36.8 C)  TempSrc: Oral  SpO2: 99%  Weight: 198 lb 1.6 oz (89.9 kg)    Body mass index is 31.03 kg/m.   Physical Exam Vitals reviewed.  Constitutional:      Appearance: Normal appearance.  HENT:     Head: Normocephalic and atraumatic.   Eyes:     Conjunctiva/sclera: Conjunctivae normal.    Cardiovascular:     Rate and Rhythm: Normal rate and regular rhythm.  Pulmonary:     Effort: Pulmonary effort is normal.     Breath sounds: Normal breath sounds.   Skin:    General: Skin is warm and dry.   Neurological:     General: No focal deficit present.     Mental Status: She is alert and oriented to person, place, and time.   Psychiatric:        Mood and Affect: Mood normal.        Behavior: Behavior normal.        Thought  Content: Thought content normal.        Judgment: Judgment normal.     Flowsheet Row Office Visit from 08/05/2023 in [facility] [place]  PHQ-9 Total Score 15     Impression and Plan:  Vitamin D  deficiency -     VITAMIN D  25 Hydroxy (Vit-D Deficiency, Fractures); Future  Mixed hyperlipidemia -     Lipid panel; Future  Essential hypertension  GAD (generalized anxiety disorder)   - Check vitamin D  and cholesterol today. - Blood pressure is well-controlled. - Congratulated on continued weight loss.  We have discussed increasing protein sources as well as weight training to conserve muscle mass. - I have given her information on how to schedule CBT.  Time spent:30 minutes reviewing chart, interviewing and examining patient and formulating plan  of care.     Tully Theophilus Andrews, MD [place] [facility]

## 2023-08-06 ENCOUNTER — Ambulatory Visit: Payer: Self-pay | Admitting: Internal Medicine

## 2023-08-06 LAB — VITAMIN D 25 HYDROXY (VIT D DEFICIENCY, FRACTURES): VITD: 42.37 ng/mL (ref 30.00–100.00)

## 2023-08-06 LAB — LIPID PANEL
Cholesterol: 178 mg/dL (ref 0–200)
HDL: 60.6 mg/dL (ref >39.00)
LDL Cholesterol: 107 mg/dL — ABNORMAL HIGH (ref 0–99)
NonHDL: 117.32
Total CHOL/HDL Ratio: 3
Triglycerides: 53 mg/dL (ref 0.0–149.0)
VLDL: 10.6 mg/dL (ref 0.0–40.0)

## 2023-08-07 ENCOUNTER — Telehealth: Payer: Self-pay | Admitting: *Deleted

## 2023-08-07 NOTE — Telephone Encounter (Signed)
 Received completed forms, faxed to [employer] placed copy up front for pt to pick up called and let pt know.

## 2023-08-07 NOTE — Telephone Encounter (Signed)
Gave completed/signed form back to MR to process for pt. °

## 2023-08-13 DIAGNOSIS — G35 Multiple sclerosis: Secondary | ICD-10-CM | POA: Diagnosis not present

## 2023-08-15 ENCOUNTER — Ambulatory Visit: Admitting: Internal Medicine

## 2023-08-15 ENCOUNTER — Encounter: Payer: Self-pay | Admitting: Internal Medicine

## 2023-08-15 VITALS — BP 120/74 | HR 76 | Temp 98.0°F | Ht 66.5 in | Wt 201.0 lb

## 2023-08-15 DIAGNOSIS — L309 Dermatitis, unspecified: Secondary | ICD-10-CM | POA: Diagnosis not present

## 2023-08-15 MED ORDER — TRIAMCINOLONE ACETONIDE 0.1 % EX CREA
1.0000 | TOPICAL_CREAM | Freq: Two times a day (BID) | CUTANEOUS | 0 refills | Status: AC
Start: 2023-08-15 — End: ?

## 2023-08-15 NOTE — Progress Notes (Signed)
     Established Patient Office Visit     CC/Reason for Visit: Forearm rash  HPI: Amanda Wise is a 38 y.o. female who is coming in today for the above mentioned reasons.  For the past few weeks has been noticing a hyperpigmented, scaly, pruritic area over her right elbow extending down into her forearm.   Past Medical/Surgical History: Past Medical History:  Diagnosis Date   Anemia    Heart palpitations    Hypertension    MS (multiple sclerosis) (HCC)    Pregnancy induced hypertension     Past Surgical History:  Procedure Laterality Date   NO PAST SURGERIES      Social History:  reports that she has never smoked. She has never used smokeless tobacco. She reports that she does not drink alcohol and does not use drugs.  Allergies: No Known Allergies  Family History:  Family History  Problem Relation Age of Onset   Diabetes Father    High blood pressure Father    Hypertension Maternal Grandmother    Hypertension Paternal Grandmother    Hypertension Other    Multiple sclerosis Neg Hx      Current Outpatient Medications:    atorvastatin  (LIPITOR) 20 MG tablet, Take 1 tablet (20 mg total) by mouth daily., Disp: 90 tablet, Rfl: 1   tirzepatide  (ZEPBOUND ) 15 MG/0.5ML Pen, Inject 15 mg into the skin once a week., Disp: 2 mL, Rfl: 11   triamcinolone  cream (KENALOG ) 0.1 %, Apply 1 Application topically 2 (two) times daily., Disp: 453 g, Rfl: 0   famotidine  (PEPCID ) 20 MG tablet, Take 1 tablet (20 mg total) by mouth 2 (two) times daily. (Patient not taking: Reported on 08/05/2023), Disp: 30 tablet, Rfl: 0   LORazepam  (ATIVAN ) 0.5 MG tablet, Take 0.5 mg by mouth every 8 (eight) hours. (Patient not taking: Reported on 08/15/2023), Disp: , Rfl:    modafinil  (PROVIGIL ) 200 MG tablet, Take 1 tablet by mouth once daily (Patient not taking: Reported on 08/15/2023), Disp: 30 tablet, Rfl: 0   predniSONE  (DELTASONE ) 50 MG tablet, 12 po every day (600  mg daily) x 3 days (Patient  not taking: Reported on 08/15/2023), Disp: 36 tablet, Rfl: 0   Siponimod  Fumarate (MAYZENT ) 2 MG TABS, Take 1 tablet (2 mg total) by mouth daily. (Patient not taking: Reported on 08/15/2023), Disp: 90 tablet, Rfl: 0  Review of Systems:  Negative unless indicated in HPI.   Physical Exam: Vitals:   08/15/23 1117  BP: 120/74  Pulse: 76  Temp: 98 F (36.7 C)  SpO2: 96%  Weight: 201 lb (91.2 kg)  Height: 5' 6.5 (1.689 m)    Body mass index is 31.96 kg/m.    Impression and Plan:  Eczema, unspecified type -     Triamcinolone  Acetonide; Apply 1 Application topically 2 (two) times daily.  Dispense: 453 g; Refill: 0   - This is likely eczema.  Will treat with triamcinolone  cream.  Time spent:20 minutes reviewing chart, interviewing and examining patient and formulating plan of care.     Tully Theophilus Andrews, MD [place] [facility] at [facility]

## 2023-08-16 ENCOUNTER — Telehealth: Payer: Self-pay | Admitting: Pharmacy Technician

## 2023-08-16 ENCOUNTER — Other Ambulatory Visit (HOSPITAL_COMMUNITY): Payer: Self-pay

## 2023-08-16 ENCOUNTER — Other Ambulatory Visit (HOSPITAL_BASED_OUTPATIENT_CLINIC_OR_DEPARTMENT_OTHER): Payer: Self-pay

## 2023-08-16 MED ORDER — WEGOVY 2.4 MG/0.75ML ~~LOC~~ SOAJ
2.4000 mg | SUBCUTANEOUS | 11 refills | Status: DC
  Filled 2023-08-16 – 2023-09-28 (×2): qty 3, 28d supply, fill #0

## 2023-08-16 NOTE — Telephone Encounter (Signed)
     Hi, insurance is saying zepbound  would no longer be covered and the alternative would be wegovy . Can the patient be changed to wegovy ? Per insurance wegovy  would be free

## 2023-08-19 ENCOUNTER — Encounter: Payer: Self-pay | Admitting: Neurology

## 2023-08-19 NOTE — Telephone Encounter (Signed)
 Have completed/signed updated form back to MR to process for pt.   Dr. Vear approved changing #6 for appt to 1 every 4 months, 2-3 hours per appt and #7 to 6 times per year, 1-4 days per episode. 08/01/23-08/06/24.

## 2023-08-26 ENCOUNTER — Other Ambulatory Visit: Payer: Self-pay | Admitting: Neurology

## 2023-08-26 DIAGNOSIS — G35 Multiple sclerosis: Secondary | ICD-10-CM

## 2023-08-26 NOTE — Telephone Encounter (Signed)
 Last seen on 04/24/23 Follow up scheduled 10/21/23

## 2023-09-28 ENCOUNTER — Other Ambulatory Visit (HOSPITAL_COMMUNITY): Payer: Self-pay

## 2023-09-30 ENCOUNTER — Other Ambulatory Visit: Payer: Self-pay

## 2023-10-01 ENCOUNTER — Other Ambulatory Visit (HOSPITAL_COMMUNITY): Payer: Self-pay

## 2023-10-02 ENCOUNTER — Other Ambulatory Visit: Payer: Self-pay

## 2023-10-11 ENCOUNTER — Ambulatory Visit: Admitting: Clinical

## 2023-10-14 ENCOUNTER — Telehealth: Payer: Self-pay | Admitting: Neurology

## 2023-10-14 NOTE — Telephone Encounter (Signed)
 Pt called to cancel appt due to not having Insurance   Appt Cancel

## 2023-10-21 ENCOUNTER — Ambulatory Visit: Admitting: Neurology

## 2023-11-29 ENCOUNTER — Ambulatory Visit: Admitting: Clinical

## 2023-12-17 ENCOUNTER — Telehealth: Payer: Self-pay | Admitting: Neurology

## 2023-12-17 ENCOUNTER — Encounter: Payer: Self-pay | Admitting: Neurology

## 2023-12-17 NOTE — Telephone Encounter (Signed)
 MS type not specified in last note. Please update note and specify ICD 10 code. Thank you!

## 2023-12-17 NOTE — Telephone Encounter (Signed)
 G35.A RRMS

## 2023-12-17 NOTE — Telephone Encounter (Signed)
 Amanda Wise with [facility] called to speak to Nurse about Pt medication  need updates Amanda Wise states  she need  missing ICD  10 coded on medication order for Pt. As of 10-1- 25  all  ICD 10  has changes for MS ,  [phone]

## 2024-03-03 ENCOUNTER — Ambulatory Visit: Admitting: Internal Medicine

## 2024-03-03 ENCOUNTER — Telehealth: Payer: Self-pay | Admitting: Neurology

## 2024-03-03 ENCOUNTER — Encounter: Payer: Self-pay | Admitting: Internal Medicine

## 2024-03-03 VITALS — BP 120/70 | HR 90 | Temp 97.9°F | Wt 233.2 lb

## 2024-03-03 DIAGNOSIS — I10 Essential (primary) hypertension: Secondary | ICD-10-CM | POA: Diagnosis not present

## 2024-03-03 DIAGNOSIS — R7401 Elevation of levels of liver transaminase levels: Secondary | ICD-10-CM | POA: Diagnosis not present

## 2024-03-03 DIAGNOSIS — Z6837 Body mass index (BMI) 37.0-37.9, adult: Secondary | ICD-10-CM | POA: Diagnosis not present

## 2024-03-03 DIAGNOSIS — E782 Mixed hyperlipidemia: Secondary | ICD-10-CM

## 2024-03-03 LAB — CBC WITH DIFFERENTIAL/PLATELET
Basophils Absolute: 0 10*3/uL (ref 0.0–0.1)
Basophils Relative: 1 % (ref 0.0–3.0)
Eosinophils Absolute: 0.3 10*3/uL (ref 0.0–0.7)
Eosinophils Relative: 6 % — ABNORMAL HIGH (ref 0.0–5.0)
HCT: 33.4 % — ABNORMAL LOW (ref 36.0–46.0)
Hemoglobin: 11.2 g/dL — ABNORMAL LOW (ref 12.0–15.0)
Lymphocytes Relative: 32.6 % (ref 12.0–46.0)
Lymphs Abs: 1.4 10*3/uL (ref 0.7–4.0)
MCHC: 33.6 g/dL (ref 30.0–36.0)
MCV: 83.6 fl (ref 78.0–100.0)
Monocytes Absolute: 0.5 10*3/uL (ref 0.1–1.0)
Monocytes Relative: 12.7 % — ABNORMAL HIGH (ref 3.0–12.0)
Neutro Abs: 2 10*3/uL (ref 1.4–7.7)
Neutrophils Relative %: 47.7 % (ref 43.0–77.0)
Platelets: 280 10*3/uL (ref 150.0–400.0)
RBC: 3.99 Mil/uL (ref 3.87–5.11)
RDW: 16.3 % — ABNORMAL HIGH (ref 11.5–15.5)
WBC: 4.2 10*3/uL (ref 4.0–10.5)

## 2024-03-03 LAB — COMPREHENSIVE METABOLIC PANEL WITH GFR
ALT: 36 U/L — ABNORMAL HIGH (ref 3–35)
AST: 26 U/L (ref 5–37)
Albumin: 3.8 g/dL (ref 3.5–5.2)
Alkaline Phosphatase: 45 U/L (ref 39–117)
BUN: 13 mg/dL (ref 6–23)
CO2: 29 meq/L (ref 19–32)
Calcium: 9 mg/dL (ref 8.4–10.5)
Chloride: 104 meq/L (ref 96–112)
Creatinine, Ser: 0.86 mg/dL (ref 0.40–1.20)
GFR: 84.71 mL/min
Glucose, Bld: 68 mg/dL — ABNORMAL LOW (ref 70–99)
Potassium: 3.9 meq/L (ref 3.5–5.1)
Sodium: 140 meq/L (ref 135–145)
Total Bilirubin: 0.3 mg/dL (ref 0.2–1.2)
Total Protein: 6.8 g/dL (ref 6.0–8.3)

## 2024-03-03 LAB — LIPID PANEL
Cholesterol: 192 mg/dL (ref 28–200)
HDL: 56.9 mg/dL
LDL Cholesterol: 114 mg/dL — ABNORMAL HIGH (ref 10–99)
NonHDL: 134.8
Total CHOL/HDL Ratio: 3
Triglycerides: 102 mg/dL (ref 10.0–149.0)
VLDL: 20.4 mg/dL (ref 0.0–40.0)

## 2024-03-03 LAB — HEMOGLOBIN A1C: Hgb A1c MFr Bld: 5.1 % (ref 4.6–6.5)

## 2024-03-03 MED ORDER — WEGOVY 0.25 MG/0.5ML ~~LOC~~ SOAJ: 0.2500 mg | SUBCUTANEOUS | 0 refills | Status: AC

## 2024-03-03 NOTE — Progress Notes (Signed)
 "    Established Patient Office Visit     CC/Reason for Visit: Follow-up chronic medical conditions  HPI: Amanda Wise is a 39 y.o. female who is coming in today for the above mentioned reasons. Past Medical History is significant for: Hypertension, hyperlipidemia, multiple sclerosis followed by neurology, morbid obesity and vitamin D  deficiency.  Has been feeling well.  She unfortunately lost her job and with that her insurance last year and has been off her medication especially the Wegovy  since September.  She has had a 30 pound weight gain since then.  She is wanting to go back on medication.   Past Medical/Surgical History: Past Medical History:  Diagnosis Date   Anemia    Heart palpitations    Hypertension    MS (multiple sclerosis)    Pregnancy induced hypertension     Past Surgical History:  Procedure Laterality Date   NO PAST SURGERIES      Social History:  reports that she has never smoked. She has never used smokeless tobacco. She reports that she does not drink alcohol and does not use drugs.  Allergies: Allergies[1]  Family History:  Family History  Problem Relation Age of Onset   Diabetes Father    High blood pressure Father    Hypertension Maternal Grandmother    Hypertension Paternal Grandmother    Hypertension Other    Multiple sclerosis Neg Hx     Current Medications[2]  Review of Systems:  Negative unless indicated in HPI.   Physical Exam: Vitals:   03/03/24 1029  BP: 120/70  Pulse: 90  Temp: 97.9 F (36.6 C)  TempSrc: Oral  SpO2: 99%  Weight: 233 lb 3.2 oz (105.8 kg)    Body mass index is 37.08 kg/m.   Physical Exam Vitals reviewed.  Constitutional:      Appearance: Normal appearance. She is obese.  HENT:     Head: Normocephalic and atraumatic.  Eyes:     Conjunctiva/sclera: Conjunctivae normal.  Cardiovascular:     Rate and Rhythm: Normal rate and regular rhythm.  Pulmonary:     Effort: Pulmonary effort is  normal.     Breath sounds: Normal breath sounds.  Skin:    General: Skin is warm and dry.  Neurological:     General: No focal deficit present.     Mental Status: She is alert and oriented to person, place, and time.  Psychiatric:        Mood and Affect: Mood normal.        Behavior: Behavior normal.        Thought Content: Thought content normal.        Judgment: Judgment normal.      Impression and Plan:  Transaminitis Assessment & Plan: Check LFTs today.   Mixed hyperlipidemia Assessment & Plan: Check lipids today to see if we need to restart atorvastatin .  Orders: -     Lipid panel; Future  Essential hypertension Assessment & Plan: Well-controlled.  Orders: -     CBC with Differential/Platelet; Future -     Comprehensive metabolic panel with GFR; Future  Morbid obesity (HCC) Assessment & Plan: -Discussed healthy lifestyle, including increased physical activity and better food choices to promote weight loss. - Will see if Medicare will approve Wegovy .  If not I have informed her of the cash prices that are available currently.  Orders: -     Hemoglobin A1c; Future -     Wegovy ; Inject 0.25 mg into the skin once a  week.  Dispense: 2 mL; Refill: 0     Time spent:32 minutes reviewing chart, interviewing and examining patient and formulating plan of care.     Tully Theophilus Andrews, MD [place] [facility]     [1] No Known Allergies [2]  Current Outpatient Medications:    semaglutide -weight management (WEGOVY ) 0.25 MG/0.5ML SOAJ SQ injection, Inject 0.25 mg into the skin once a week., Disp: 2 mL, Rfl: 0   triamcinolone  cream (KENALOG ) 0.1 %, Apply 1 Application topically 2 (two) times daily., Disp: 453 g, Rfl: 0  "

## 2024-03-03 NOTE — Telephone Encounter (Signed)
 Patient called with new insurance information, do not have card just the member ID [employer] Member ID: [id number]   Transferring patient to infusion.

## 2024-03-03 NOTE — Assessment & Plan Note (Signed)
 Well controlled

## 2024-03-03 NOTE — Assessment & Plan Note (Signed)
-  Discussed healthy lifestyle, including increased physical activity and better food choices to promote weight loss. - Will see if Medicare will approve Wegovy .  If not I have informed her of the cash prices that are available currently.

## 2024-03-03 NOTE — Assessment & Plan Note (Signed)
 Check LFT's today.

## 2024-03-03 NOTE — Assessment & Plan Note (Signed)
 Check lipids today to see if we need to restart atorvastatin .

## 2024-03-06 ENCOUNTER — Encounter: Payer: Self-pay | Admitting: Internal Medicine

## 2024-03-09 ENCOUNTER — Ambulatory Visit: Admitting: Internal Medicine

## 2024-03-12 ENCOUNTER — Ambulatory Visit: Payer: Self-pay | Admitting: Internal Medicine
